# Patient Record
Sex: Male | Born: 1985
Health system: Southern US, Community
[De-identification: ages and names within clinical notes are randomized; demographics above are authoritative.]

## PROBLEM LIST (undated history)

## (undated) DIAGNOSIS — I10 Essential (primary) hypertension: Secondary | ICD-10-CM

## (undated) DIAGNOSIS — E669 Obesity, unspecified: Secondary | ICD-10-CM

## (undated) DIAGNOSIS — I428 Other cardiomyopathies: Secondary | ICD-10-CM

## (undated) DIAGNOSIS — G4733 Obstructive sleep apnea (adult) (pediatric): Secondary | ICD-10-CM

## (undated) DIAGNOSIS — Z720 Tobacco use: Secondary | ICD-10-CM

## (undated) DIAGNOSIS — F32A Depression, unspecified: Secondary | ICD-10-CM

## (undated) DIAGNOSIS — E785 Hyperlipidemia, unspecified: Secondary | ICD-10-CM

## (undated) HISTORY — DX: Obesity, unspecified: E66.9

## (undated) HISTORY — DX: Tobacco use: Z72.0

## (undated) HISTORY — DX: Other cardiomyopathies: I42.8

## (undated) HISTORY — DX: Hyperlipidemia, unspecified: E78.5

## (undated) HISTORY — PX: OTHER SURGICAL HISTORY: SHX169

## (undated) HISTORY — PX: NO PAST SURGERIES: SHX2092

---

## 2013-09-14 ENCOUNTER — Encounter (HOSPITAL_COMMUNITY): Payer: Self-pay | Admitting: Emergency Medicine

## 2013-09-14 ENCOUNTER — Emergency Department (HOSPITAL_COMMUNITY)
Admission: EM | Admit: 2013-09-14 | Discharge: 2013-09-14 | Disposition: A | Discharge status: Home / Self Care | Payer: Medicaid Other | Attending: Emergency Medicine | Admitting: Emergency Medicine

## 2013-09-14 DIAGNOSIS — Y9389 Activity, other specified: Secondary | ICD-10-CM | POA: Insufficient documentation

## 2013-09-14 DIAGNOSIS — Y92410 Unspecified street and highway as the place of occurrence of the external cause: Secondary | ICD-10-CM | POA: Insufficient documentation

## 2013-09-14 DIAGNOSIS — S335XXA Sprain of ligaments of lumbar spine, initial encounter: Secondary | ICD-10-CM | POA: Insufficient documentation

## 2013-09-14 DIAGNOSIS — F17200 Nicotine dependence, unspecified, uncomplicated: Secondary | ICD-10-CM | POA: Insufficient documentation

## 2013-09-14 DIAGNOSIS — S39012A Strain of muscle, fascia and tendon of lower back, initial encounter: Secondary | ICD-10-CM

## 2013-09-14 MED ORDER — IBUPROFEN 400 MG PO TABS
800.0000 mg | ORAL_TABLET | Freq: Once | ORAL | Status: AC
  Administered 2013-09-14: 800 mg via ORAL
  Filled 2013-09-14: qty 2
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
## 2013-09-14 NOTE — ED Provider Notes (Signed)
Medical screening examination/treatment/procedure(s) were performed by non-physician practitioner and as supervising physician I was immediately available for consultation/collaboration.   EKG Interpretation None       Juliet Rude. Rubin Payor, MD 09/14/13 2328
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
## 2013-11-21 ENCOUNTER — Emergency Department (HOSPITAL_COMMUNITY): Payer: Medicaid Other

## 2013-11-21 ENCOUNTER — Encounter (HOSPITAL_COMMUNITY): Payer: Self-pay | Admitting: Emergency Medicine

## 2013-11-21 ENCOUNTER — Emergency Department (HOSPITAL_COMMUNITY)
Admission: EM | Admit: 2013-11-21 | Discharge: 2013-11-21 | Disposition: A | Discharge status: Home / Self Care | Payer: Medicaid Other | Attending: Emergency Medicine | Admitting: Emergency Medicine

## 2013-11-21 DIAGNOSIS — W268XXA Contact with other sharp object(s), not elsewhere classified, initial encounter: Secondary | ICD-10-CM | POA: Diagnosis not present

## 2013-11-21 DIAGNOSIS — F17200 Nicotine dependence, unspecified, uncomplicated: Secondary | ICD-10-CM | POA: Diagnosis not present

## 2013-11-21 DIAGNOSIS — Y9289 Other specified places as the place of occurrence of the external cause: Secondary | ICD-10-CM | POA: Insufficient documentation

## 2013-11-21 DIAGNOSIS — S61209A Unspecified open wound of unspecified finger without damage to nail, initial encounter: Secondary | ICD-10-CM

## 2013-11-21 DIAGNOSIS — Y9339 Activity, other involving climbing, rappelling and jumping off: Secondary | ICD-10-CM | POA: Diagnosis not present

## 2013-11-21 DIAGNOSIS — Z23 Encounter for immunization: Secondary | ICD-10-CM | POA: Insufficient documentation

## 2013-11-21 MED ORDER — TETANUS-DIPHTH-ACELL PERTUSSIS 5-2.5-18.5 LF-MCG/0.5 IM SUSP
0.5000 mL | Freq: Once | INTRAMUSCULAR | Status: AC
  Administered 2013-11-21: 0.5 mL via INTRAMUSCULAR
  Filled 2013-11-21: qty 0.5

## 2013-11-21 MED ORDER — TRAMADOL HCL 50 MG PO TABS
50.0000 mg | ORAL_TABLET | Freq: Once | ORAL | Status: AC
  Administered 2013-11-21: 50 mg via ORAL
  Filled 2013-11-21: qty 1

## 2013-11-21 NOTE — Discharge Instructions (Signed)
You may take acetaminophen and ibuprofen as needed for pain. See below for further instructions.  °

## 2013-11-21 NOTE — ED Provider Notes (Signed)
CSN: [id number]     Arrival date & time 11/21/13  1410 History   First MD Initiated Contact with Patient 11/21/13 1510     Chief Complaint  Patient presents with  . Laceration     (Consider location/radiation/quality/duration/timing/severity/associated sxs/prior Treatment) HPI Pt is a 28yo male presenting to ED with complaints of pain and wound to distal left middle finger after trying to climb over a chain-linked fence around 1am this morning. Pain is sharp, constant, 10/10, worse with palpation. States bleeding was easily controlled. Wound was cleaned with peroxide which made pain worse.  Pt states his mother advised him to come to ED for tetanus shot.  Denies fever, n/v/d. Pt is unsure if any foreign bodies from fence are still in his finger.   History reviewed. No pertinent past medical history. History reviewed. No pertinent past surgical history. History reviewed. No pertinent family history. History  Substance Use Topics  . Smoking status: Current Every Day Smoker    Types: Cigarettes  . Smokeless tobacco: Not on file  . Alcohol Use: Yes     Comment: occ    Review of Systems  Constitutional: Negative for fever and chills.  Gastrointestinal: Negative for nausea and vomiting.  Skin: Positive for wound ( distal left middle finger). Negative for color change.  All other systems reviewed and are negative.     Allergies  Bee venom  Home Medications   Prior to Admission medications   Not on File   BP 156/108  Pulse 73  Temp(Src) 97.4 F (36.3 C) (Oral)  Resp 18  Ht 5\' 10"  (1.778 m)  Wt 283 lb (128.368 kg)  BMI 40.61 kg/m2  SpO2 99% Physical Exam  Nursing note and vitals reviewed. Constitutional: He is oriented to person, place, and time. He appears well-developed and well-nourished.  HENT:  Head: Normocephalic and atraumatic.  Eyes: EOM are normal.  Neck: Normal range of motion.  Cardiovascular: Normal rate.   Pulses:      Radial pulses are 2+ on the left  side.  Left hand: Cap refill <3 seconds  Pulmonary/Chest: Effort normal.  Musculoskeletal: Normal range of motion.  FROM left middle finger.  Neurological: He is alert and oriented to person, place, and time.  Skin: Skin is warm and dry. There is erythema.  Superficial skin avulsion to volar aspect distal part of left middle finger.  No active bleeding, mild edema with tenderness.   Psychiatric: He has a normal mood and affect. His behavior is normal.    ED Course  Procedures   The wound is cleansed, debrided of foreign material as much as possible, and dressed. The patient is alerted to watch for any signs of infection (redness, pus, pain, increased swelling or fever) and call if such occurs. Home wound care instructions are provided. Tetanus vaccination status reviewed: Tdap given in ED today  Labs Reviewc Labs Reviewed - No data to display  Imaging Review Dg Finger Middle Left  11/21/2013   CLINICAL DATA:  Finger laceration.  EXAM: LEFT MIDDLE FINGER 2+V  COMPARISON:  None.  FINDINGS: No acute osseous or joint abnormality.  No radiopaque foreign body.  IMPRESSION: No acute osseous or joint abnormality.  No radiopaque foreign body.   Electronically Signed   By: Leanna Battles M.D.   On: 11/21/2013 16:04     EKG Interpretation None      MDM   Final diagnoses:  Avulsion of skin of finger, initial encounter  Need for Tdap vaccination  Pt is a 28yo male presenting to ED for wound check and Tdap after cutting his left middle finger on chain fence around 1am this morning.  No active bleeding. FROM left middle finger. Mild edema with tenderness to area of superficial skin avulsion on volar aspect of finger. Will get plain films to ensure no foreign bodies.  Tdap given. Wound care provided. Pt's BP was elevated while in ED. Discussed with pt to f/u with PCP for recheck of BP next week. He may need to be started on BP medication. Return precautions provided. Pt verbalized  understanding and agreement with tx plan.     Junius Finnerrin O'Malley, PA-C 11/21/13 1657

## 2013-11-21 NOTE — ED Notes (Signed)
He was climbing a fence last night and cut the anterior tip of his L middle finger. He is worried he needs a tetanus shot

## 2013-11-23 NOTE — ED Provider Notes (Signed)
Medical screening examination/treatment/procedure(s) were performed by non-physician practitioner and as supervising physician I was immediately available for consultation/collaboration.   EKG Interpretation None        Audree Camel, MD 11/23/13 [phone]

## 2016-06-02 ENCOUNTER — Emergency Department (HOSPITAL_COMMUNITY)
Admission: EM | Admit: 2016-06-02 | Discharge: 2016-06-02 | Disposition: A | Discharge status: Home / Self Care | Payer: Medicaid Other | Attending: Emergency Medicine | Admitting: Emergency Medicine

## 2016-06-02 ENCOUNTER — Encounter (HOSPITAL_COMMUNITY): Payer: Self-pay | Admitting: Emergency Medicine

## 2016-06-02 DIAGNOSIS — F17210 Nicotine dependence, cigarettes, uncomplicated: Secondary | ICD-10-CM | POA: Insufficient documentation

## 2016-06-02 DIAGNOSIS — Z202 Contact with and (suspected) exposure to infections with a predominantly sexual mode of transmission: Secondary | ICD-10-CM | POA: Insufficient documentation

## 2016-06-02 LAB — RAPID HIV SCREEN (HIV 1/2 AB+AG)
HIV 1/2 Antibodies: NONREACTIVE
HIV-1 P24 Antigen - HIV24: NONREACTIVE

## 2016-06-02 NOTE — ED Notes (Signed)
Pt. Stated, I know I have high BP , pt. Does not take any medication for his BP

## 2016-06-02 NOTE — ED Provider Notes (Signed)
[facility]-EMERGENCY DEPT Provider Note   CSN: [id number] Arrival date & time: 06/02/16  1349     History   Chief Complaint Chief Complaint  Patient presents with  . Exposure to STD    HPI Dale Mcintosh is a 31 y.o. male. CC: STD exposure.  HPI:  Pts SO (male) was dx with genital herpes.  Sensation is a symptomatically. He has no discharge. No genital sores or blisters. No history of STDs. He requests testing for his herpes exposure.  History reviewed. No pertinent past medical history.  There are no active problems to display for this patient.   History reviewed. No pertinent surgical history.     Home Medications    Prior to Admission medications   Not on File    Family History No family history on file.  Social History Social History  Substance Use Topics  . Smoking status: Current Every Day Smoker    Types: Cigarettes  . Smokeless tobacco: Current User  . Alcohol use Yes     Comment: occ     Allergies   Bee venom   Review of Systems Review of Systems  Constitutional: Negative for appetite change, chills, diaphoresis, fatigue and fever.  HENT: Negative for mouth sores, sore throat and trouble swallowing.   Eyes: Negative for visual disturbance.  Respiratory: Negative for cough, chest tightness, shortness of breath and wheezing.   Cardiovascular: Negative for chest pain.  Gastrointestinal: Negative for abdominal distention, abdominal pain, diarrhea, nausea and vomiting.  Endocrine: Negative for polydipsia, polyphagia and polyuria.  Genitourinary: Negative for dysuria, frequency and hematuria.  Musculoskeletal: Negative for gait problem.  Skin: Negative for color change, pallor and rash.  Neurological: Negative for dizziness, syncope, light-headedness and headaches.  Hematological: Does not bruise/bleed easily.  Psychiatric/Behavioral: Negative for behavioral problems and confusion.     Physical Exam Updated Vital Signs BP (!) 155/107 (BP  Location: Right Arm)   Pulse 89   Temp 98.2 F (36.8 C) (Oral)   Resp 18   SpO2 97%   Physical Exam  Constitutional: He is oriented to person, place, and time. He appears well-developed and well-nourished. No distress.  HENT:  Head: Normocephalic.  Eyes: Conjunctivae are normal. Pupils are equal, round, and reactive to light. No scleral icterus.  Neck: Normal range of motion. Neck supple. No thyromegaly present.  Cardiovascular: Normal rate and regular rhythm.  Exam reveals no gallop and no friction rub.   No murmur heard. Pulmonary/Chest: Effort normal and breath sounds normal. No respiratory distress. He has no wheezes. He has no rales.  Abdominal: Soft. Bowel sounds are normal. He exhibits no distension. There is no tenderness. There is no rebound.  Musculoskeletal: Normal range of motion.  Neurological: He is alert and oriented to person, place, and time.  Skin: Skin is warm and dry. No rash noted.  Psychiatric: He has a normal mood and affect. His behavior is normal.     ED Treatments / Results  Labs (all labs ordered are listed, but only abnormal results are displayed) Labs Reviewed  HERPES SIMPLEX VIRUS(HSV) DNA BY PCR  HIV ANTIBODY (ROUTINE TESTING)  RAPID HIV SCREEN (HIV 1/2 AB+AG)  RPR  GC/CHLAMYDIA PROBE AMP () NOT AT [facility]    EKG  EKG Interpretation None       Radiology No results found.  Procedures Procedures (including critical care time)  Medications Ordered in ED Medications - No data to display   Initial Impression / Assessment and Plan / ED Course  I have reviewed the triage vital signs and the nursing notes.  Pertinent labs & imaging results that were available during my care of the patient were reviewed by me and considered in my medical decision making (see chart for details).     CG, Chlamydia, Herpes PCA, HIV, and RPR.  Follow-up my chart. He will be contact with any positive results.  Final Clinical Impressions(s) / ED  Diagnoses   Final diagnoses:  STD exposure    New Prescriptions New Prescriptions   No medications on file     Rolland Porter, MD 06/02/16 1658

## 2016-06-02 NOTE — ED Notes (Signed)
Called pt's name to obtain vital signs, no one answered. 

## 2016-06-02 NOTE — ED Notes (Signed)
PT unable to provide UA at this time... 

## 2016-06-02 NOTE — ED Triage Notes (Signed)
Pt. Stated, I don't have any symptoms , I just want to be tested for STD.  My girl tested positive for herpes.

## 2016-06-02 NOTE — Discharge Instructions (Signed)
You can look on "my chart" for your test results in 4-5 days. You will be contacted with any positive results needing attention or treatment. Always wear a condom to prevent spread/contraction of sexually transmitted infections.

## 2016-06-03 LAB — HERPES SIMPLEX VIRUS(HSV) DNA BY PCR
HSV 1 DNA: NEGATIVE
HSV 2 DNA: NEGATIVE

## 2016-06-03 LAB — HIV ANTIBODY (ROUTINE TESTING W REFLEX): HIV Screen 4th Generation wRfx: NONREACTIVE

## 2016-06-03 LAB — SYPHILIS: RPR W/REFLEX TO RPR TITER AND TREPONEMAL ANTIBODIES, TRADITIONAL SCREENING AND DIAGNOSIS ALGORITHM: RPR Ser Ql: NONREACTIVE

## 2016-06-12 ENCOUNTER — Emergency Department (HOSPITAL_COMMUNITY)
Admission: EM | Admit: 2016-06-12 | Discharge: 2016-06-12 | Disposition: A | Discharge status: Home / Self Care | Payer: Self-pay | Attending: Emergency Medicine | Admitting: Emergency Medicine

## 2016-06-12 ENCOUNTER — Encounter (HOSPITAL_COMMUNITY): Payer: Self-pay | Admitting: Emergency Medicine

## 2016-06-12 DIAGNOSIS — Y929 Unspecified place or not applicable: Secondary | ICD-10-CM | POA: Insufficient documentation

## 2016-06-12 DIAGNOSIS — S20229A Contusion of unspecified back wall of thorax, initial encounter: Secondary | ICD-10-CM

## 2016-06-12 DIAGNOSIS — Y999 Unspecified external cause status: Secondary | ICD-10-CM | POA: Insufficient documentation

## 2016-06-12 DIAGNOSIS — W01198A Fall on same level from slipping, tripping and stumbling with subsequent striking against other object, initial encounter: Secondary | ICD-10-CM | POA: Insufficient documentation

## 2016-06-12 DIAGNOSIS — Z79899 Other long term (current) drug therapy: Secondary | ICD-10-CM | POA: Insufficient documentation

## 2016-06-12 DIAGNOSIS — I10 Essential (primary) hypertension: Secondary | ICD-10-CM | POA: Insufficient documentation

## 2016-06-12 DIAGNOSIS — F17210 Nicotine dependence, cigarettes, uncomplicated: Secondary | ICD-10-CM | POA: Insufficient documentation

## 2016-06-12 DIAGNOSIS — W19XXXA Unspecified fall, initial encounter: Secondary | ICD-10-CM

## 2016-06-12 DIAGNOSIS — S300XXA Contusion of lower back and pelvis, initial encounter: Secondary | ICD-10-CM | POA: Insufficient documentation

## 2016-06-12 DIAGNOSIS — Y9301 Activity, walking, marching and hiking: Secondary | ICD-10-CM | POA: Insufficient documentation

## 2016-06-12 HISTORY — DX: Essential (primary) hypertension: I10

## 2016-06-12 MED ORDER — METHOCARBAMOL 500 MG PO TABS: 500.0000 mg | ORAL_TABLET | Freq: Two times a day (BID) | ORAL | 0 refills | Status: DC

## 2016-06-12 NOTE — ED Provider Notes (Signed)
[facility]-EMERGENCY DEPT Provider Note   CSN: [id number] Arrival date & time: 06/12/16  1215  By signing my name below, I, Majel Homer, attest that this documentation has been prepared under the direction and in the presence of Sharen Heck, PA-C . Electronically Signed: Majel Homer, Scribe. 06/12/2016. 1:26 PM.  History   Chief Complaint Chief Complaint  Patient presents with  . Back Pain   The history is provided by the patient. No language interpreter was used.   HPI Comments: Dale Mcintosh is a 31 y.o. male with PMHx of HTN, who presents to the Emergency Department complaining of acute onset lower back pain s/p a mechanical fall that occurred this morning. Pt reports he was walking down the steps of his porch this morning when he suddenly slipped and fell backwards, causing him to strike his lower back on the bottom step. He notes he was able to stand up on his own and ambulate after this incident. He denies hitting his head on anything, loss of consciousness, any numbness or weakness in his extremities, and bilateral hip pain. No previous back injuries or surgeries. No anticoagulants.   Past Medical History:  Diagnosis Date  . Hypertension    There are no active problems to display for this patient.  History reviewed. No pertinent surgical history.  Home Medications    Prior to Admission medications   Medication Sig Start Date End Date Taking? Authorizing Provider  methocarbamol (ROBAXIN) 500 MG tablet Take 1 tablet (500 mg total) by mouth 2 (two) times daily. 06/12/16   Liberty Handy, PA-C    Family History History reviewed. No pertinent family history.  Social History Social History  Substance Use Topics  . Smoking status: Current Every Day Smoker    Packs/day: 0.50    Types: Cigarettes  . Smokeless tobacco: Current User  . Alcohol use Yes     Comment: occ   Allergies   Bee venom  Review of Systems Review of Systems  Musculoskeletal: Positive for back pain.  Negative for arthralgias.  Neurological: Negative for weakness and numbness.   Physical Exam Updated Vital Signs BP 174/94 (BP Location: Left Arm)   Pulse 89   Temp 98 F (36.7 C) (Oral)   Resp 18   Ht 5' 7.5" (1.715 m)   Wt 298 lb (135.2 kg)   SpO2 98%   BMI 45.98 kg/m   Physical Exam  Constitutional: He is oriented to person, place, and time. He appears well-developed and well-nourished.  HENT:  Head: Normocephalic.  Eyes: EOM are normal.  Neck: Normal range of motion.  Pulmonary/Chest: Effort normal.  Abdominal: He exhibits no distension.  Musculoskeletal: Normal range of motion. He exhibits tenderness.  Gait normal.  Full active CTL spine ROM including flexion, extension, lateral bend and rotation. No CTL spine midline tenderness.  There is lumbar paraspinal tenderness. SI joints and sciatic notch non tender.  Full passive hip, knee and ankle ROM bilaterally.  Negative SLR. Negative Faber. Negative Stinchfield test.  Bilateral lower paraspinal muscle tenderness.   Neurological: He is alert and oriented to person, place, and time.  Psychiatric: He has a normal mood and affect.  Nursing note and vitals reviewed.  ED Treatments / Results  DIAGNOSTIC STUDIES:  Oxygen Saturation is 98% on RA, normal by my interpretation.    COORDINATION OF CARE:  1:25 PM Discussed treatment plan with pt at bedside and pt agreed to plan.  Labs (all labs ordered are listed, but only abnormal results are displayed)  Labs Reviewed - No data to display  EKG  EKG Interpretation None       Radiology No results found.  Procedures Procedures (including critical care time)  Medications Ordered in ED Medications - No data to display  Initial Impression / Assessment and Plan / ED Course  I have reviewed the triage vital signs and the nursing notes.  Pertinent labs & imaging results that were available during my care of the patient were reviewed by me and considered in my medical  decision making (see chart for details).     31 yo male with no pertinent pmh presents with acute onset lumbar back pain s/p low risk mechanical fall this morning.  Pt slipped off second step and hit his lower back on first step of his porch.  No LOC, no head trauma.  On exam there is paraspinal lumbar tenderness most consistent with contusion.  No CTL spine midline tenderness. Neurological exam of lower extremities normal. Pt ambulatory without antalgic gait in ED with normal ROM of lower extremities.  No imaging indicated at this time.  Will d/c with symptomatic tx.  ED return precautions given.   I personally performed the services described in this documentation, which was scribed in my presence. The recorded information has been reviewed and is accurate.   Final Clinical Impressions(s) / ED Diagnoses   Final diagnoses:  Fall, initial encounter  Contusion of back, unspecified laterality, initial encounter    New Prescriptions New Prescriptions   METHOCARBAMOL (ROBAXIN) 500 MG TABLET    Take 1 tablet (500 mg total) by mouth 2 (two) times daily.     Liberty Handy, PA-C 06/12/16 1350    Gerhard Munch, MD 06/13/16 1426

## 2016-06-12 NOTE — ED Triage Notes (Signed)
Patient reports he slipped and fell and injured his left lower back.  Denies head injury, LOC.

## 2016-06-12 NOTE — Discharge Instructions (Signed)
Your back pain is most likely due to a contusion from your recent fall.  Please read attached information on contusion and back pain.   You may take 650 mg tylenol + 600 mg of ibuprofen three times a day for pain and inflammation for the next 3-4 days.  Please ice your back and rest for the next 2 days, on day 3 you may start heating your back and doing mild back stretches and massage.  You have been prescribed robaxin (a muscle relaxer) which you can start using if you notice your back is tight or if you're having back spasms.   Your symptoms should resolve or at least improve in the next 5-7 days.  Return to the emergency department if you notice numbness, tingling or weakness in your lower extremities.

## 2016-09-19 ENCOUNTER — Encounter (HOSPITAL_COMMUNITY): Payer: Self-pay | Admitting: Emergency Medicine

## 2016-09-19 ENCOUNTER — Emergency Department (HOSPITAL_COMMUNITY)
Admission: EM | Admit: 2016-09-19 | Discharge: 2016-09-19 | Disposition: A | Discharge status: Home / Self Care | Payer: Worker's Compensation | Attending: Emergency Medicine | Admitting: Emergency Medicine

## 2016-09-19 DIAGNOSIS — Y990 Civilian activity done for income or pay: Secondary | ICD-10-CM | POA: Diagnosis not present

## 2016-09-19 DIAGNOSIS — Y929 Unspecified place or not applicable: Secondary | ICD-10-CM | POA: Diagnosis not present

## 2016-09-19 DIAGNOSIS — W208XXA Other cause of strike by thrown, projected or falling object, initial encounter: Secondary | ICD-10-CM | POA: Insufficient documentation

## 2016-09-19 DIAGNOSIS — I10 Essential (primary) hypertension: Secondary | ICD-10-CM

## 2016-09-19 DIAGNOSIS — Y939 Activity, unspecified: Secondary | ICD-10-CM | POA: Insufficient documentation

## 2016-09-19 DIAGNOSIS — S0990XA Unspecified injury of head, initial encounter: Secondary | ICD-10-CM | POA: Diagnosis not present

## 2016-09-19 DIAGNOSIS — F17210 Nicotine dependence, cigarettes, uncomplicated: Secondary | ICD-10-CM | POA: Diagnosis not present

## 2016-09-19 LAB — I-STAT CHEM 8, ED
BUN: 20 mg/dL (ref 6–20)
Calcium, Ion: 1.22 mmol/L (ref 1.15–1.40)
Chloride: 103 mmol/L (ref 101–111)
Creatinine, Ser: 1.1 mg/dL (ref 0.61–1.24)
Glucose, Bld: 99 mg/dL (ref 65–99)
HEMATOCRIT: 43 % (ref 39.0–52.0)
HEMOGLOBIN: 14.6 g/dL (ref 13.0–17.0)
Potassium: 3.7 mmol/L (ref 3.5–5.1)
SODIUM: 142 mmol/L (ref 135–145)
TCO2: 29 mmol/L (ref 0–100)

## 2016-09-19 MED ORDER — AMLODIPINE BESYLATE 5 MG PO TABS: 5.0000 mg | ORAL_TABLET | Freq: Every day | ORAL | 0 refills | Status: DC

## 2016-09-19 MED ORDER — CLONIDINE HCL 0.1 MG PO TABS
0.2000 mg | ORAL_TABLET | Freq: Once | ORAL | Status: AC
  Administered 2016-09-19: 0.2 mg via ORAL
  Filled 2016-09-19: qty 2

## 2016-09-19 NOTE — ED Provider Notes (Signed)
[facility] Provider Note   CSN: [id number] Arrival date & time: 09/19/16  1329     History   Chief Complaint Chief Complaint  Patient presents with  . Head Injury  . Nausea    HPI Antwone Cogbill is a 31 y.o. male.  31 year old male who is here after having boxes at work fall on top of his head. No loss of consciousness. He does not take any blood thinners. He denies any neck pain. Did have some nausea originally but has not had any vomiting. No ataxia. Denied any swelling to the top of his head with a boxes hit it. Was encouraged to come here by his recent employment.      Past Medical History:  Diagnosis Date  . Hypertension     There are no active problems to display for this patient.   History reviewed. No pertinent surgical history.     Home Medications    Prior to Admission medications   Medication Sig Start Date End Date Taking? Authorizing Provider  methocarbamol (ROBAXIN) 500 MG tablet Take 1 tablet (500 mg total) by mouth 2 (two) times daily. Patient not taking: Reported on 09/19/2016 06/12/16   Liberty Handy, PA-C    Family History No family history on file.  Social History Social History  Substance Use Topics  . Smoking status: Current Every Day Smoker    Packs/day: 0.50    Types: Cigarettes  . Smokeless tobacco: Current User  . Alcohol use Yes     Comment: occ     Allergies   Bee venom   Review of Systems Review of Systems  All other systems reviewed and are negative.    Physical Exam Updated Vital Signs BP (!) 203/148 (BP Location: Left Arm)   Pulse 69   Temp 98.3 F (36.8 C) (Oral)   Resp 18   Ht 5' 10.5" (1.791 m)   Wt 299 lb 3.2 oz (135.7 kg)   SpO2 98%   BMI 42.32 kg/m   Physical Exam  Constitutional: He is oriented to person, place, and time. He appears well-developed and well-nourished.  Non-toxic appearance. No distress.  HENT:  Head: Normocephalic and atraumatic.  Eyes: Conjunctivae, EOM and lids  are normal. Pupils are equal, round, and reactive to light.  Neck: Normal range of motion. Neck supple. No tracheal deviation present. No thyroid mass present.  Cardiovascular: Normal rate, regular rhythm and normal heart sounds.  Exam reveals no gallop.   No murmur heard. Pulmonary/Chest: Effort normal and breath sounds normal. No stridor. No respiratory distress. He has no decreased breath sounds. He has no wheezes. He has no rhonchi. He has no rales.  Abdominal: Soft. Normal appearance and bowel sounds are normal. He exhibits no distension. There is no tenderness. There is no rebound and no CVA tenderness.  Musculoskeletal: Normal range of motion. He exhibits no edema or tenderness.  Neurological: He is alert and oriented to person, place, and time. He has normal strength. No cranial nerve deficit or sensory deficit. Coordination and gait normal. GCS eye subscore is 4. GCS verbal subscore is 5. GCS motor subscore is 6.  Skin: Skin is warm and dry. No abrasion and no rash noted.  Psychiatric: He has a normal mood and affect. His speech is normal and behavior is normal.  Nursing note and vitals reviewed.    ED Treatments / Results  Labs (all labs ordered are listed, but only abnormal results are displayed) Labs Reviewed - No data to display  EKG  EKG Interpretation None       Radiology No results found.  Procedures Procedures (including critical care time)  Medications Ordered in ED Medications - No data to display   Initial Impression / Assessment and Plan / ED Course  I have reviewed the triage vital signs and the nursing notes.  Pertinent labs & imaging results that were available during my care of the patient were reviewed by me and considered in my medical decision making (see chart for details).    Patient has no signs of head injury at this time. No indication for intracranial imaging. Patient's blood pressure noted here and treated with clonidine. Get improvement  with it and will place patient on Norvasc and give referral for primary care  Final Clinical Impressions(s) / ED Diagnoses   Final diagnoses:  None    New Prescriptions New Prescriptions   No medications on file     Lorre Nick, MD 09/19/16 2103

## 2016-09-19 NOTE — ED Triage Notes (Signed)
Patient here with complaints of head injury at work. Reports that a box fell on his head, immediate nausea. No vomiting. No LOC.

## 2016-12-28 ENCOUNTER — Encounter (HOSPITAL_COMMUNITY): Payer: Self-pay | Admitting: Emergency Medicine

## 2016-12-28 ENCOUNTER — Emergency Department (HOSPITAL_COMMUNITY)
Admission: EM | Admit: 2016-12-28 | Discharge: 2016-12-28 | Disposition: A | Discharge status: Home / Self Care | Payer: Self-pay | Attending: Emergency Medicine | Admitting: Emergency Medicine

## 2016-12-28 ENCOUNTER — Emergency Department (HOSPITAL_COMMUNITY): Payer: Self-pay

## 2016-12-28 DIAGNOSIS — I10 Essential (primary) hypertension: Secondary | ICD-10-CM | POA: Insufficient documentation

## 2016-12-28 DIAGNOSIS — F17210 Nicotine dependence, cigarettes, uncomplicated: Secondary | ICD-10-CM | POA: Insufficient documentation

## 2016-12-28 DIAGNOSIS — R0789 Other chest pain: Secondary | ICD-10-CM

## 2016-12-28 DIAGNOSIS — I4581 Long QT syndrome: Secondary | ICD-10-CM | POA: Insufficient documentation

## 2016-12-28 LAB — CBC
HEMATOCRIT: 41.5 % (ref 39.0–52.0)
Hemoglobin: 13.5 g/dL (ref 13.0–17.0)
MCH: 27.8 pg (ref 26.0–34.0)
MCHC: 32.5 g/dL (ref 30.0–36.0)
MCV: 85.4 fL (ref 78.0–100.0)
PLATELETS: 198 10*3/uL (ref 150–400)
RBC: 4.86 MIL/uL (ref 4.22–5.81)
RDW: 14.9 % (ref 11.5–15.5)
WBC: 9 10*3/uL (ref 4.0–10.5)

## 2016-12-28 LAB — BASIC METABOLIC PANEL
Anion gap: 7 (ref 5–15)
BUN: 18 mg/dL (ref 6–20)
CO2: 27 mmol/L (ref 22–32)
CREATININE: 1.02 mg/dL (ref 0.61–1.24)
Calcium: 9 mg/dL (ref 8.9–10.3)
Chloride: 104 mmol/L (ref 101–111)
GFR calc Af Amer: >60 mL/min (ref >60)
GFR calc non Af Amer: >60 mL/min (ref >60)
Glucose, Bld: 95 mg/dL (ref 65–99)
Potassium: 3.7 mmol/L (ref 3.5–5.1)
SODIUM: 138 mmol/L (ref 135–145)

## 2016-12-28 LAB — POCT I-STAT TROPONIN I: Troponin i, poc: 0.01 ng/mL (ref 0.00–0.08)

## 2016-12-28 MED ORDER — AMLODIPINE BESYLATE 5 MG PO TABS: 5.0000 mg | ORAL_TABLET | Freq: Every day | ORAL | 0 refills | Status: DC
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
## 2016-12-28 NOTE — ED Notes (Signed)
Bed: WA20 Expected date:  Expected time:  Means of arrival:  Comments: 

## 2016-12-28 NOTE — ED Triage Notes (Signed)
Pt reports mid CP since this am. No SOB or dizziness. Pt reports last time this happened he had LOC. No LOC today.

## 2016-12-28 NOTE — ED Provider Notes (Signed)
[facility] Provider Note   CSN: [id number] Arrival date & time: 12/28/16  1025     History   Chief Complaint Chief Complaint  Patient presents with  . Chest Pain    HPI Dale Mcintosh is a 31 y.o. male with a history of hypertension (not taking medications) who presents for evaluation of chest pain.  He reports that his chest pain was present for "a few hours" and gradually got better.  His chest pain occurred while he was laying in bed.  He could not find anything to make it better or worse.  He had a similar episode about one month ago when he felt the same chest pain for "a few seconds" then had a syncopal event and woke up on the floor after about 30 seconds.  He reports that while waiting his chest pain gradually got better.  He reports that he has never had an EKG before and he did not seek treatment when he had his previous syncopal event.   HPI  Past Medical History:  Diagnosis Date  . Hypertension     There are no active problems to display for this patient.   History reviewed. No pertinent surgical history.     Home Medications    Prior to Admission medications   Medication Sig Start Date End Date Taking? Authorizing Provider  aspirin-acetaminophen-caffeine (EXCEDRIN MIGRAINE) [phone] MG tablet Take 2 tablets by mouth every 6 (six) hours as needed for headache.   Yes [provider]  amLODipine (NORVASC) 5 MG tablet Take 1 tablet (5 mg total) by mouth daily. 12/28/16   Cristina Gong, PA-C  methocarbamol (ROBAXIN) 500 MG tablet Take 1 tablet (500 mg total) by mouth 2 (two) times daily. Patient not taking: Reported on 09/19/2016 06/12/16   Liberty Handy, PA-C    Family History History reviewed. No pertinent family history.  Social History Social History  Substance Use Topics  . Smoking status: Current Every Day Smoker    Packs/day: 0.50    Types: Cigarettes  . Smokeless tobacco: Current User  . Alcohol use Yes     Comment: occ       Allergies   Bee venom   Review of Systems Review of Systems  Constitutional: Negative for chills and fever.  HENT: Negative for ear pain and sore throat.   Eyes: Negative for pain and visual disturbance.  Respiratory: Negative for cough, chest tightness and shortness of breath.   Cardiovascular: Positive for chest pain (Has resolved). Negative for palpitations.  Gastrointestinal: Negative for abdominal pain and vomiting.  Genitourinary: Negative for dysuria and hematuria.  Musculoskeletal: Negative for arthralgias and back pain.  Skin: Negative for color change and rash.  Neurological: Negative for seizures, syncope, light-headedness and headaches.  All other systems reviewed and are negative.    Physical Exam Updated Vital Signs BP (!) 157/107 (BP Location: Left Arm)   Pulse 73   Temp 98.1 F (36.7 C) (Oral)   Resp 14   SpO2 100%   Physical Exam  Constitutional: He appears well-developed and well-nourished. No distress.  HENT:  Head: Normocephalic and atraumatic.  Eyes: Conjunctivae are normal. No scleral icterus.  Neck: Normal range of motion. No JVD present. No tracheal deviation present.  Cardiovascular: Normal rate, regular rhythm, S1 normal, S2 normal, normal heart sounds, intact distal pulses and normal pulses.  Exam reveals no gallop and no friction rub.   No murmur heard. Pulmonary/Chest: Effort normal and breath sounds normal. No stridor. No respiratory  distress. He has no wheezes. He has no rales. He exhibits no tenderness.  Abdominal: Soft. Bowel sounds are normal. He exhibits no distension. There is no tenderness.  Musculoskeletal: He exhibits no edema or deformity.  Neurological: He is alert. He exhibits normal muscle tone.  Skin: Skin is warm and dry. He is not diaphoretic.  Psychiatric: He has a normal mood and affect. His behavior is normal.  Nursing note and vitals reviewed.    ED Treatments / Results  Labs (all labs ordered are listed, but  only abnormal results are displayed) Labs Reviewed  BASIC METABOLIC PANEL  CBC  I-STAT TROPONIN, ED  POCT I-STAT TROPONIN I    EKG  EKG Interpretation  Date/Time:  Saturday December 28 2016 10:50:19 EDT Ventricular Rate:  88 PR Interval:    QRS Duration: 88 QT Interval:  415 QTC Calculation: 503 R Axis:   48 Text Interpretation:  Sinus rhythm Probable left atrial enlargement Prolonged QT interval No old tracing to compare Confirmed by Doug Sou [phone]) on 12/28/2016 10:52:32 AM Also confirmed by Doug Sou [phone]), editor Misty Stanley [phone])  on 12/28/2016 11:30:14 AM       Radiology Dg Chest 2 View  Result Date: 12/28/2016 CLINICAL DATA:  Acute onset chest pain this morning.  Hypertension. EXAM: CHEST  2 VIEW COMPARISON:  None. FINDINGS: The heart size and mediastinal contours are within normal limits. Both lungs are clear. The visualized skeletal structures are unremarkable. IMPRESSION: Negative.  No active cardiopulmonary disease. Electronically Signed   By: Myles Rosenthal M.D.   On: 12/28/2016 11:57    Procedures Procedures (including critical care time)  Medications Ordered in ED Medications - No data to display   Initial Impression / Assessment and Plan / ED Course  I have reviewed the triage vital signs and the nursing notes.  Pertinent labs & imaging results that were available during my care of the patient were reviewed by me and considered in my medical decision making (see chart for details).    Patient is to be discharged with recommendation to follow up with Cardiology in regards to today's hospital visit. Chest pain is not likely of cardiac or pulmonary etiology d/t presentation, PERC negative, VSS, no tracheal deviation, no JVD or new murmur, RRR, breath sounds equal bilaterally, EKG without acute abnormalities, Long QT syndrome noted, negative troponin, and negative CXR. Pt has been advised to return to the ED if CP Recurs. Pt appears reliable  for follow up and is agreeable to discharge.  Patient was educated about long QT, medications to avoid, and what the diagnoses is.  He was also educated about HTN and the need to take antihypertensives as rx. Patient was given resource list and instructed to contact wellness center for follow-up appointment.  Case has been discussed with Dr. Rubin Payor who agrees with the above plan to discharge.     Final Clinical Impressions(s) / ED Diagnoses   Final diagnoses:  Essential hypertension  Long Q-T syndrome  Atypical chest pain    New Prescriptions Discharge Medication List as of 12/28/2016  5:15 PM       Cristina Gong, PA-C 12/28/16 2247    Benjiman Core, MD 01/04/17 0010

## 2016-12-28 NOTE — ED Notes (Signed)
EKG completed/documented in triage. [employer]

## 2017-01-30 ENCOUNTER — Encounter: Payer: Self-pay | Admitting: Internal Medicine

## 2017-01-30 ENCOUNTER — Ambulatory Visit (INDEPENDENT_AMBULATORY_CARE_PROVIDER_SITE_OTHER): Payer: Self-pay | Admitting: Internal Medicine

## 2017-01-30 VITALS — BP 178/122 | HR 75 | Ht 70.5 in | Wt 296.4 lb

## 2017-01-30 DIAGNOSIS — R51 Headache: Secondary | ICD-10-CM

## 2017-01-30 DIAGNOSIS — R0683 Snoring: Secondary | ICD-10-CM

## 2017-01-30 DIAGNOSIS — Z79899 Other long term (current) drug therapy: Secondary | ICD-10-CM

## 2017-01-30 DIAGNOSIS — G4719 Other hypersomnia: Secondary | ICD-10-CM

## 2017-01-30 DIAGNOSIS — R519 Headache, unspecified: Secondary | ICD-10-CM

## 2017-01-30 DIAGNOSIS — I10 Essential (primary) hypertension: Secondary | ICD-10-CM

## 2017-01-30 MED ORDER — IRBESARTAN-HYDROCHLOROTHIAZIDE 150-12.5 MG PO TABS: 1.0000 | ORAL_TABLET | Freq: Every day | ORAL | 5 refills | Status: DC

## 2017-01-30 MED ORDER — AMLODIPINE BESYLATE 10 MG PO TABS: 10.0000 mg | ORAL_TABLET | Freq: Every day | ORAL | 5 refills | Status: DC

## 2017-01-30 NOTE — Patient Instructions (Addendum)
Your physician has recommended you make the following change in your medication:  -- increase amlodipine to 10mg  daily -- start irbesartan-hctz 150/12.5mg  once daily  Your physician recommends that you return for lab work in: ONE WEEK (BMET)  Dr. Rennis Golden has requested that you schedule an appointment with one of our clinical pharmacists for a blood pressure check appointment within the next 2-3weeks.  -- if you monitor your blood pressure (BP) at home, please bring your BP cuff and your BP readings with you to this appointment -- please check your BP no more than twice daily, after you have been sitting/resting for 5-10 minutes, at least 1 hour after taking your BP medications  Your physician has recommended that you have a sleep study @ [facility]. This test records several body functions during sleep, including: brain activity, eye movement, oxygen and carbon dioxide blood levels, heart rate and rhythm, breathing rate and rhythm, the flow of air through your mouth and nose, snoring, body muscle movements, and chest and belly movement.  Your physician recommends that you schedule a follow-up appointment in 4-6 weeks with Dr. Rennis Golden.

## 2017-01-31 NOTE — Progress Notes (Signed)
OFFICE NOTE  Chief Complaint:  Chest pain  Primary Care Physician: Patient, No Pcp Per  HPI:  Dale Mcintosh is a 31 y.o. male with a past medial history significant for hypertension. He was seen in the emergency department the end of August for hypertension and chest pain. He was not on medications for his hypertension. He also said that he had a syncopal event, but this is questionable. He was noted to have a long QTC of over 500 ms in the emergency department however repeat EKG today shows a QTC of 422 ms. He denies any palpitations, tachycardia or symptoms of arrhythmia. Blood pressure today however is extremely elevated 178/122. He was a placed on amlodipine 5 mg daily by his primary care provider which she reports compliance with. He denies any further chest pain. He does report excessive daytime sleepiness, snoring and nonrestorative sleep.  PMHx:  Past Medical History:  Diagnosis Date  . Hypertension     History reviewed. No pertinent surgical history.  FAMHx:  Family History  Problem Relation Age of Onset  . Hypertension Father     SOCHx:   reports that he has been smoking Cigarettes.  He has been smoking about 0.50 packs per day. He uses smokeless tobacco. He reports that he drinks alcohol. He reports that he does not use drugs.  ALLERGIES:  Allergies  Allergen Reactions  . Bee Venom Anaphylaxis    ROS: Pertinent items noted in HPI and remainder of comprehensive ROS otherwise negative.  HOME MEDS: Current Outpatient Prescriptions on File Prior to Visit  Medication Sig Dispense Refill  . aspirin-acetaminophen-caffeine (EXCEDRIN MIGRAINE) 250-250-65 MG tablet Take 2 tablets by mouth every 6 (six) hours as needed for headache.     No current facility-administered medications on file prior to visit.     LABS/IMAGING: No results found for this or any previous visit (from the past 48 hour(s)). No results found.  LIPID PANEL: No results found for: CHOL, TRIG,  HDL, CHOLHDL, VLDL, LDLCALC, LDLDIRECT   WEIGHTS: Wt Readings from Last 3 Encounters:  01/30/17 296 lb 6.4 oz (134.4 kg)  09/19/16 299 lb 3.2 oz (135.7 kg)  06/12/16 298 lb (135.2 kg)    VITALS: BP (!) 178/122   Pulse 75   Ht 5' 10.5" (1.791 m)   Wt 296 lb 6.4 oz (134.4 kg)   BMI 41.93 kg/m   EXAM: General appearance: alert and no distress Neck: no carotid bruit, no JVD and thyroid not enlarged, symmetric, no tenderness/mass/nodules Lungs: clear to auscultation bilaterally Heart: regular rate and rhythm, S1, S2 normal, no murmur, click, rub or gallop Abdomen: soft, non-tender; bowel sounds normal; no masses,  no organomegaly Extremities: extremities normal, atraumatic, no cyanosis or edema Pulses: 2+ and symmetric Skin: Skin color, texture, turgor normal. No rashes or lesions Neurologic: Grossly normal Psych: Pleasant  EKG: Normal sinus rhythm at 75, voltage criteria for LVH, nonspecific T-wave changes - personally reviewed  ASSESSMENT: 1. Malignant hypertension 2. Syncope 3. Long QTc  PLAN: 1.   Mr. Iorio had a questionable syncopal event in the setting of malignant hypertension. Blood pressure remains very elevated. QTc was prolonged, however, it is normal today. I do not feel this represents a congenital long QTc syndrome, rather it was acquired. There is no evidence to suggest he had an arrhyhtmia. The main issue is ongoing significant hypertension. Will plan to increase amlodipine to 10 mg daily and irbesartan/hctz 150/12.5 mg daily. Check BMET in 1 week and will have a hypertension  follow-up visit in 2 weeks. I will see him back in 4-6 weeks.  Chrystie Nose, MD, [facility]  [employer] [employer]  [facility]  Attending Cardiologist  Direct Dial: [phone]  Fax: [phone]  Website:  [email or web address] Nicely Cala Kruckenberg 01/31/2017, 5:13 PM

## 2017-02-01 DIAGNOSIS — R0683 Snoring: Secondary | ICD-10-CM | POA: Insufficient documentation

## 2017-02-01 DIAGNOSIS — I161 Hypertensive emergency: Secondary | ICD-10-CM | POA: Insufficient documentation

## 2017-02-01 DIAGNOSIS — G4719 Other hypersomnia: Secondary | ICD-10-CM | POA: Insufficient documentation

## 2017-02-01 DIAGNOSIS — R519 Headache, unspecified: Secondary | ICD-10-CM | POA: Insufficient documentation

## 2017-02-01 DIAGNOSIS — R51 Headache: Secondary | ICD-10-CM

## 2017-02-07 ENCOUNTER — Telehealth: Payer: Self-pay | Admitting: Internal Medicine

## 2017-02-07 NOTE — Telephone Encounter (Signed)
Attempted to complete PA for irbesartan-hctz via [email or web address] and was unable to do so. Called # provided which was to patient's worker's comp insurance. She states pharmacy most likely ran the Rx to wrong insurance.   Called [facility] to notify them that patient's Rx will need to ran w/o insurance, as patient's only insurance listed in worker's comp. Pharmacy staff will contact patient to see if patient any coupons or another insurance

## 2017-02-19 NOTE — Progress Notes (Deleted)
Patient ID: Dale Mcintosh                 DOB: 06/12/1985                      MRN: [id number]     HPI: Dale Mcintosh is a 31 y.o. male referred by Dr. Rennis Golden to HTN clinic. PMH significant for hypertension. Tobacco abuse. Dr Rennis Golden increased amlodipine for 5mg  daily to 10mg  daily during last office visit on 01/30/2017 and added irbesartan/HCTZ 150/12.5mg  daily yo therapy.Patient was to repeat BMET after last office visit but no blood work completed yet.   Current HTN meds:  amlodipine 10mg  daily Irbesartan/HCTZ 150/12.5mg  daily  Previously tried:  Amlodipine 5mg   BP goal: <130/80  Family History: hypertension from father   Social History: smoking about 0.50 packs per day. He uses smokeless tobacco. He reports that he drinks alcohol. Denies use of illicit drug.  Diet:   Exercise:   Home BP readings:   Wt Readings from Last 3 Encounters:  01/30/17 296 lb 6.4 oz (134.4 kg)  09/19/16 299 lb 3.2 oz (135.7 kg)  06/12/16 298 lb (135.2 kg)   BP Readings from Last 3 Encounters:  01/30/17 (!) 178/122  12/28/16 (!) 157/107  09/19/16 (!) 158/109   Pulse Readings from Last 3 Encounters:  01/30/17 75  12/28/16 73  09/19/16 61    Renal function: CrCl cannot be calculated (Patient's most recent lab result is older than the maximum 21 days allowed.).  Past Medical History:  Diagnosis Date  . Hypertension     Current Outpatient Prescriptions on File Prior to Visit  Medication Sig Dispense Refill  . amLODipine (NORVASC) 10 MG tablet Take 1 tablet (10 mg total) by mouth daily. 30 tablet 5  . aspirin-acetaminophen-caffeine (EXCEDRIN MIGRAINE) 250-250-65 MG tablet Take 2 tablets by mouth every 6 (six) hours as needed for headache.    . irbesartan-hydrochlorothiazide (AVALIDE) 150-12.5 MG tablet Take 1 tablet by mouth daily. 30 tablet 5   No current facility-administered medications on file prior to visit.     Allergies  Allergen Reactions  . Bee Venom Anaphylaxis    There were  no vitals taken for this visit.  No problem-specific Assessment & Plan notes found for this encounter.   Kaylyn Garrow Rodriguez-Guzman PharmD, BCPS, CPP [facility] [address] [place] [address] 02/19/2017 8:59 PM

## 2017-02-20 ENCOUNTER — Ambulatory Visit: Payer: Self-pay

## 2017-03-07 ENCOUNTER — Ambulatory Visit: Payer: Self-pay | Admitting: Internal Medicine

## 2017-03-16 ENCOUNTER — Encounter (HOSPITAL_BASED_OUTPATIENT_CLINIC_OR_DEPARTMENT_OTHER): Payer: Self-pay

## 2017-08-25 ENCOUNTER — Emergency Department (HOSPITAL_COMMUNITY): Payer: Self-pay

## 2017-08-25 ENCOUNTER — Encounter (HOSPITAL_COMMUNITY): Payer: Self-pay | Admitting: Emergency Medicine

## 2017-08-25 ENCOUNTER — Emergency Department (HOSPITAL_COMMUNITY)
Admission: EM | Admit: 2017-08-25 | Discharge: 2017-08-25 | Disposition: A | Discharge status: Home / Self Care | Payer: Self-pay | Attending: Emergency Medicine | Admitting: Emergency Medicine

## 2017-08-25 DIAGNOSIS — I10 Essential (primary) hypertension: Secondary | ICD-10-CM | POA: Insufficient documentation

## 2017-08-25 DIAGNOSIS — Z7982 Long term (current) use of aspirin: Secondary | ICD-10-CM | POA: Insufficient documentation

## 2017-08-25 DIAGNOSIS — F17210 Nicotine dependence, cigarettes, uncomplicated: Secondary | ICD-10-CM | POA: Insufficient documentation

## 2017-08-25 DIAGNOSIS — M25511 Pain in right shoulder: Secondary | ICD-10-CM | POA: Insufficient documentation

## 2017-08-25 MED ORDER — METHOCARBAMOL 500 MG PO TABS: 500.0000 mg | ORAL_TABLET | Freq: Two times a day (BID) | ORAL | 0 refills | Status: DC

## 2017-08-25 MED ORDER — AMLODIPINE BESYLATE 10 MG PO TABS: 10.0000 mg | ORAL_TABLET | Freq: Every day | ORAL | 5 refills | Status: DC
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
## 2017-08-25 NOTE — Discharge Instructions (Addendum)
Please read attached information. If you experience any new or worsening signs or symptoms please return to the emergency room for evaluation. Please follow-up with your primary care provider or specialist as discussed. Please use medication prescribed only as directed and discontinue taking if you have any concerning signs or symptoms.   °
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
## 2017-12-02 ENCOUNTER — Encounter (HOSPITAL_COMMUNITY): Payer: Self-pay

## 2017-12-02 ENCOUNTER — Ambulatory Visit (HOSPITAL_COMMUNITY)
Admission: EM | Admit: 2017-12-02 | Discharge: 2017-12-02 | Disposition: A | Discharge status: Home / Self Care | Payer: Self-pay | Attending: Internal Medicine | Admitting: Internal Medicine

## 2017-12-02 DIAGNOSIS — N50811 Right testicular pain: Secondary | ICD-10-CM

## 2017-12-02 DIAGNOSIS — F17210 Nicotine dependence, cigarettes, uncomplicated: Secondary | ICD-10-CM | POA: Insufficient documentation

## 2017-12-02 DIAGNOSIS — I10 Essential (primary) hypertension: Secondary | ICD-10-CM | POA: Insufficient documentation

## 2017-12-02 DIAGNOSIS — Z8249 Family history of ischemic heart disease and other diseases of the circulatory system: Secondary | ICD-10-CM | POA: Insufficient documentation

## 2017-12-02 DIAGNOSIS — R030 Elevated blood-pressure reading, without diagnosis of hypertension: Secondary | ICD-10-CM

## 2017-12-02 DIAGNOSIS — R361 Hematospermia: Secondary | ICD-10-CM

## 2017-12-02 DIAGNOSIS — R319 Hematuria, unspecified: Secondary | ICD-10-CM

## 2017-12-02 DIAGNOSIS — Z79899 Other long term (current) drug therapy: Secondary | ICD-10-CM | POA: Insufficient documentation

## 2017-12-02 DIAGNOSIS — Z7982 Long term (current) use of aspirin: Secondary | ICD-10-CM | POA: Insufficient documentation

## 2017-12-02 LAB — POCT URINALYSIS DIP (DEVICE)
Bilirubin Urine: NEGATIVE
Glucose, UA: NEGATIVE mg/dL
KETONES UR: NEGATIVE mg/dL
Leukocytes, UA: NEGATIVE
Nitrite: NEGATIVE
PH: 5.5 (ref 5.0–8.0)
PROTEIN: 30 mg/dL — AB
Urobilinogen, UA: 0.2 mg/dL (ref 0.0–1.0)

## 2017-12-02 MED ORDER — AZITHROMYCIN 250 MG PO TABS
1000.0000 mg | ORAL_TABLET | Freq: Once | ORAL | Status: AC
  Administered 2017-12-02: 1000 mg via ORAL

## 2017-12-02 MED ORDER — AZITHROMYCIN 250 MG PO TABS
ORAL_TABLET | ORAL | Status: AC
  Filled 2017-12-02: qty 4

## 2017-12-02 MED ORDER — STERILE WATER FOR INJECTION IJ SOLN
INTRAMUSCULAR | Status: AC
  Filled 2017-12-02: qty 10

## 2017-12-02 MED ORDER — CEFTRIAXONE SODIUM 250 MG IJ SOLR
INTRAMUSCULAR | Status: AC
  Filled 2017-12-02: qty 250

## 2017-12-02 MED ORDER — CEFTRIAXONE SODIUM 250 MG IJ SOLR
250.0000 mg | Freq: Once | INTRAMUSCULAR | Status: AC
  Administered 2017-12-02: 250 mg via INTRAMUSCULAR

## 2017-12-02 NOTE — Discharge Instructions (Addendum)
Given rocephin 250mg  injection and azithromycin 1g in office Urine culture and cytology obtained We will follow up with you regarding the results of your tests If tests are positive, please abstain from sexual activity for at least 7 days and notify partners PCP assistance initiated Return, follow up with PCP, or with urology if symptoms persists Return here or go to ER if you have any new or worsening symptoms

## 2017-12-02 NOTE — ED Provider Notes (Addendum)
[facility]ARE [facility]ENTER   [id number] 12/02/17 Arrival Time: 1356   SUBJECTIVE:  Dale Mcintosh is a 32 y.o. male who presents with blood in semen that began this morning.  Reports having sexual intercourse and noticed bright red blood in the condom following ejaculation. Last unprotected sex 3 weeks ago.  Sexually active with 1 male partner.  Partner without symptoms.  Denies alleviating or aggravating factors.  Patient complains of associated right testicular pain.  Denies fever, chills, nausea, vomiting, abdominal pain, urinary symptoms, penile discharge, testicular swelling, penile rashes or lesions.   No LMP for male patient.  ROS: As per HPI.  Past Medical History:  Diagnosis Date  . Hypertension    History reviewed. No pertinent surgical history. Allergies  Allergen Reactions  . Bee Venom Anaphylaxis  . Shrimp [Shellfish Allergy] Itching   No current facility-administered medications on file prior to encounter.    Current Outpatient Medications on File Prior to Encounter  Medication Sig Dispense Refill  . amLODipine (NORVASC) 10 MG tablet Take 1 tablet (10 mg total) by mouth daily. 30 tablet 5  . aspirin-acetaminophen-caffeine (EXCEDRIN MIGRAINE) 250-250-65 MG tablet Take 2 tablets by mouth every 6 (six) hours as needed for headache.    . irbesartan-hydrochlorothiazide (AVALIDE) 150-12.5 MG tablet Take 1 tablet by mouth daily. (Patient not taking: Reported on 08/25/2017) 30 tablet 5  . methocarbamol (ROBAXIN) 500 MG tablet Take 1 tablet (500 mg total) by mouth 2 (two) times daily. 20 tablet 0   Social History   Socioeconomic History  . Marital status: Single    Spouse name: Not on file  . Number of children: Not on file  . Years of education: Not on file  . Highest education level: Not on file  Occupational History  . Not on file  Social Needs  . Financial resource strain: Not on file  . Food insecurity:    Worry: Not on file    Inability: Not on file  .  Transportation needs:    Medical: Not on file    Non-medical: Not on file  Tobacco Use  . Smoking status: Current Every Day Smoker    Packs/day: 0.50    Types: Cigarettes  . Smokeless tobacco: Current User  Substance and Sexual Activity  . Alcohol use: Yes    Comment: occ  . Drug use: No  . Sexual activity: Not on file  Lifestyle  . Physical activity:    Days per week: Not on file    Minutes per session: Not on file  . Stress: Not on file  Relationships  . Social connections:    Talks on phone: Not on file    Gets together: Not on file    Attends religious service: Not on file    Active member of club or organization: Not on file    Attends meetings of clubs or organizations: Not on file    Relationship status: Not on file  . Intimate partner violence:    Fear of current or ex partner: Not on file    Emotionally abused: Not on file    Physically abused: Not on file    Forced sexual activity: Not on file  Other Topics Concern  . Not on file  Social History Narrative  . Not on file   Family History  Problem Relation Age of Onset  . Hypertension Father     OBJECTIVE:  Vitals:   12/02/17 1436 12/02/17 1609  BP: (!) 168/111 (!) 190/125  Pulse: 96 87  Resp: 20 16  Temp: 98.6 F (37 C)   TempSrc: Temporal   SpO2: 99% 96%     General appearance: alert, cooperative, appears stated age and no distress Throat: lips, mucosa, and tongue normal; teeth and gums normal Lungs: CTA bilaterally without adventitious breath sounds Heart: regular rate and rhythm.  Radial pulses 2+ symmetrical bilaterally Back: no CVA tenderness GU: Declines chaperone.  Uncircumcised male; no inguinal LAD; no obvious rashes or lesions; no penile discharge; no testicular masses or nodules appreciated, testicles nontender to palpation, negative phren's sign; no obvious swelling; testicles appear symmetrical in size Rectal: Declines Skin: warm and dry Psychological:  Alert and cooperative. Normal  mood and affect.  Results for orders placed or performed during the hospital encounter of 12/02/17 (from the past 24 hour(s))  POCT urinalysis dip (device)     Status: Abnormal   Collection Time: 12/02/17  3:04 PM  Result Value Ref Range   Glucose, UA NEGATIVE NEGATIVE mg/dL   Bilirubin Urine NEGATIVE NEGATIVE   Ketones, ur NEGATIVE NEGATIVE mg/dL   Specific Gravity, Urine >=1.030 1.005 - 1.030   Hgb urine dipstick LARGE (A) NEGATIVE   pH 5.5 5.0 - 8.0   Protein, ur 30 (A) NEGATIVE mg/dL   Urobilinogen, UA 0.2 0.0 - 1.0 mg/dL   Nitrite NEGATIVE NEGATIVE   Leukocytes, UA NEGATIVE NEGATIVE    ASSESSMENT & PLAN:  1. Testicular pain, right   2. Hematospermia   3. Hematuria, unspecified type   4. Elevated blood pressure reading     Meds ordered this encounter  Medications  . azithromycin (ZITHROMAX) tablet 1,000 mg  . cefTRIAXone (ROCEPHIN) injection 250 mg    Pending: Labs Reviewed  POCT URINALYSIS DIP (DEVICE) - Abnormal; Notable for the following components:      Result Value   Hgb urine dipstick LARGE (*)    Protein, ur 30 (*)    All other components within normal limits  URINE CULTURE  URINE CYTOLOGY ANCILLARY ONLY    Given rocephin 250mg  injection and azithromycin 1g in office Urine culture and cytology obtained We will follow up with you regarding the results of your tests If tests are positive, please abstain from sexual activity for at least 7 days and notify partners PCP assistance initiated Return, follow up with PCP, or with urology if symptoms persists Return here or go to ER if you have any new or worsening symptoms    Patient forgot to take BP medication this morning.  Elevated blood pressure in office.  Will take medication tomorrow and recheck.  He will follow up with PCP if his blood pressure continues to be elevated.    Reviewed expectations re: course of current medical issues. Questions answered. Outlined signs and symptoms indicating need for  more acute intervention. Patient verbalized understanding. After Visit Summary given.     Rennis Harding, PA-C 12/02/17 [phone]

## 2017-12-02 NOTE — ED Triage Notes (Signed)
Pt presents with blood in semen

## 2017-12-03 ENCOUNTER — Emergency Department (HOSPITAL_COMMUNITY)
Admission: EM | Admit: 2017-12-03 | Discharge: 2017-12-03 | Disposition: A | Discharge status: Home / Self Care | Payer: Self-pay | Attending: Emergency Medicine | Admitting: Emergency Medicine

## 2017-12-03 ENCOUNTER — Encounter (HOSPITAL_COMMUNITY): Payer: Self-pay | Admitting: Emergency Medicine

## 2017-12-03 DIAGNOSIS — I10 Essential (primary) hypertension: Secondary | ICD-10-CM

## 2017-12-03 DIAGNOSIS — R361 Hematospermia: Secondary | ICD-10-CM

## 2017-12-03 DIAGNOSIS — F17210 Nicotine dependence, cigarettes, uncomplicated: Secondary | ICD-10-CM | POA: Insufficient documentation

## 2017-12-03 DIAGNOSIS — Z79899 Other long term (current) drug therapy: Secondary | ICD-10-CM | POA: Insufficient documentation

## 2017-12-03 LAB — CBC
HEMATOCRIT: 41.5 % (ref 39.0–52.0)
Hemoglobin: 12.7 g/dL — ABNORMAL LOW (ref 13.0–17.0)
MCH: 27.3 pg (ref 26.0–34.0)
MCHC: 30.6 g/dL (ref 30.0–36.0)
MCV: 89.2 fL (ref 78.0–100.0)
Platelets: 182 10*3/uL (ref 150–400)
RBC: 4.65 MIL/uL (ref 4.22–5.81)
RDW: 15.4 % (ref 11.5–15.5)
WBC: 10.7 10*3/uL — AB (ref 4.0–10.5)

## 2017-12-03 LAB — COMPREHENSIVE METABOLIC PANEL
ALT: 29 U/L (ref 0–44)
ANION GAP: 8 (ref 5–15)
AST: 28 U/L (ref 15–41)
Albumin: 4 g/dL (ref 3.5–5.0)
Alkaline Phosphatase: 56 U/L (ref 38–126)
BILIRUBIN TOTAL: 0.4 mg/dL (ref 0.3–1.2)
BUN: 18 mg/dL (ref 6–20)
CHLORIDE: 106 mmol/L (ref 98–111)
CO2: 28 mmol/L (ref 22–32)
Calcium: 9.2 mg/dL (ref 8.9–10.3)
Creatinine, Ser: 1.19 mg/dL (ref 0.61–1.24)
Glucose, Bld: 97 mg/dL (ref 70–99)
POTASSIUM: 3.9 mmol/L (ref 3.5–5.1)
Sodium: 142 mmol/L (ref 135–145)
TOTAL PROTEIN: 7.5 g/dL (ref 6.5–8.1)

## 2017-12-03 LAB — URINALYSIS, ROUTINE W REFLEX MICROSCOPIC
BILIRUBIN URINE: NEGATIVE
GLUCOSE, UA: NEGATIVE mg/dL
HGB URINE DIPSTICK: NEGATIVE
Ketones, ur: 5 mg/dL — AB
Leukocytes, UA: NEGATIVE
Nitrite: NEGATIVE
PH: 5 (ref 5.0–8.0)
PROTEIN: NEGATIVE mg/dL
Specific Gravity, Urine: 1.029 (ref 1.005–1.030)

## 2017-12-03 LAB — URINE CULTURE: CULTURE: NO GROWTH

## 2017-12-03 LAB — URINE CYTOLOGY ANCILLARY ONLY
CHLAMYDIA, DNA PROBE: NEGATIVE
Neisseria Gonorrhea: NEGATIVE
TRICH (WINDOWPATH): NEGATIVE

## 2017-12-03 MED ORDER — AMLODIPINE BESYLATE 5 MG PO TABS
10.0000 mg | ORAL_TABLET | Freq: Once | ORAL | Status: AC
  Administered 2017-12-03: 10 mg via ORAL
  Filled 2017-12-03: qty 2

## 2017-12-03 MED ORDER — HYDROCHLOROTHIAZIDE 25 MG PO TABS: 25.0000 mg | ORAL_TABLET | Freq: Every day | ORAL | 2 refills | Status: DC

## 2017-12-03 NOTE — Discharge Instructions (Addendum)
Thank you for allowing me to care for you today in the Emergency Department.   Your blood pressure was very high today.  It is very important that you get your blood pressure under control.  Diet and exercise can play an important part of healthy you maintain good control of your blood pressure.  I have attached some information on dietary changes that may help improve your blood pressure.  Take 1 tablet of HCTZ daily.  This is a blood pressure medication.  It should be on the $4 list at [facility].  You can download the app good Rx to determine what the cost would be to get the medication filled at the pharmacy.  Your test for gonorrhea and chlamydia are pending.  If positive, someone from the hospital will call you.    Your bleeding symptoms do not improve within the next week, please call [facility] and schedule follow-up appointment.  Return to the emergency department if you develop a headache, chest pain, shortness of breath, changes to your vision, significant redness or swelling to the penis or testicles, or other new concerning symptoms while your blood pressure is high.

## 2017-12-03 NOTE — ED Triage Notes (Signed)
Patient complains of hematuria yesterday, then last night during sexual intercourse states he ejaculated blood. Patient denies pain, denies swelling. Patient alert, oriented, and in no apparent distress at this time.

## 2017-12-03 NOTE — ED Provider Notes (Signed)
[facility] EMERGENCY DEPARTMENT Provider Note   CSN: [id number] Arrival date & time: 12/03/17  4098     History   Chief Complaint Chief Complaint  Patient presents with  . Hematuria    HPI Le Faulcon is a 32 y.o. male with a history of HTN who presents to the emergency department with a chief complaint of hematospermia, onset yesterday.  He reports bright red blood immediately following ejaculation while having intercourse yesterday.  He reports that he noticed some blood in his urine that seemed to improve each time he voided.  He reports that he was seen at urgent care where he was treated with antibiotics for concern with STD.  They took a urine sample and sent it for culture, but he was told that it would not be back for several days.  He reports that he ejaculated earlier today and had another episode of blood immediately following ejaculation with hematuria that continued to improve with each void.  He last voided in the ED with no gross blood in his urine.  He endorses mild right-sided testicular pain that was intermittent yesterday and worse with walking that resolved spontaneously.  He denies fever, chills, penile pain or discharge, rectal pain, melena, hematochezia, hematemesis, nausea, vomiting, or rash.  He reports that he was previously taking antihypertensives, but does not have a primary care doctor and has not been taking the medication for some time.  He denies chest pain, headache, visual change, difficulty peeing, or abdominal pain.  The history is provided by the patient. No language interpreter was used.    Past Medical History:  Diagnosis Date  . Hypertension     Patient Active Problem List   Diagnosis Date Noted  . Excessive daytime sleepiness 02/01/2017  . Morning headache 02/01/2017  . Morbid obesity (HCC) 02/01/2017  . Snoring 02/01/2017  . 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 No pertinent surgical  history.      Home Medications    Prior to Admission medications   Medication Sig Start Date End Date Taking? Authorizing Provider  amLODipine (NORVASC) 10 MG tablet Take 1 tablet (10 mg total) by mouth daily. 08/25/17   Hedges, Tinnie Gens, PA-C  aspirin-acetaminophen-caffeine (EXCEDRIN MIGRAINE) [phone] MG tablet Take 2 tablets by mouth every 6 (six) hours as needed for headache.    [provider]  hydrochlorothiazide (HYDRODIURIL) 25 MG tablet Take 1 tablet (25 mg total) by mouth daily. 12/03/17 03/03/18  Ladeidra Borys A, PA-C  irbesartan-hydrochlorothiazide (AVALIDE) 150-12.5 MG tablet Take 1 tablet by mouth daily. Patient not taking: Reported on 08/25/2017 01/30/17   Chrystie Nose, MD  methocarbamol (ROBAXIN) 500 MG tablet Take 1 tablet (500 mg total) by mouth 2 (two) times daily. 08/25/17   Eyvonne Mechanic, PA-C    Family History Family History  Problem Relation Age of Onset  . Hypertension Father     Social History Social History   Tobacco Use  . Smoking status: Current Every Day Smoker    Packs/day: 0.50    Types: Cigarettes  . Smokeless tobacco: Current User  Substance Use Topics  . Alcohol use: Yes    Comment: occ  . Drug use: No     Allergies   Bee venom and Shrimp [shellfish allergy]   Review of Systems Review of Systems  Constitutional: Negative for appetite change, chills and fever.  Respiratory: Negative for shortness of breath.   Cardiovascular: Negative for chest pain.  Gastrointestinal: Negative for abdominal pain and vomiting.  Genitourinary: Positive for hematuria and testicular pain. Negative for dysuria, flank pain, frequency, penile swelling, scrotal swelling and urgency.       Hematospermia  Musculoskeletal: Negative for back pain.  Skin: Negative for rash.  Allergic/Immunologic: Negative for immunocompromised state.  Neurological: Negative for weakness, numbness and headaches.  Psychiatric/Behavioral: Negative for confusion.      Physical Exam Updated Vital Signs BP (!) 183/122 (BP Location: Right Arm)   Pulse 89   Temp 98.2 F (36.8 C) (Oral)   Resp 18   SpO2 97%   Physical Exam  Constitutional: He appears well-developed.  Obese male.  No acute distress.  HENT:  Head: Normocephalic.  Eyes: Conjunctivae are normal.  Neck: Neck supple.  Cardiovascular: Normal rate, regular rhythm, normal heart sounds and intact distal pulses. Exam reveals no gallop and no friction rub.  No murmur heard. Pulmonary/Chest: Effort normal. No stridor. No respiratory distress. He has no wheezes. He has no rales. He exhibits no tenderness.  Abdominal: Soft. He exhibits no distension and no mass. There is no tenderness. There is no rebound and no guarding. No hernia.  Genitourinary:  Genitourinary Comments: Chaperoned exam.  No inguinal lymphadenopathy bilaterally.  No tenderness to the scrotal region bilaterally.  No edema to the penis or scrotum.  Patient declines rectal exam.  No perineal pain.  Neurological: He is alert.  Skin: Skin is warm and dry.  Psychiatric: His behavior is normal.  Nursing note and vitals reviewed.  ED Treatments / Results  Labs (all labs ordered are listed, but only abnormal results are displayed) Labs Reviewed  URINALYSIS, ROUTINE W REFLEX MICROSCOPIC - Abnormal; Notable for the following components:      Result Value   Ketones, ur 5 (*)    All other components within normal limits  CBC - Abnormal; Notable for the following components:   WBC 10.7 (*)    Hemoglobin 12.7 (*)    All other components within normal limits  URINE CULTURE  COMPREHENSIVE METABOLIC PANEL  GC/CHLAMYDIA PROBE AMP ([place]) NOT AT [facility]    EKG None  Radiology No results found.  Procedures Procedures (including critical care time)  Medications Ordered in ED Medications  amLODipine (NORVASC) tablet 10 mg (10 mg Oral Given 12/03/17 1238)     Initial Impression / Assessment and Plan / ED Course  I have  reviewed the triage vital signs and the nursing notes.  Pertinent labs & imaging results that were available during my care of the patient were reviewed by me and considered in my medical decision making (see chart for details).     32 year old male with a history of hypertension presenting with hematospermia and secondary hematuria, onset yesterday.  UA with culture performed yesterday at urgent care.  It does not appear that a GC chlamydia was ordered.  Repeat urine today with improved proteinuria and hemoglobinuria.  He has already been treated for gonorrhea and chlamydia.  Doubt prostatitis, epididymitis, or malignancy given the patient's age and risk factors.  Labs are otherwise reassuring.  No signs of endorgan disease given the patient's hypertension.   We had a lengthy discussion regarding the patient's long-standing history of hypertension.  He is requesting an antihypertensive at this time and I have given him the risk versus benefits of a temporary solution.  He states that he is committed to finding a primary care provider for follow-up.  We will put the patient on a low-dose of HCTZ.  He has also been given a urology follow-up  if hematospermia does not improve within the next week.  Strict return precautions given.  He is hemodynamically stable and in no acute distress.  He is safe for discharge home at this time with outpatient follow-up.  Final Clinical Impressions(s) / ED Diagnoses   Final diagnoses:  Hypertension, unspecified type  Hematospermia    ED Discharge Orders        Ordered    hydrochlorothiazide (HYDRODIURIL) 25 MG tablet  Daily     12/03/17 1445       Sharmila Wrobleski A, PA-C 12/03/17 1734    Loren Racer, MD 12/06/17 1550

## 2017-12-03 NOTE — ED Notes (Signed)
Removed IV, per Mia - PA.

## 2017-12-03 NOTE — ED Notes (Signed)
Patient verbalizes understanding of discharge instructions. Opportunity for questioning and answers were provided. Armband removed by staff, pt discharged from ED.  

## 2017-12-04 LAB — URINE CULTURE: CULTURE: NO GROWTH

## 2018-03-09 ENCOUNTER — Other Ambulatory Visit: Payer: Self-pay

## 2018-03-09 ENCOUNTER — Emergency Department (HOSPITAL_COMMUNITY)
Admission: EM | Admit: 2018-03-09 | Discharge: 2018-03-09 | Disposition: A | Discharge status: Home / Self Care | Payer: Self-pay | Attending: Emergency Medicine | Admitting: Emergency Medicine

## 2018-03-09 ENCOUNTER — Emergency Department (HOSPITAL_COMMUNITY): Payer: Self-pay

## 2018-03-09 DIAGNOSIS — F17210 Nicotine dependence, cigarettes, uncomplicated: Secondary | ICD-10-CM | POA: Insufficient documentation

## 2018-03-09 DIAGNOSIS — I10 Essential (primary) hypertension: Secondary | ICD-10-CM | POA: Insufficient documentation

## 2018-03-09 DIAGNOSIS — Z79899 Other long term (current) drug therapy: Secondary | ICD-10-CM | POA: Insufficient documentation

## 2018-03-09 DIAGNOSIS — R0789 Other chest pain: Secondary | ICD-10-CM | POA: Insufficient documentation

## 2018-03-09 DIAGNOSIS — Z7982 Long term (current) use of aspirin: Secondary | ICD-10-CM | POA: Insufficient documentation

## 2018-03-09 LAB — I-STAT TROPONIN, ED
Troponin i, poc: 0.05 ng/mL (ref 0.00–0.08)
Troponin i, poc: 0.06 ng/mL (ref 0.00–0.08)

## 2018-03-09 LAB — CBC
HCT: 46.3 % (ref 39.0–52.0)
HEMOGLOBIN: 14.2 g/dL (ref 13.0–17.0)
MCH: 26.7 pg (ref 26.0–34.0)
MCHC: 30.7 g/dL (ref 30.0–36.0)
MCV: 87 fL (ref 80.0–100.0)
Platelets: 221 10*3/uL (ref 150–400)
RBC: 5.32 MIL/uL (ref 4.22–5.81)
RDW: 14.9 % (ref 11.5–15.5)
WBC: 9.6 10*3/uL (ref 4.0–10.5)
nRBC: 0 % (ref 0.0–0.2)

## 2018-03-09 LAB — BASIC METABOLIC PANEL
ANION GAP: 3 — AB (ref 5–15)
BUN: 17 mg/dL (ref 6–20)
CALCIUM: 9.4 mg/dL (ref 8.9–10.3)
CO2: 31 mmol/L (ref 22–32)
Chloride: 107 mmol/L (ref 98–111)
Creatinine, Ser: 1.33 mg/dL — ABNORMAL HIGH (ref 0.61–1.24)
GFR calc Af Amer: >60 mL/min (ref >60)
GLUCOSE: 125 mg/dL — AB (ref 70–99)
POTASSIUM: 3.6 mmol/L (ref 3.5–5.1)
Sodium: 141 mmol/L (ref 135–145)

## 2018-03-09 MED ORDER — AMLODIPINE BESYLATE 5 MG PO TABS
10.0000 mg | ORAL_TABLET | Freq: Once | ORAL | Status: AC
  Administered 2018-03-09: 10 mg via ORAL
  Filled 2018-03-09: qty 2

## 2018-03-09 MED ORDER — HYDROCHLOROTHIAZIDE 25 MG PO TABS
25.0000 mg | ORAL_TABLET | Freq: Every day | ORAL | Status: DC
  Administered 2018-03-09: 25 mg via ORAL
  Filled 2018-03-09: qty 1

## 2018-03-09 MED ORDER — AMLODIPINE BESYLATE 10 MG PO TABS: 10.0000 mg | ORAL_TABLET | Freq: Every day | ORAL | 0 refills | Status: DC

## 2018-03-09 MED ORDER — HYDROCHLOROTHIAZIDE 25 MG PO TABS: 25.0000 mg | ORAL_TABLET | Freq: Every day | ORAL | 0 refills | Status: DC

## 2018-03-09 MED ORDER — CLONIDINE HCL 0.1 MG PO TABS
0.1000 mg | ORAL_TABLET | Freq: Once | ORAL | Status: AC
  Administered 2018-03-09: 0.1 mg via ORAL
  Filled 2018-03-09: qty 1

## 2018-03-09 NOTE — ED Triage Notes (Signed)
Pt in c/o chest pain that started last night, pain is intermittent, denies nausea, reports cough for the last two weeks, no distress noted

## 2018-03-09 NOTE — ED Provider Notes (Signed)
[facility] EMERGENCY DEPARTMENT Provider Note   CSN: [id number] Arrival date & time: 03/09/18  1040     History   Chief Complaint Chief Complaint  Patient presents with  . Chest Pain  . Cough    HPI Dale Mcintosh is a 32 y.o. male.  Dale Mcintosh is a 32 y.o. Male with a history of hypertension and morbid obesity, who presents to the emergency department for evaluation of chest pain that started last night.  He reports chest pains have been intermittent since last night.  Pain is not worse with exertion, does not radiate.  He denies associated diaphoresis, nausea, vomiting or abdominal pain.  Does report intermittent cough over the last 2 weeks, that is dry and nonproductive.  He denies any associated shortness of breath currently, but does report over the last few months he has noted some exertional dyspnea.  He is currently chest pain-free.  No lightheadedness or syncope.  Reports history of intermittent headaches, no worse than usual recently.  No acute vision changes, facial asymmetry, difficulty with speech, numbness weakness or tingling in any of his extremities.  Patient noted to be hypertensive here in the emergency department and reports known history of hypertension but he has not followed up outpatient and has been without his blood pressure medications for at least 1 month.  Reports he thinks he was on HCTZ previously but there may have been a another agent as well.     Past Medical History:  Diagnosis Date  . Hypertension     Patient Active Problem List   Diagnosis Date Noted  . Excessive daytime sleepiness 02/01/2017  . Morning headache 02/01/2017  . Morbid obesity (HCC) 02/01/2017  . Snoring 02/01/2017  . 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      Home Medications    Prior to Admission medications   Medication Sig Start Date End Date Taking? Authorizing Provider  amLODipine (NORVASC) 10 MG tablet Take 1  tablet (10 mg total) by mouth daily. 03/09/18   Dartha Lodge, PA-C  aspirin-acetaminophen-caffeine (EXCEDRIN MIGRAINE) [phone] MG tablet Take 2 tablets by mouth every 6 (six) hours as needed for headache.    [provider]  hydrochlorothiazide (HYDRODIURIL) 25 MG tablet Take 1 tablet (25 mg total) by mouth daily. 03/09/18   Dartha Lodge, PA-C  irbesartan-hydrochlorothiazide (AVALIDE) 150-12.5 MG tablet Take 1 tablet by mouth daily. Patient not taking: Reported on 08/25/2017 01/30/17   Chrystie Nose, MD  methocarbamol (ROBAXIN) 500 MG tablet Take 1 tablet (500 mg total) by mouth 2 (two) times daily. 08/25/17   Eyvonne Mechanic, PA-C    Family History Family History  Problem Relation Age of Onset  . Hypertension Father     Social History Social History   Tobacco Use  . Smoking status: Current Every Day Smoker    Packs/day: 0.50    Types: Cigarettes  . Smokeless tobacco: Current User  Substance Use Topics  . Alcohol use: Yes    Comment: occ  . Drug use: No     Allergies   Bee venom and Shrimp [shellfish allergy]   Review of Systems Review of Systems  Constitutional: Negative for chills and fever.  HENT: Negative.   Eyes: Negative for visual disturbance.  Respiratory: Negative for cough and shortness of breath.   Cardiovascular: Positive for chest pain. Negative for palpitations and leg swelling.  Gastrointestinal: Negative for abdominal pain, nausea and vomiting.  Musculoskeletal: Negative for  back pain.  Skin: Negative for color change and rash.  Neurological: Negative for dizziness, facial asymmetry, speech difficulty, weakness, light-headedness, numbness and headaches.  All other systems reviewed and are negative.    Physical Exam Updated Vital Signs BP (!) 193/135 (BP Location: Right Arm)   Pulse 94   Temp 98.5 F (36.9 C) (Oral)   Resp 18   SpO2 100%   Physical Exam  Constitutional: He is oriented to person, place, and time. He appears  well-developed and well-nourished. He does not appear ill. No distress.  HENT:  Head: Normocephalic and atraumatic.  Mouth/Throat: Oropharynx is clear and moist.  Eyes: Pupils are equal, round, and reactive to light. EOM are normal. Right eye exhibits no discharge. Left eye exhibits no discharge.  Neck: Neck supple.  Cardiovascular: Normal rate, regular rhythm, normal heart sounds and intact distal pulses.  Pulses:      Radial pulses are 2+ on the right side, and 2+ on the left side.       Dorsalis pedis pulses are 2+ on the right side, and 2+ on the left side.       Posterior tibial pulses are 2+ on the right side, and 2+ on the left side.  Pulmonary/Chest: Effort normal and breath sounds normal. No respiratory distress. He has no wheezes. He has no rales.  Respirations equal and unlabored, patient able to speak in full sentences, lungs clear to auscultation bilaterally  Abdominal: Soft. Bowel sounds are normal. He exhibits no distension and no mass. There is no tenderness. There is no guarding.  Abdomen soft, nondistended, nontender to palpation in all quadrants without guarding or peritoneal signs  Musculoskeletal: He exhibits no edema or deformity.       Right lower leg: Normal. He exhibits no tenderness and no edema.       Left lower leg: Normal. He exhibits no tenderness and no edema.  Neurological: He is alert and oriented to person, place, and time. Coordination normal.  Speech is clear, able to follow commands CN III-XII intact Normal strength in upper and lower extremities bilaterally including dorsiflexion and plantar flexion, strong and equal grip strength Sensation normal to light and sharp touch Moves extremities without ataxia, coordination intact  Skin: Skin is warm and dry. Capillary refill takes less than 2 seconds. He is not diaphoretic.  Nursing note and vitals reviewed.    ED Treatments / Results  Labs (all labs ordered are listed, but only abnormal results are  displayed) Labs Reviewed  BASIC METABOLIC PANEL - Abnormal; Notable for the following components:      Result Value   Glucose, Bld 125 (*)    Creatinine, Ser 1.33 (*)    Anion gap 3 (*)    All other components within normal limits  CBC  I-STAT TROPONIN, ED  I-STAT TROPONIN, ED    EKG EKG Interpretation  Date/Time:  Monday March 09 2018 11:01:45 EST Ventricular Rate:  102 PR Interval:  152 QRS Duration: 90 QT Interval:  358 QTC Calculation: 466 R Axis:   45 Text Interpretation:  Sinus tachycardia Biatrial enlargement Left ventricular hypertrophy T wave abnormality, consider lateral ischemia Abnormal ECG Confirmed by Kristine Royal [phone]) on 03/09/2018 4:32:52 PM   Radiology Dg Chest 2 View  Result Date: 03/09/2018 CLINICAL DATA:  Chest pain EXAM: CHEST - 2 VIEW COMPARISON:  12/28/2016 FINDINGS: Cardiac enlargement without heart failure. Lungs are clear without infiltrate or effusion. IMPRESSION: No active cardiopulmonary disease. Electronically Signed   By: Leonette Most  Chestine Spore M.D.   On: 03/09/2018 11:38    Procedures Procedures (including critical care time)  Medications Ordered in ED Medications  hydrochlorothiazide (HYDRODIURIL) tablet 25 mg (25 mg Oral Given 03/09/18 1755)  amLODipine (NORVASC) tablet 10 mg (10 mg Oral Given 03/09/18 1755)  cloNIDine (CATAPRES) tablet 0.1 mg (0.1 mg Oral Given 03/09/18 1915)     Initial Impression / Assessment and Plan / ED Course  I have reviewed the triage vital signs and the nursing notes.  Pertinent labs & imaging results that were available during my care of the patient were reviewed by me and considered in my medical decision making (see chart for details).  Patient presents to the emergency department for evaluation of intermittent chest pains that started yesterday.  Patient describes pain as a sharp pain that comes and goes over the left side of the chest.  He is currently chest pain-free.  PERC negative.  Story not consistent  with dissection.  Pain does not radiate, is not worse with exertion or pleuritic in nature.  No associated shortness of breath or lightheadedness.  On exam patient is noted to be hypertensive, has known history of hypertension but has not has his medications in over a month, and does not have some when he regularly follows up with for blood pressure management, he denies any associated lower extremity swelling, no headaches, vision changes, numbness or weakness.  Chest pain work-up initiated in triage.  EKG without concerning ischemic changes and initial troponin negative.  No acute electrolyte derangements, creatinine is very slightly increased at 1.33, but no signs of significant endorgan damage, chest x-ray is clear.  Normal neurologic exam.  I have low suspicion for hypertensive urgency or emergency, will provide dose of amlodipine and HCTZ which patient has been on previously and will also give 0.1 mg of clonidine to help acutely lower blood pressure as it is significantly elevated here in the emergency department.  After medications blood pressure has improved and the patient remains asymptomatic. Will have pt follow up with their PCP in 1 week for blood pressure check. Discussed long term consequences of untreated hypertension with the patient.  Been provided prescriptions for his blood pressure medications as well as good Rx coupons, encouraged to follow-up with community health and wellness.   Vitals:   03/09/18 1840 03/09/18 2003  BP: (!) 227/168 (!) 185/136  Pulse: 89 95  Resp: 18 16  Temp:    SpO2: 100% 100%     Final Clinical Impressions(s) / ED Diagnoses   Final diagnoses:  Atypical chest pain  Hypertension, unspecified type    ED Discharge Orders         Ordered    amLODipine (NORVASC) 10 MG tablet  Daily     03/09/18 2009    hydrochlorothiazide (HYDRODIURIL) 25 MG tablet  Daily     03/09/18 2009           Dartha Lodge, Cordelia Poche 03/09/18 2026    Wynetta Fines,  MD 03/13/18 1506

## 2018-03-09 NOTE — Discharge Instructions (Signed)
Your chest pain work-up is overall been reassuring, your blood pressure is elevated today and it is very important that you take your blood pressure medication regularly, please get these prescriptions filled and begin taking them once daily tomorrow.  You will need to follow-up with the [facility] wellness clinic to help make sure you continue to have prescriptions for your blood pressure medications and that this continues to be managed appropriately, untreated hypertension can lead to heart disease, stroke, kidney damage and other long-term effects.  Return to the emergency department if you have persistent or worsening chest pain, shortness of breath, swelling in your lower legs, if you feel lightheaded or dizzy like you might pass out, you have a severe headache with vision changes numbness or weakness or any other new or concerning symptoms.

## 2018-03-09 NOTE — ED Notes (Signed)
Patient verbalizes understanding of medications and discharge instructions. No further questions at this time. VSS and patient ambulatory at discharge.   

## 2018-03-09 NOTE — ED Notes (Signed)
ED Provider at bedside. 

## 2018-03-09 NOTE — ED Notes (Signed)
Patient states "I'm ready to go home. I've got to catch the bus." Patient informed we would like his BP to decrease before he leaves. Patient agrees with plan at this time. Will continue to monitor.

## 2018-07-31 ENCOUNTER — Other Ambulatory Visit: Payer: Self-pay

## 2018-07-31 ENCOUNTER — Emergency Department (HOSPITAL_COMMUNITY)
Admission: EM | Admit: 2018-07-31 | Discharge: 2018-07-31 | Disposition: A | Discharge status: Home / Self Care | Payer: Self-pay | Attending: Emergency Medicine | Admitting: Emergency Medicine

## 2018-07-31 ENCOUNTER — Encounter (HOSPITAL_COMMUNITY): Payer: Self-pay | Admitting: Emergency Medicine

## 2018-07-31 DIAGNOSIS — Z209 Contact with and (suspected) exposure to unspecified communicable disease: Secondary | ICD-10-CM

## 2018-07-31 DIAGNOSIS — F17210 Nicotine dependence, cigarettes, uncomplicated: Secondary | ICD-10-CM | POA: Insufficient documentation

## 2018-07-31 DIAGNOSIS — Z20828 Contact with and (suspected) exposure to other viral communicable diseases: Secondary | ICD-10-CM | POA: Diagnosis not present

## 2018-07-31 DIAGNOSIS — Z79899 Other long term (current) drug therapy: Secondary | ICD-10-CM | POA: Insufficient documentation

## 2018-07-31 DIAGNOSIS — I10 Essential (primary) hypertension: Secondary | ICD-10-CM | POA: Diagnosis not present

## 2018-07-31 LAB — BASIC METABOLIC PANEL
Anion gap: 10 (ref 5–15)
BUN: 16 mg/dL (ref 6–20)
CHLORIDE: 105 mmol/L (ref 98–111)
CO2: 27 mmol/L (ref 22–32)
Calcium: 9.1 mg/dL (ref 8.9–10.3)
Creatinine, Ser: 1.19 mg/dL (ref 0.61–1.24)
GFR calc Af Amer: >60 mL/min (ref >60)
GLUCOSE: 97 mg/dL (ref 70–99)
POTASSIUM: 3.7 mmol/L (ref 3.5–5.1)
Sodium: 142 mmol/L (ref 135–145)

## 2018-07-31 MED ORDER — AMLODIPINE BESYLATE 10 MG PO TABS: 10.0000 mg | ORAL_TABLET | Freq: Every evening | ORAL | 0 refills | Status: DC

## 2018-07-31 MED ORDER — HYDROCHLOROTHIAZIDE 25 MG PO TABS
25.0000 mg | ORAL_TABLET | Freq: Once | ORAL | Status: AC
  Administered 2018-07-31: 25 mg via ORAL
  Filled 2018-07-31: qty 1

## 2018-07-31 MED ORDER — ACETAMINOPHEN 500 MG PO TABS
1000.0000 mg | ORAL_TABLET | Freq: Once | ORAL | Status: AC
  Administered 2018-07-31: 1000 mg via ORAL
  Filled 2018-07-31: qty 2

## 2018-07-31 MED ORDER — AMLODIPINE BESYLATE 5 MG PO TABS
10.0000 mg | ORAL_TABLET | Freq: Once | ORAL | Status: AC
  Administered 2018-07-31: 10 mg via ORAL
  Filled 2018-07-31: qty 2

## 2018-07-31 MED ORDER — HYDROCHLOROTHIAZIDE 25 MG PO TABS: 25.0000 mg | ORAL_TABLET | Freq: Every day | ORAL | 0 refills | Status: DC

## 2018-07-31 NOTE — Discharge Instructions (Addendum)
Please see the information and instructions below regarding your visit.  Your diagnoses today include:  1. Hypertension, unspecified type   2. Infectious disease exposure     You are diagnosed today with a viral respiratory illness. One of the major respiratory illnesses of concern circulating in our community is COVID-19. The Novel Coronavirus 2019, was first reported on in [place], [place] in late December 2019.  The outbreak was declared a public health emergency of international concern in January 2020 and on March 11th, 2020, the outbreak was declared a global pandemic.  The spread of this virus is now global with lots of media attention.  The virus has been named SARS-CoV-2 and the disease it causes has become known as coronavirus disease 2019 (COVID-19). Although symptoms can be variable, the predominant symptoms are fever, cough, and shortness of breath.   The incubation period (period during which the patient has the disease and can spread it to others but does not have symptoms of disease) is from 5-14 days.   [place] is considered to have "community spread" of COVID-19. This means that we are seeing cases of COVID-19 that we cannot connect to other known positive cases. Due to lack of testing supplies, we do not have the ability to test all patients, so we implement stringent home quarantine protocols to reduce the spread of disease. Please refer to the information below on Home Care and from the [place] Department of [employer] and [employer] Services to guide you on keeping your family safe and returning to normal activity.  Tests performed today include: See side panel of your discharge paperwork for testing performed today. Vital signs are listed at the bottom of these instructions.   Your electrolytes are normal today.  Medications prescribed:    Take any prescribed medications only as prescribed, and any over the counter medications only as directed on the packaging.  There is ongoing  research on the best medication to take for fever with COVID-19. Some preliminary information suggested a hypothetical adverse response to non-steroidal anti-inflammatory medications (ibuprofen/Motrin/Advil, or naproxen/Aleve). This is not proven in literature just yet. It is reasonable to initially try to control fever with Acetaminophen/Tylenol, 650 mg every 4 hours or 1000 mg every 6 hours. DO NOT EXCEED 4000 MG OF ACETAMINOPHEN/TYLENOL IN ONE DAY AS IT CAN HARM THE LIVER.  Please resume taking your home medications.  I recommend hydrochlorothiazide in the morning and amlodipine at night.   Home care instructions:  Please follow any educational materials contained in this packet.   Since we do not have the ability to widely test patients for COVID-19, the Centers for Disease Control and Prevention (CDC) recommend the following guidelines currently for self-quarantine during a respiratory illness:   Since you have had a known positive exposure to COVID-19, please remain in quarantine for 14 days.  Please refer to the attached information on how to remain in quarantine.  This means not going to work, supermarkets, or any other public setting.  You should have a section of your house that this does need to just for you.  I recommend not having her children visit for this 14-day quarantine.  Follow-up instructions:   Return instructions:  Please return to the Emergency Department if you experience worsening symptoms.  RETURN IMMEDIATELY IF you develop shortness of breath, chest pain, confusion or altered mental status, a new rash, become dizzy, faint, or poorly responsive, or are unable to be cared for at home. Some patients develop bacterial pneumonia as  a result of viral respiratory infections. Signs and symptoms include return of fever, increased production of green or yellow phlegm, chest pain, shortness of breath. You need to be reevaluated if you experience these symptoms. Please return if  you have persistent vomiting and cannot keep down fluids or develop a fever that is not controlled by Acetaminophen (Tylenol) or Ibuprofen (Motrin). Please return if you have any other emergent concerns.   Additional Information:   Your vital signs today were: BP (!) 192/150 (BP Location: Right Arm)    Pulse (!) 105    Temp 98 F (36.7 C) (Oral)    Resp 20    Ht 5\' 6"  (1.676 m)    Wt 136.1 kg    SpO2 100%    BMI 48.42 kg/m  If your blood pressure (BP) was elevated on multiple readings during this visit above 130 for the top number or above 80 for the bottom number, please have this repeated by your primary care provider within one month. --------------  Thank you for allowing [place] to participate in your care today.        Individuals who are confirmed to have, or are being evaluated for, COVID-19 should follow the prevention steps below until a healthcare provider or local or state health department says they can return to normal activities.  Stay home except to get medical care You should restrict activities outside your home, except for getting medical care. Do not go to work, school, or public areas, and do not use public transportation or taxis.  Call ahead before visiting your doctor Before your medical appointment, call the healthcare provider and tell them that you have, or are being evaluated for, COVID-19 infection. This will help the healthcare providers office take steps to keep other people from getting infected. Ask your healthcare provider to call the local or state health department.  Monitor your symptoms Seek prompt medical attention if your illness is worsening (e.g., difficulty breathing). Before going to your medical appointment, call the healthcare provider and tell them that you have, or are being evaluated for, COVID-19 infection. Ask your healthcare provider to call the local or state health department.  Wear a facemask You should wear a facemask that  covers your nose and mouth when you are in the same room with other people and when you visit a healthcare provider. People who live with or visit you should also wear a facemask while they are in the same room with you.  Separate yourself from other people in your home As much as possible, you should stay in a different room from other people in your home. Also, you should use a separate bathroom, if available.  Avoid sharing household items You should not share dishes, drinking glasses, cups, eating utensils, towels, bedding, or other items with other people in your home. After using these items, you should wash them thoroughly with soap and water.  Cover your coughs and sneezes Cover your mouth and nose with a tissue when you cough or sneeze, or you can cough or sneeze into your sleeve. Throw used tissues in a lined trash can, and immediately wash your hands with soap and water for at least 20 seconds or use an alcohol-based hand rub.  Wash your [employer] your hands often and thoroughly with soap and water for at least 20 seconds. You can use an alcohol-based hand sanitizer if soap and water are not available and if your hands are not visibly dirty. Avoid touching your  eyes, nose, and mouth with unwashed hands.   Prevention Steps for Caregivers and Household Members of Individuals Confirmed to have, or Being Evaluated for, COVID-19 Infection Being Cared for in the Home  If you live with, or provide care at home for, a person confirmed to have, or being evaluated for, COVID-19 infection please follow these guidelines to prevent infection:  Follow healthcare providers instructions Make sure that you understand and can help the patient follow any healthcare provider instructions for all care.  Provide for the patients basic needs You should help the patient with basic needs in the home and provide support for getting groceries, prescriptions, and other personal needs.  Monitor  the patients symptoms If they are getting sicker, call his or her medical provider and tell them that the patient has, or is being evaluated for, COVID-19 infection. This will help the healthcare providers office take steps to keep other people from getting infected. Ask the healthcare provider to call the local or state health department.  Limit the number of people who have contact with the patient If possible, have only one caregiver for the patient. Other household members should stay in another home or place of residence. If this is not possible, they should stay in another room, or be separated from the patient as much as possible. Use a separate bathroom, if available. Restrict visitors who do not have an essential need to be in the home.  Keep older adults, very young children, and other sick people away from the patient Keep older adults, very young children, and those who have compromised immune systems or chronic health conditions away from the patient. This includes people with chronic heart, lung, or kidney conditions, diabetes, and cancer.  Ensure good ventilation Make sure that shared spaces in the home have good air flow, such as from an air conditioner or an opened window, weather permitting.  Wash your hands often Wash your hands often and thoroughly with soap and water for at least 20 seconds. You can use an alcohol based hand sanitizer if soap and water are not available and if your hands are not visibly dirty. Avoid touching your eyes, nose, and mouth with unwashed hands. Use disposable paper towels to dry your hands. If not available, use dedicated cloth towels and replace them when they become wet.  Wear a facemask and gloves Wear a disposable facemask at all times in the room and gloves when you touch or have contact with the patients blood, body fluids, and/or secretions or excretions, such as sweat, saliva, sputum, nasal mucus, vomit, urine, or feces.  Ensure the  mask fits over your nose and mouth tightly, and do not touch it during use. Throw out disposable facemasks and gloves after using them. Do not reuse. Wash your hands immediately after removing your facemask and gloves. If your personal clothing becomes contaminated, carefully remove clothing and launder. Wash your hands after handling contaminated clothing. Place all used disposable facemasks, gloves, and other waste in a lined container before disposing them with other household waste. Remove gloves and wash your hands immediately after handling these items.  Do not share dishes, glasses, or other household items with the patient Avoid sharing household items. You should not share dishes, drinking glasses, cups, eating utensils, towels, bedding, or other items with a patient who is confirmed to have, or being evaluated for, COVID-19 infection. After the person uses these items, you should wash them thoroughly with soap and water.  Wash laundry thoroughly Immediately  remove and wash clothes or bedding that have blood, body fluids, and/or secretions or excretions, such as sweat, saliva, sputum, nasal mucus, vomit, urine, or feces, on them. Wear gloves when handling laundry from the patient. Read and follow directions on labels of laundry or clothing items and detergent. In general, wash and dry with the warmest temperatures recommended on the label.  Clean all areas the individual has used often Clean all touchable surfaces, such as counters, tabletops, doorknobs, bathroom fixtures, toilets, phones, keyboards, tablets, and bedside tables, every day. Also, clean any surfaces that may have blood, body fluids, and/or secretions or excretions on them. Wear gloves when cleaning surfaces the patient has come in contact with. Use a diluted bleach solution (e.g., dilute bleach with 1 part bleach and 10 parts water) or a household disinfectant with a label that says EPA-registered for coronaviruses. To make  a bleach solution at home, add 1 tablespoon of bleach to 1 quart (4 cups) of water. For a larger supply, add  cup of bleach to 1 gallon (16 cups) of water. Read labels of cleaning products and follow recommendations provided on product labels. Labels contain instructions for safe and effective use of the cleaning product including precautions you should take when applying the product, such as wearing gloves or eye protection and making sure you have good ventilation during use of the product. Remove gloves and wash hands immediately after cleaning.  Monitor yourself for signs and symptoms of illness Caregivers and household members are considered close contacts, should monitor their health, and will be asked to limit movement outside of the home to the extent possible. Follow the monitoring steps for close contacts listed on the symptom monitoring form.   ? If you have additional questions, contact your local health department or call the epidemiologist on call at [phone] (available 24/7). ? This guidance is subject to change. For the most up-to-date guidance from [facility], please refer to their website: [email or web address]

## 2018-07-31 NOTE — ED Provider Notes (Signed)
[facility] EMERGENCY DEPARTMENT Provider Note   CSN: [id number] Arrival date & time: 07/31/18  3762    History   Chief Complaint Chief Complaint  Patient presents with  . Hypertension    HPI Dale Mcintosh is a 33 y.o. male.     HPI  Patient is a 33 year old male with history of hypertension presenting for need for refill of blood pressure medications, as well as stating that his work told employees a need to be tested for COVID-19.  Patient reports that he was prescribed a 30-day supply of blood pressure medications in November 2019.  He was previously evaluated by cardiology in 2018, but lost his insurance and was unable to continue to follow-up for blood pressure management.  Patient reports that he lost the piece of paper stating that he should follow-up with.  He presents today because he has been taking his blood pressure and has noticed it is been significantly elevated.  Patient reports a mild frontal headache that he gets frequently, but denies visual disturbance, changes in speech, weakness or numbness in extremities.  He denies any chest pain or shortness of breath.  Patient denies any fevers or chills.  He does report that he coughs almost every day and attributes this to being a smoker.  This is been a problem for several months.  As a secondary issue, patient reports that he works at [employer] and multiple coworkers have tested positive for COVID-19.  His work is requesting that all employees be tested.  He was not given any further information on how to do this.  He does not know any other exposures and has not traveled.   Past Medical History:  Diagnosis Date  . Hypertension     Patient Active Problem List   Diagnosis Date Noted  . Excessive daytime sleepiness 02/01/2017  . Morning headache 02/01/2017  . Morbid obesity (HCC) 02/01/2017  . Snoring 02/01/2017  . 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 No pertinent surgical  history.      Home Medications    Prior to Admission medications   Medication Sig Start Date End Date Taking? Authorizing Provider  amLODipine (NORVASC) 10 MG tablet Take 1 tablet (10 mg total) by mouth daily. 03/09/18   Dartha Lodge, PA-C  aspirin-acetaminophen-caffeine (EXCEDRIN MIGRAINE) [phone] MG tablet Take 2 tablets by mouth every 6 (six) hours as needed for headache.    [provider]  hydrochlorothiazide (HYDRODIURIL) 25 MG tablet Take 1 tablet (25 mg total) by mouth daily. 03/09/18   Dartha Lodge, PA-C  irbesartan-hydrochlorothiazide (AVALIDE) 150-12.5 MG tablet Take 1 tablet by mouth daily. Patient not taking: Reported on 08/25/2017 01/30/17   Chrystie Nose, MD  methocarbamol (ROBAXIN) 500 MG tablet Take 1 tablet (500 mg total) by mouth 2 (two) times daily. 08/25/17   Eyvonne Mechanic, PA-C    Family History Family History  Problem Relation Age of Onset  . Hypertension Father     Social History Social History   Tobacco Use  . Smoking status: Current Every Day Smoker    Packs/day: 0.50    Types: Cigarettes  . Smokeless tobacco: Current User  Substance Use Topics  . Alcohol use: Yes    Comment: occ  . Drug use: No     Allergies   Bee venom and Shrimp [shellfish allergy]   Review of Systems Review of Systems  Constitutional: Negative for chills, fatigue and fever.  HENT: Negative for congestion, rhinorrhea, sinus pain  and sore throat.   Eyes: Negative for visual disturbance.  Respiratory: Negative for cough, chest tightness and shortness of breath.   Cardiovascular: Negative for chest pain and palpitations.  Gastrointestinal: Negative for abdominal pain, nausea and vomiting.  Genitourinary: Negative for dysuria and flank pain.  Musculoskeletal: Negative for back pain and myalgias.  Skin: Negative for rash.  Neurological: Positive for headaches. Negative for dizziness, syncope and light-headedness.     Physical Exam Updated Vital Signs  BP (!) 192/150 (BP Location: Right Arm)   Pulse (!) 105   Temp 98 F (36.7 C) (Oral)   Resp 20   Ht 5\' 6"  (1.676 m)   Wt 136.1 kg   SpO2 100%   BMI 48.42 kg/m   Physical Exam Vitals signs and nursing note reviewed.  Constitutional:      General: He is not in acute distress.    Appearance: He is well-developed. He is obese.  HENT:     Head: Normocephalic and atraumatic.     Mouth/Throat:     Mouth: Mucous membranes are moist.  Eyes:     Conjunctiva/sclera: Conjunctivae normal.     Pupils: Pupils are equal, round, and reactive to light.  Neck:     Musculoskeletal: Normal range of motion and neck supple.  Cardiovascular:     Rate and Rhythm: Normal rate and regular rhythm.     Heart sounds: S1 normal and S2 normal. No murmur.  Pulmonary:     Effort: Pulmonary effort is normal.     Breath sounds: Normal breath sounds. No wheezing or rales.  Abdominal:     General: There is no distension.     Palpations: Abdomen is soft.     Tenderness: There is no abdominal tenderness. There is no guarding.  Musculoskeletal: Normal range of motion.        General: No deformity.  Lymphadenopathy:     Cervical: No cervical adenopathy.  Skin:    General: Skin is warm and dry.     Findings: No erythema or rash.  Neurological:     Mental Status: He is alert.     Comments: Cranial nerves grossly intact. Normal speech.  Patient moves extremities symmetrically and with good coordination. Normal and symmetric gait.   Psychiatric:        Behavior: Behavior normal.        Thought Content: Thought content normal.        Judgment: Judgment normal.      ED Treatments / Results  Labs (all labs ordered are listed, but only abnormal results are displayed) Labs Reviewed  BASIC METABOLIC PANEL    EKG None  Radiology No results found.  Procedures Procedures (including critical care time)  Medications Ordered in ED Medications - No data to display   Initial Impression / Assessment  and Plan / ED Course  I have reviewed the triage vital signs and the nursing notes.  Pertinent labs & imaging results that were available during my care of the patient were reviewed by me and considered in my medical decision making (see chart for details).  Clinical Course as of Jul 31 954  Fri Jul 31, 2018  0954 Pulse 84 on my examination.  Pulse Rate(!): 105 [AM]    Clinical Course User Index [AM] Elisha Ponder, PA-C       Patient is nontoxic-appearing and in no acute distress.  Blood pressure is significantly elevated to 196/140 here.  He has a mild frontal headache, consistent with his  prior headaches.  Do not feel that it is indicative of hypertensive urgency or emergency.  He is otherwise asymptomatic.  Will restart patient's home blood pressure medications after confirmation that he does not have any electrolyte abnormalities.  Will refer patient to [facility] wellness for financial counseling.  Patient is asymptomatic of SARS-CoV-2.  He has had known positive exposures.  I recommended that the patient monitor his symptoms and self quarantine.  Informed the patient that this is not a primary testing site and we do not have capabilities to test.  Dale Mcintosh was evaluated in Emergency Department on 07/31/2018 for the symptoms described in the history of present illness. He was evaluated in the context of the global COVID-19 pandemic, which necessitated consideration that the patient might be at risk for infection with the SARS-CoV-2 virus that causes COVID-19. Institutional protocols and algorithms that pertain to the evaluation of patients at risk for COVID-19 are in a state of rapid change based on information released by regulatory bodies including the CDC and federal and state organizations. These policies and algorithms were followed during the patient's care in the ED.  Patient was given return precautions for any chest pain, shortness of breath, or difficulty  breathing, or worsening headaches.  Patient is in understanding and agrees with the plan of care.  Final Clinical Impressions(s) / ED Diagnoses   Final diagnoses:  Hypertension, unspecified type  Infectious disease exposure    ED Discharge Orders         Ordered    hydrochlorothiazide (HYDRODIURIL) 25 MG tablet  Daily with breakfast     07/31/18 1042    amLODipine (NORVASC) 10 MG tablet  Every evening     07/31/18 1042           Aviva Kluver B, PA-C 07/31/18 1507    Virgina Norfolk, DO 07/31/18 1727

## 2018-07-31 NOTE — ED Notes (Signed)
Provider spoke with patient regarding plan of care and discharge in length. Verbalized understanding of discharge instructions.

## 2018-07-31 NOTE — ED Triage Notes (Signed)
Seen 2 months ago for high blood pressure states given 30 day supply of medication however was suppose to follow up however lost this information to follow up. Patient added his job told him to come to the ED because where he works there are people that tested positive for covid19. Patient denies symptoms only here because job to him to come and to check his high blood pressure.

## 2018-08-28 ENCOUNTER — Observation Stay (HOSPITAL_COMMUNITY): Payer: Self-pay

## 2018-08-28 ENCOUNTER — Observation Stay (HOSPITAL_BASED_OUTPATIENT_CLINIC_OR_DEPARTMENT_OTHER): Payer: Self-pay

## 2018-08-28 ENCOUNTER — Observation Stay (HOSPITAL_COMMUNITY)
Admission: EM | Admit: 2018-08-28 | Discharge: 2018-08-29 | Disposition: A | Discharge status: Home / Self Care | Payer: Self-pay | Attending: Internal Medicine | Admitting: Internal Medicine

## 2018-08-28 ENCOUNTER — Other Ambulatory Visit: Payer: Self-pay

## 2018-08-28 ENCOUNTER — Encounter (HOSPITAL_COMMUNITY): Payer: Self-pay | Admitting: Emergency Medicine

## 2018-08-28 ENCOUNTER — Emergency Department (HOSPITAL_COMMUNITY): Payer: Self-pay

## 2018-08-28 DIAGNOSIS — F17210 Nicotine dependence, cigarettes, uncomplicated: Secondary | ICD-10-CM | POA: Insufficient documentation

## 2018-08-28 DIAGNOSIS — E669 Obesity, unspecified: Secondary | ICD-10-CM | POA: Insufficient documentation

## 2018-08-28 DIAGNOSIS — I5020 Unspecified systolic (congestive) heart failure: Secondary | ICD-10-CM | POA: Insufficient documentation

## 2018-08-28 DIAGNOSIS — I161 Hypertensive emergency: Secondary | ICD-10-CM

## 2018-08-28 DIAGNOSIS — I110 Hypertensive heart disease with heart failure: Secondary | ICD-10-CM | POA: Insufficient documentation

## 2018-08-28 DIAGNOSIS — R778 Other specified abnormalities of plasma proteins: Secondary | ICD-10-CM

## 2018-08-28 DIAGNOSIS — Z79899 Other long term (current) drug therapy: Secondary | ICD-10-CM | POA: Insufficient documentation

## 2018-08-28 DIAGNOSIS — R079 Chest pain, unspecified: Secondary | ICD-10-CM

## 2018-08-28 DIAGNOSIS — Z7982 Long term (current) use of aspirin: Secondary | ICD-10-CM | POA: Insufficient documentation

## 2018-08-28 DIAGNOSIS — R7989 Other specified abnormal findings of blood chemistry: Secondary | ICD-10-CM

## 2018-08-28 DIAGNOSIS — R0789 Other chest pain: Principal | ICD-10-CM | POA: Insufficient documentation

## 2018-08-28 HISTORY — DX: Obstructive sleep apnea (adult) (pediatric): G47.33

## 2018-08-28 LAB — BRAIN NATRIURETIC PEPTIDE: B Natriuretic Peptide: 136.4 pg/mL — ABNORMAL HIGH (ref 0.0–100.0)

## 2018-08-28 LAB — CBC
HCT: 44.1 % (ref 39.0–52.0)
Hemoglobin: 13.7 g/dL (ref 13.0–17.0)
MCH: 27.2 pg (ref 26.0–34.0)
MCHC: 31.1 g/dL (ref 30.0–36.0)
MCV: 87.5 fL (ref 80.0–100.0)
Platelets: 180 10*3/uL (ref 150–400)
RBC: 5.04 MIL/uL (ref 4.22–5.81)
RDW: 15.4 % (ref 11.5–15.5)
WBC: 10.3 10*3/uL (ref 4.0–10.5)
nRBC: 0 % (ref 0.0–0.2)

## 2018-08-28 LAB — RAPID URINE DRUG SCREEN, HOSP PERFORMED
Amphetamines: NOT DETECTED
Barbiturates: NOT DETECTED
Benzodiazepines: NOT DETECTED
Cocaine: NOT DETECTED
Opiates: NOT DETECTED
Tetrahydrocannabinol: POSITIVE — AB

## 2018-08-28 LAB — CK TOTAL AND CKMB (NOT AT ARMC)
CK, MB: 2.9 ng/mL (ref 0.5–5.0)
Relative Index: 1.1 (ref 0.0–2.5)
Total CK: 276 U/L (ref 49–397)

## 2018-08-28 LAB — ECHOCARDIOGRAM COMPLETE
Height: 70 in
Weight: 4850.12 oz

## 2018-08-28 LAB — BASIC METABOLIC PANEL
Anion gap: 13 (ref 5–15)
BUN: 18 mg/dL (ref 6–20)
CO2: 23 mmol/L (ref 22–32)
Calcium: 9.5 mg/dL (ref 8.9–10.3)
Chloride: 104 mmol/L (ref 98–111)
Creatinine, Ser: 1.27 mg/dL — ABNORMAL HIGH (ref 0.61–1.24)
GFR calc Af Amer: >60 mL/min (ref >60)
GFR calc non Af Amer: >60 mL/min (ref >60)
Glucose, Bld: 98 mg/dL (ref 70–99)
Potassium: 3.5 mmol/L (ref 3.5–5.1)
Sodium: 140 mmol/L (ref 135–145)

## 2018-08-28 LAB — LIPID PANEL
Cholesterol: 169 mg/dL (ref 0–200)
HDL: 43 mg/dL (ref >40)
LDL Cholesterol: 109 mg/dL — ABNORMAL HIGH (ref 0–99)
Total CHOL/HDL Ratio: 3.9 RATIO
Triglycerides: 85 mg/dL (ref <150)
VLDL: 17 mg/dL (ref 0–40)

## 2018-08-28 LAB — TROPONIN I
Troponin I: 0.05 ng/mL (ref <0.03)
Troponin I: 0.05 ng/mL (ref <0.03)
Troponin I: 0.04 ng/mL (ref <0.03)
Troponin I: 0.07 ng/mL (ref <0.03)

## 2018-08-28 LAB — D-DIMER, QUANTITATIVE (NOT AT ARMC): D-Dimer, Quant: 0.29 ug/mL-FEU (ref 0.00–0.50)

## 2018-08-28 LAB — HIV ANTIBODY (ROUTINE TESTING W REFLEX): HIV Screen 4th Generation wRfx: NONREACTIVE

## 2018-08-28 MED ORDER — SODIUM CHLORIDE 0.9% FLUSH
3.0000 mL | Freq: Once | INTRAVENOUS | Status: AC
  Administered 2018-08-28: 3 mL via INTRAVENOUS

## 2018-08-28 MED ORDER — ENOXAPARIN SODIUM 40 MG/0.4ML ~~LOC~~ SOLN
40.0000 mg | SUBCUTANEOUS | Status: DC
  Administered 2018-08-28: 40 mg via SUBCUTANEOUS
  Filled 2018-08-28 (×2): qty 0.4

## 2018-08-28 MED ORDER — LABETALOL HCL 5 MG/ML IV SOLN
20.0000 mg | Freq: Once | INTRAVENOUS | Status: AC
  Administered 2018-08-28: 20 mg via INTRAVENOUS
  Filled 2018-08-28: qty 4

## 2018-08-28 MED ORDER — AMLODIPINE BESYLATE 10 MG PO TABS: 10.0000 mg | ORAL_TABLET | Freq: Every evening | ORAL | Status: DC

## 2018-08-28 MED ORDER — NITROGLYCERIN 0.4 MG SL SUBL
SUBLINGUAL_TABLET | SUBLINGUAL | Status: AC
  Filled 2018-08-28: qty 1

## 2018-08-28 MED ORDER — ONDANSETRON HCL 4 MG/2ML IJ SOLN
4.0000 mg | Freq: Four times a day (QID) | INTRAMUSCULAR | Status: DC | PRN
  Filled 2018-08-28: qty 2

## 2018-08-28 MED ORDER — AMLODIPINE BESYLATE 10 MG PO TABS
10.0000 mg | ORAL_TABLET | Freq: Every day | ORAL | Status: DC
  Administered 2018-08-28 – 2018-08-29 (×2): 10 mg via ORAL
  Filled 2018-08-28 (×2): qty 1

## 2018-08-28 MED ORDER — CARVEDILOL 12.5 MG PO TABS
12.5000 mg | ORAL_TABLET | Freq: Two times a day (BID) | ORAL | Status: DC
  Administered 2018-08-28: 12.5 mg via ORAL
  Filled 2018-08-28: qty 1

## 2018-08-28 MED ORDER — HYDRALAZINE HCL 20 MG/ML IJ SOLN: 10.0000 mg | Freq: Four times a day (QID) | INTRAMUSCULAR | Status: DC | PRN

## 2018-08-28 MED ORDER — HYDROCHLOROTHIAZIDE 25 MG PO TABS: 25.0000 mg | ORAL_TABLET | Freq: Every day | ORAL | Status: DC

## 2018-08-28 MED ORDER — SODIUM CHLORIDE 0.9% FLUSH
3.0000 mL | Freq: Two times a day (BID) | INTRAVENOUS | Status: DC
  Administered 2018-08-28 – 2018-08-29 (×3): 3 mL via INTRAVENOUS

## 2018-08-28 MED ORDER — HYDRALAZINE HCL 20 MG/ML IJ SOLN
20.0000 mg | Freq: Four times a day (QID) | INTRAMUSCULAR | Status: DC | PRN
  Administered 2018-08-28: 20 mg via INTRAVENOUS
  Filled 2018-08-28: qty 1

## 2018-08-28 MED ORDER — CARVEDILOL 25 MG PO TABS
25.0000 mg | ORAL_TABLET | Freq: Two times a day (BID) | ORAL | Status: DC
  Administered 2018-08-28 – 2018-08-29 (×2): 25 mg via ORAL
  Filled 2018-08-28 (×2): qty 1

## 2018-08-28 MED ORDER — CARVEDILOL 12.5 MG PO TABS
12.5000 mg | ORAL_TABLET | Freq: Once | ORAL | Status: AC
  Administered 2018-08-28: 12.5 mg via ORAL
  Filled 2018-08-28: qty 1

## 2018-08-28 MED ORDER — NITROGLYCERIN 0.4 MG SL SUBL
0.4000 mg | SUBLINGUAL_TABLET | SUBLINGUAL | Status: DC | PRN
  Administered 2018-08-28: 0.4 mg via SUBLINGUAL

## 2018-08-28 MED ORDER — METOPROLOL TARTRATE 5 MG/5ML IV SOLN
5.0000 mg | INTRAVENOUS | Status: DC | PRN
  Administered 2018-08-28 (×2): 5 mg via INTRAVENOUS
  Filled 2018-08-28: qty 5

## 2018-08-28 MED ORDER — ACETAMINOPHEN 325 MG PO TABS
650.0000 mg | ORAL_TABLET | Freq: Four times a day (QID) | ORAL | Status: DC | PRN
  Administered 2018-08-28 (×2): 650 mg via ORAL
  Filled 2018-08-28: qty 2

## 2018-08-28 MED ORDER — SODIUM CHLORIDE 0.9% FLUSH: 3.0000 mL | INTRAVENOUS | Status: DC | PRN

## 2018-08-28 MED ORDER — IVABRADINE HCL 5 MG PO TABS
2.5000 mg | ORAL_TABLET | Freq: Once | ORAL | Status: AC
  Administered 2018-08-28: 2.5 mg via ORAL
  Filled 2018-08-28: qty 1

## 2018-08-28 MED ORDER — ASPIRIN 81 MG PO CHEW
324.0000 mg | CHEWABLE_TABLET | Freq: Once | ORAL | Status: AC
  Administered 2018-08-28: 324 mg via ORAL
  Filled 2018-08-28: qty 4

## 2018-08-28 MED ORDER — SODIUM CHLORIDE 0.9 % IV SOLN: 250.0000 mL | INTRAVENOUS | Status: DC | PRN

## 2018-08-28 MED ORDER — IOHEXOL 350 MG/ML SOLN
100.0000 mL | Freq: Once | INTRAVENOUS | Status: AC | PRN
  Administered 2018-08-28: 100 mL via INTRAVENOUS

## 2018-08-28 MED ORDER — SODIUM CHLORIDE 0.9 % IV BOLUS
250.0000 mL | Freq: Once | INTRAVENOUS | Status: AC
  Administered 2018-08-28: 250 mL via INTRAVENOUS

## 2018-08-28 MED ORDER — NICOTINE 21 MG/24HR TD PT24: 21.0000 mg | MEDICATED_PATCH | Freq: Every day | TRANSDERMAL | Status: DC | PRN

## 2018-08-28 MED ORDER — HYDROCHLOROTHIAZIDE 25 MG PO TABS
25.0000 mg | ORAL_TABLET | Freq: Every day | ORAL | Status: DC
  Administered 2018-08-28 – 2018-08-29 (×2): 25 mg via ORAL
  Filled 2018-08-28 (×2): qty 1

## 2018-08-28 MED ORDER — ATORVASTATIN CALCIUM 80 MG PO TABS
80.0000 mg | ORAL_TABLET | Freq: Every day | ORAL | Status: AC
  Administered 2018-08-28: 80 mg via ORAL
  Filled 2018-08-28: qty 1

## 2018-08-28 MED ORDER — IVABRADINE HCL 5 MG PO TABS
5.0000 mg | ORAL_TABLET | Freq: Two times a day (BID) | ORAL | Status: DC
  Administered 2018-08-28 – 2018-08-29 (×2): 5 mg via ORAL
  Filled 2018-08-28 (×3): qty 1

## 2018-08-28 MED ORDER — ACETAMINOPHEN 650 MG RE SUPP: 650.0000 mg | Freq: Four times a day (QID) | RECTAL | Status: DC | PRN

## 2018-08-28 MED ORDER — ASPIRIN EC 325 MG PO TBEC
325.0000 mg | DELAYED_RELEASE_TABLET | Freq: Every day | ORAL | Status: DC
  Administered 2018-08-29: 325 mg via ORAL
  Filled 2018-08-28: qty 1

## 2018-08-28 MED ORDER — DILTIAZEM HCL 25 MG/5ML IV SOLN
10.0000 mg | Freq: Once | INTRAVENOUS | Status: DC
  Filled 2018-08-28: qty 5

## 2018-08-28 MED ORDER — HYDRALAZINE HCL 20 MG/ML IJ SOLN: 20.0000 mg | Freq: Four times a day (QID) | INTRAMUSCULAR | Status: DC | PRN

## 2018-08-28 MED ORDER — CARVEDILOL 12.5 MG PO TABS: 12.5000 mg | ORAL_TABLET | Freq: Two times a day (BID) | ORAL | Status: DC

## 2018-08-28 NOTE — H&P (Addendum)
TRH H&P    Patient Demographics:    Dale Mcintosh, is a 33 y.o. male  MRN: [id number]  DOB - 05-Nov-1985  Admit Date - 08/28/2018  Referring MD/NP/PA: Manson Passey PA  Outpatient Primary MD for the patient is Patient, No Pcp Per  Patient coming from: home  Chief complaint-  Chest pain   HPI:    Dale Mcintosh  is a 33 y.o. male,w  Hypertension, apparently c/o chest pain  " sharp" without radiation for the past 1 week.  Pt denies fever, chills, palp, sob, n/v, diarrhea, brbpr, lower ext edema.  Pt states that nothing appeared to make the chest pain better or worse.  Pt states sharp, chest pain , substernal awoke him from sleep at about 3 am and lasted for 3 minutes.  Pt states that he has been compliant with his bp medication, but admits to eating salty food.    In ED T 98.5  P 107, currently 89  R 13-29, currently 13, Bp 198/138  CXR IMPRESSION: Cardiomegaly, vascular congestion.  Na 140, K 3.5   Bun 18, Creatinine 1.27 Wbc 10.3, Hgb 13.7  Plt 180 Trop 0.05 BNP 136.4 D dimer 0.29  EKG nsr at 98, nl axis, nl int, + LVH, t inversion in 3, avf, and v5,6 as well as slight st depression in v5,6, (t inversion is old), also hint of st elevation in v2 (old)  Pt will be admitted for uncontrolled hypertension as well as chest pain.     Review of systems:    In addition to the HPI above,  No Fever-chills, No Headache, No changes with Vision or hearing, No problems swallowing food or Liquids, No Cough or Shortness of Breath, No Abdominal pain, No Nausea or Vomiting, bowel movements are regular, No Blood in stool or Urine, No dysuria, No new skin rashes or bruises, No new joints pains-aches,  No new weakness, tingling, numbness in any extremity, No recent weight gain or loss, No polyuria, polydypsia or polyphagia, No significant Mental Stressors.  All other systems reviewed and are negative.    Past  History of the following :    Past Medical History:  Diagnosis Date  . Hypertension   . OSA (obstructive sleep apnea)       History reviewed. No pertinent surgical history.    Social History:      Social History   Tobacco Use  . Smoking status: Current Every Day Smoker    Packs/day: 0.50    Types: Cigarettes  . Smokeless tobacco: Current User  Substance Use Topics  . Alcohol use: Yes    Comment: occ       Family History :     Family History  Problem Relation Age of Onset  . Hypertension Father        Home Medications:   Prior to Admission medications   Medication Sig Start Date End Date Taking? Authorizing Provider  amLODipine (NORVASC) 10 MG tablet Take 1 tablet (10 mg total) by mouth every evening. 07/31/18   Elisha Ponder, PA-C  aspirin-acetaminophen-caffeine (EXCEDRIN MIGRAINE) 250-250-65 MG tablet Take 2 tablets by mouth every 6 (six) hours as needed for headache.    [provider]  hydrochlorothiazide (HYDRODIURIL) 25 MG tablet Take 1 tablet (25 mg total) by mouth daily with breakfast. 07/31/18   Aviva KluverMurray, Alyssa B, PA-C  methocarbamol (ROBAXIN) 500 MG tablet Take 1 tablet (500 mg total) by mouth 2 (two) times daily. 08/25/17   Eyvonne MechanicHedges, Jeffrey, PA-C     Allergies:     Allergies  Allergen Reactions  . Bee Venom Anaphylaxis  . Shrimp [Shellfish Allergy] Itching     Physical Exam:   Vitals  Blood pressure (!) 198/138, pulse 89, temperature 98.5 F (36.9 C), temperature source Oral, resp. rate (!) 24, height 5\' 10"  (1.778 m), weight 135.2 kg, SpO2 96 %.  1.  General: axox3  2. Psychiatric: euthymic  3. Neurologic: cn2-12 intact, reflexes 2+ symmetric, diffuse, motor 5/5 in all 4 ext  4. HEENMT:  Anicteric, pupils 1.565mm symmetric, direct, consensual, near intact  5. Respiratory : CTAB  6. Cardiovascular : rrr s1, s2, no m/g/r  7. Gastrointestinal:  Abd: soft, obsese, nt, +bs  8. Skin:  Ext: no c/c/e, no rash   9.Musculoskeletal:  Good ROM  No adenopathy    Data Review:    CBC Recent Labs  Lab 08/28/18 0314  WBC 10.3  HGB 13.7  HCT 44.1  PLT 180  MCV 87.5  MCH 27.2  MCHC 31.1  RDW 15.4   ------------------------------------------------------------------------------------------------------------------  Results for orders placed or performed during the hospital encounter of 08/28/18 (from the past 48 hour(s))  Basic metabolic panel     Status: Abnormal   Collection Time: 08/28/18  3:14 AM  Result Value Ref Range   Sodium 140 135 - 145 mmol/L   Potassium 3.5 3.5 - 5.1 mmol/L   Chloride 104 98 - 111 mmol/L   CO2 23 22 - 32 mmol/L   Glucose, Bld 98 70 - 99 mg/dL   BUN 18 6 - 20 mg/dL   Creatinine, Ser 4.091.27 (H) 0.61 - 1.24 mg/dL   Calcium 9.5 8.9 - 81.110.3 mg/dL   GFR calc non Af Amer >60 >60 mL/min   GFR calc Af Amer >60 >60 mL/min   Anion gap 13 5 - 15    Comment: Performed at [facility], [address] [place], [place] [address]  CBC     Status: None   Collection Time: 08/28/18  3:14 AM  Result Value Ref Range   WBC 10.3 4.0 - 10.5 K/uL   RBC 5.04 4.22 - 5.81 MIL/uL   Hemoglobin 13.7 13.0 - 17.0 g/dL   HCT 29.544.1 62.139.0 - 30.852.0 %   MCV 87.5 80.0 - 100.0 fL   MCH 27.2 26.0 - 34.0 pg   MCHC 31.1 30.0 - 36.0 g/dL   RDW 65.715.4 84.611.5 - 96.215.5 %   Platelets 180 150 - 400 K/uL   nRBC 0.0 0.0 - 0.2 %    Comment: Performed at [facility], [address] [place], [place] [address]  Troponin I - ONCE - STAT     Status: Abnormal   Collection Time: 08/28/18  3:14 AM  Result Value Ref Range   Troponin I 0.05 (HH) <0.03 ng/mL    Comment: CRITICAL RESULT CALLED TO, READ BACK BY AND VERIFIED WITH: TOBIAS M,RN 08/28/18 0507 WAYK Performed at [facility], [address] [place], [place] [address]   D-dimer, quantitative (not at [facility])  Status: None   Collection Time: 08/28/18  3:14 AM  Result Value Ref Range   D-Dimer, Quant 0.29 0.00 - 0.50 ug/mL-FEU    Comment:  (NOTE) At the manufacturer cut-off of 0.50 ug/mL FEU, this assay has been documented to exclude PE with a sensitivity and negative predictive value of 97 to 99%.  At this time, this assay has not been approved by the FDA to exclude DVT/VTE. Results should be correlated with clinical presentation. Performed at [facility], [address], [place], [place] [address]   Brain natriuretic peptide     Status: Abnormal   Collection Time: 08/28/18  3:14 AM  Result Value Ref Range   B Natriuretic Peptide 136.4 (H) 0.0 - 100.0 pg/mL    Comment: Performed at [facility], [address] [place], [place] [address]    Chemistries  Recent Labs  Lab 08/28/18 0314  NA 140  K 3.5  CL 104  CO2 23  GLUCOSE 98  BUN 18  CREATININE 1.27*  CALCIUM 9.5   ------------------------------------------------------------------------------------------------------------------  ------------------------------------------------------------------------------------------------------------------ GFR: Estimated Creatinine Clearance: 115.6 mL/min (A) (by C-G formula based on SCr of 1.27 mg/dL (H)). Liver Function Tests: No results for input(s): AST, ALT, ALKPHOS, BILITOT, PROT, ALBUMIN in the last 168 hours. No results for input(s): LIPASE, AMYLASE in the last 168 hours. No results for input(s): AMMONIA in the last 168 hours. Coagulation Profile: No results for input(s): INR, PROTIME in the last 168 hours. Cardiac Enzymes: Recent Labs  Lab 08/28/18 0314  TROPONINI 0.05*   BNP (last 3 results) No results for input(s): PROBNP in the last 8760 hours. HbA1C: No results for input(s): HGBA1C in the last 72 hours. CBG: No results for input(s): GLUCAP in the last 168 hours. Lipid Profile: No results for input(s): CHOL, HDL, LDLCALC, TRIG, CHOLHDL, LDLDIRECT in the last 72 hours. Thyroid Function Tests: No results for input(s): TSH, T4TOTAL, FREET4, T3FREE, THYROIDAB in the last 72 hours. Anemia  Panel: No results for input(s): VITAMINB12, FOLATE, FERRITIN, TIBC, IRON, RETICCTPCT in the last 72 hours.  --------------------------------------------------------------------------------------------------------------- Urine analysis:    Component Value Date/Time   COLORURINE YELLOW 12/03/2017 1205   APPEARANCEUR CLEAR 12/03/2017 1205   LABSPEC 1.029 12/03/2017 1205   PHURINE 5.0 12/03/2017 1205   GLUCOSEU NEGATIVE 12/03/2017 1205   HGBUR NEGATIVE 12/03/2017 1205   BILIRUBINUR NEGATIVE 12/03/2017 1205   KETONESUR 5 (A) 12/03/2017 1205   PROTEINUR NEGATIVE 12/03/2017 1205   UROBILINOGEN 0.2 12/02/2017 1504   NITRITE NEGATIVE 12/03/2017 1205   LEUKOCYTESUR NEGATIVE 12/03/2017 1205      Imaging Results:    Dg Chest 2 View  Result Date: 08/28/2018 CLINICAL DATA:  Chest pain EXAM: CHEST - 2 VIEW COMPARISON:  03/09/2018 FINDINGS: Cardiomegaly with vascular congestion. No confluent opacities, effusions or edema. No acute bony abnormality. IMPRESSION: Cardiomegaly, vascular congestion. Electronically Signed   By: Charlett Nose M.D.   On: 08/28/2018 03:43      Assessment & Plan:    Principal Problem:   Chest pain Active Problems:   Hypertensive emergency  Chest pain, Cardiomegaly Tele Trop I q6h x3 Check cardiac echo Check lipid Start aspirin  po qday Start Lipitor  po qday x 1 dose Start Carvedilol as below  Troponin elevation Cycle cardiac markers Check cpk, mb Consider cardiology consult this AM,  ED spoke with Dr. Shirlee Latch and per ED probably related to hypertensive emergency.  Hypertensive emergency Start carvediloll 12.5mg  po bid Cont amlodipine  po qday Cont hydrochlorothiazide   po qday Hydralazine 10mg  iv q6h prn sbp >160 Check cbc, cmp in am  Tobacco use counselled on smoking cessation x 3 minutes Nicotine patch prn    DVT Prophylaxis-   Lovenox - SCDs  AM Labs Ordered, also please review Full Orders  Family Communication: Admission,  patients condition and plan of care including tests being ordered have been discussed with the patient  who indicate understanding and agree with the plan and Code Status.  Code Status:  FULL CODE  Admission status: Observation: Based on patients clinical presentation and evaluation of above clinical data, I have made determination that patient meets observation criteria at this time.  Time spent in minutes : 70   Pearson Grippe M.D on 08/28/2018 at 5:46 AM

## 2018-08-28 NOTE — Progress Notes (Signed)
  Echocardiogram 2D Echocardiogram has been performed.  Dale Mcintosh 08/28/2018, 12:29 PM

## 2018-08-28 NOTE — Progress Notes (Signed)
Giving bolus of NS to bring BP back up via MD Mclean. In a few hours we will readdress BP and see if BB can be given for CT of heart. I will continue to monitor the patient closely.   Sheppard Evens RN

## 2018-08-28 NOTE — ED Provider Notes (Signed)
[facility] EMERGENCY DEPARTMENT Provider Note   CSN: [id number] Arrival date & time: 08/28/18  0259    History   Chief Complaint Chief Complaint  Patient presents with  . Chest Pain    HPI Dale Mcintosh is a 33 y.o. male.     Patient presents to the emergency department with a chief complaint of chest pain.  He states the pain awoke him from sleep.  Describes it as a crushing sensation with associated shortness of breath.  He has not taken anything for the symptoms.  He states that he has had similar symptoms in the past.  He has very poorly controlled hypertension.  He smokes daily.  Denies any known close family members with early heart disease.  The history is provided by the patient. No language interpreter was used.    Past Medical History:  Diagnosis Date  . Hypertension   . OSA (obstructive sleep apnea)     Patient Active Problem List   Diagnosis Date Noted  . Excessive daytime sleepiness 02/01/2017  . Morning headache 02/01/2017  . Morbid obesity (HCC) 02/01/2017  . Snoring 02/01/2017  . 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 No pertinent surgical history.      Home Medications    Prior to Admission medications   Medication Sig Start Date End Date Taking? Authorizing Provider  amLODipine (NORVASC) 10 MG tablet Take 1 tablet (10 mg total) by mouth every evening. 07/31/18   Aviva Kluver B, PA-C  aspirin-acetaminophen-caffeine (EXCEDRIN MIGRAINE) [phone] MG tablet Take 2 tablets by mouth every 6 (six) hours as needed for headache.    [provider]  hydrochlorothiazide (HYDRODIURIL) 25 MG tablet Take 1 tablet (25 mg total) by mouth daily with breakfast. 07/31/18   Aviva Kluver B, PA-C  methocarbamol (ROBAXIN) 500 MG tablet Take 1 tablet (500 mg total) by mouth 2 (two) times daily. 08/25/17   Eyvonne Mechanic, PA-C    Family History Family History  Problem Relation Age of Onset  . Hypertension Father      Social History Social History   Tobacco Use  . Smoking status: Current Every Day Smoker    Packs/day: 0.50    Types: Cigarettes  . Smokeless tobacco: Current User  Substance Use Topics  . Alcohol use: Yes    Comment: occ  . Drug use: No     Allergies   Bee venom and Shrimp [shellfish allergy]   Review of Systems Review of Systems  All other systems reviewed and are negative.    Physical Exam Updated Vital Signs BP (!) 166/119   Pulse 88   Temp 98.5 F (36.9 C) (Oral)   Resp (!) 24   Ht 5\' 10"  (1.778 m)   Wt 135.2 kg   SpO2 97%   BMI 42.76 kg/m   Physical Exam Vitals signs and nursing note reviewed.  Constitutional:      Appearance: He is well-developed.  HENT:     Head: Normocephalic and atraumatic.  Eyes:     Conjunctiva/sclera: Conjunctivae normal.  Neck:     Musculoskeletal: Neck supple.  Cardiovascular:     Rate and Rhythm: Normal rate and regular rhythm.     Heart sounds: No murmur.  Pulmonary:     Effort: Pulmonary effort is normal. No respiratory distress.     Breath sounds: Normal breath sounds.  Abdominal:     Palpations: Abdomen is soft.     Tenderness: There is no abdominal tenderness.  Skin:  General: Skin is warm and dry.  Neurological:     Mental Status: He is alert and oriented to person, place, and time.  Psychiatric:        Mood and Affect: Mood normal.        Behavior: Behavior normal.      ED Treatments / Results  Labs (all labs ordered are listed, but only abnormal results are displayed) Labs Reviewed  BASIC METABOLIC PANEL - Abnormal; Notable for the following components:      Result Value   Creatinine, Ser 1.27 (*)    All other components within normal limits  TROPONIN I - Abnormal; Notable for the following components:   Troponin I 0.05 (*)    All other components within normal limits  BRAIN NATRIURETIC PEPTIDE - Abnormal; Notable for the following components:   B Natriuretic Peptide 136.4 (*)    All other  components within normal limits  CBC  D-DIMER, QUANTITATIVE (NOT AT [facility])    EKG EKG Interpretation  Date/Time:  Friday August 28 2018 03:13:17 EDT Ventricular Rate:  98 PR Interval:    QRS Duration: 91 QT Interval:  357 QTC Calculation: 456 R Axis:   80 Text Interpretation:  Sinus rhythm Consider right atrial enlargement LVH with secondary repolarization abnormality No significant change since last tracing Confirmed by Gilda Crease [phone]) on 08/28/2018 3:15:32 AM   Radiology Dg Chest 2 View  Result Date: 08/28/2018 CLINICAL DATA:  Chest pain EXAM: CHEST - 2 VIEW COMPARISON:  03/09/2018 FINDINGS: Cardiomegaly with vascular congestion. No confluent opacities, effusions or edema. No acute bony abnormality. IMPRESSION: Cardiomegaly, vascular congestion. Electronically Signed   By: Charlett Nose M.D.   On: 08/28/2018 03:43    Procedures Procedures (including critical care time) CRITICAL CARE Performed by: Roxy Horseman Elevated troponin- 0.05.  Hypertensive emergency.  Total critical care time: 37 minutes  Critical care time was exclusive of separately billable procedures and treating other patients.  Critical care was necessary to treat or prevent imminent or life-threatening deterioration.  Critical care was time spent personally by me on the following activities: development of treatment plan with patient and/or surrogate as well as nursing, discussions with consultants, evaluation of patient's response to treatment, examination of patient, obtaining history from patient or surrogate, ordering and performing treatments and interventions, ordering and review of laboratory studies, ordering and review of radiographic studies, pulse oximetry and re-evaluation of patient's condition.  Medications Ordered in ED Medications  aspirin chewable tablet 324 mg (has no administration in time range)  labetalol (NORMODYNE) injection 20 mg (has no administration in time range)   sodium chloride flush (NS) 0.9 % injection 3 mL (3 mLs Intravenous Given 08/28/18 0315)     Initial Impression / Assessment and Plan / ED Course  I have reviewed the triage vital signs and the nursing notes.  Pertinent labs & imaging results that were available during my care of the patient were reviewed by me and considered in my medical decision making (see chart for details).        Patient with central crushing chest pain that awakened him from sleep.  Patient has malignant hypertension, noted to be 200/136 in triage.  Prior records show multiple visits for hypertension.  Patient has had some associated shortness of breath with this chest pain.  His EKG shows no acute ischemic changes, but his troponin is 0.05.  I discussed this with Dr. Shirlee Latch from cardiology, who suspects that this elevation is due to the patient's hypertension.  Suggest that patient be admitted for hypertensive emergency.  Case discussed with Dr. Blinda Leatherwood, who agrees with the plan.  Current blood pressure is 166/119, will continue to bring in patient's map down to approximately 115.  Patient given a dose of aspirin.  D-dimer is negative, doubt PE.  Patient does have some vascular congestion seen on his chest x-ray, and BNP is above the normal range, but without any priors to compare to.   Will consult hospitalist for admission.  Appreciate Dr. Selena Batten for admitting.  Final Clinical Impressions(s) / ED Diagnoses   Final diagnoses:  Hypertensive emergency  Elevated troponin    ED Discharge Orders    None       Roxy Horseman, PA-C 08/28/18 0543    Gilda Crease, MD 08/28/18 [phone]

## 2018-08-28 NOTE — ED Notes (Signed)
Patient transported to X-ray 

## 2018-08-28 NOTE — Progress Notes (Signed)
Patient ID: Dale Mcintosh, male   DOB: 03-19-1986, 33 y.o.   MRN: [id number] Patient was admitted early this morning for chest pain and extremely elevated blood pressure.  Patient seen and examined at bedside.  She is sleepy, wakes up on calling his name but denies current chest pain.  I have reviewed his medical records including this morning's H&P myself.  I reviewed his current vitals, labs.  I have consulted cardiology.  Follow labs including troponins.  Follow 2D echo.

## 2018-08-28 NOTE — Care Management (Signed)
1531 08-28-18 Patient is without PCP at this time. CM did call the [facility] and hospital follow up appointment scheduled. Information placed on AVS- Patient will be able to utilize the [facility] once established- mail order as of now since COVID-19. CM will continue to monitor for additional transition of care needs.

## 2018-08-28 NOTE — ED Triage Notes (Signed)
Pt reports waking up feeling like "someone was kicking my chest." States the last time he felt this way he was told that he had "a heart attack", though states he was never admitted.

## 2018-08-28 NOTE — Progress Notes (Signed)
   08/28/18 1243  Vitals  BP 122/76  MAP (mmHg) 89  ECG Heart Rate 80  MEWS Score  MEWS RR 0  MEWS Pulse 0  MEWS Systolic 0  MEWS LOC 0  MEWS Temp 0  MEWS Score 0  MEWS Score Color Green   Notified MD Alekh about BP drop from ~170/130 patient not feeling well, having hot and cold flashes and  nausea. Zofran helped with nausea. MD to making changes to PRN BP medications that were given.I will continue to monitor the patient closely.   Sheppard Evens RN

## 2018-08-28 NOTE — ED Notes (Signed)
Admitting Provider at bedside. 

## 2018-08-28 NOTE — Consult Note (Signed)
Cardiology Consultation:   Dale Mcintosh ID: Dale Mcintosh; [id number]; [birth year]   Admit date: 08/28/2018 Date of Consult: 08/28/2018  Primary Care Provider: Patient, No Pcp Per Primary Cardiologist: Dr. Rennis Golden, MD  Dale Mcintosh Profile:   Dale Mcintosh is a 33 y.o. male with a hx of hypertension who is being seen today for the evaluation of chest pain at the request of Dr. Selena Batten.  History of Present Illness:   Dale Mcintosh is a 33 year old male with a history as stated above who presented to [facility] on 08/28/2018 with complaints of midsternal, nonradiating chest pain that began approximately 3 AM this morning.  Dale Mcintosh reports this pain woke him from his sleep and lasted several minutes and then began to ease on its own.  Dale Mcintosh is experienced this in the past and was last seen in the emergency department 07/31/2018 for similar symptoms.  Dale Mcintosh was prescribed amlodipine 10 mg as well as hydrochlorothiazide 25 mg and was discharged.  His symptoms were thought to be related to elevated BP with a presenting pressure of 196/140 with associated frontal headache.  Dale Mcintosh reports after ED arrival, his pain has not recurred.  Dale Mcintosh denies associated shortness of breath, palpitations, LE swelling, orthopnea symptoms, PND or syncope.  Dale Mcintosh works in [employer] and walks frequently with his job with no reports of exertional chest discomfort.   Dale Mcintosh was last seen by our service during an office visit 01/30/2017 by Dr. Rennis Golden for follow-up of HTN. Per chart review, Dale Mcintosh was seen in the emergency department 12/05/2016 with complaints of chest pain.  At that time, Dale Mcintosh was not on any medications for his hypertension.  Dale Mcintosh had reported a syncopal episode however this was not confirmed.  His QTC on EKG was noted to be greater than with repeat EKG on office visit with QTC of . His BP was noted to be 178/122 and Dale Mcintosh was placed on amlodipine 5 mg daily by his primary care provider and reported compliance.  At that time, Dale Mcintosh denied further chest pain however  reported excessive daytime sleepiness.  His amlodipine was increased to 10 mg daily and was started on irbesartan/HCTZ 150/12.5 mg daily. Plans were to return for lab follow-up 1 week after start of ARB and follow-up hypertension 2 weeks after that.  Does not appear that Dale Mcintosh returned for any follow-up.   Dale Mcintosh reports that Dale Mcintosh is not have a regular PCP and states that Dale Mcintosh comes to the emergency department for his medication refills.  Prior to his last ED visit, Dale Mcintosh had not been taking his antihypertensives for approximately 1 year.  Dale Mcintosh has no prior family or personal history of CAD.   In the ED, presenting BP was 198/138.  HR 107.  CXR with cardiomegaly and vascular congestion.  EKG with NSR and evidence of LVH and nonspecific T wave abnormalities, similar to prior tracing from 03/2018 and no acute ischemic changes.  Troponin found to be mildly elevated but flat at 0.05, 0.05 with no recurrent chest discomfort.  Cardiology has been asked to evaluate given his elevated troponin and hypertensive urgency.  Past Medical History:  Diagnosis Date   Hypertension    OSA (obstructive sleep apnea)     Past Surgical History:  Procedure Laterality Date   NO PAST SURGERIES       Prior to Admission medications   Medication Sig Start Date End Date Taking? Authorizing Provider  amLODipine (NORVASC) 10 MG tablet Take 1 tablet (10 mg total) by mouth every evening. 07/31/18  Yes Aviva Kluver B, PA-C  hydrochlorothiazide (HYDRODIURIL) 25 MG tablet Take 1 tablet (25 mg total) by mouth daily with breakfast. 07/31/18  Yes Aviva Kluver B, PA-C  aspirin-acetaminophen-caffeine (EXCEDRIN MIGRAINE) [phone] MG tablet Take 2 tablets by mouth every 6 (six) hours as needed for headache.    [provider]  methocarbamol (ROBAXIN) 500 MG tablet Take 1 tablet (500 mg total) by mouth 2 (two) times daily. 08/25/17   Eyvonne Mechanic, PA-C   Inpatient Medications: Scheduled Meds:  amLODipine  10 mg Oral Daily    [START ON 08/29/2018] aspirin EC  325 mg Oral Daily   atorvastatin  80 mg Oral q1800   carvedilol  12.5 mg Oral BID WC   enoxaparin (LOVENOX) injection  40 mg Subcutaneous Q24H   hydrochlorothiazide  25 mg Oral Q breakfast   sodium chloride flush  3 mL Intravenous Q12H   Continuous Infusions:  sodium chloride     PRN Meds: sodium chloride, acetaminophen **OR** acetaminophen, hydrALAZINE, nicotine, sodium chloride flush  Allergies:    Allergies  Allergen Reactions   Bee Venom Anaphylaxis   Shrimp [Shellfish Allergy] Itching    Social History:   Social History   Socioeconomic History   Marital status: Single    Spouse name: Not on file   Number of children: Not on file   Years of education: Not on file   Highest education level: Not on file  Occupational History   Not on file  Social Needs   Financial resource strain: Not on file   Food insecurity:    Worry: Not on file    Inability: Not on file   Transportation needs:    Medical: Not on file    Non-medical: Not on file  Tobacco Use   Smoking status: Current Every Day Smoker    Packs/day: 0.50    Types: Cigarettes   Smokeless tobacco: Current User  Substance and Sexual Activity   Alcohol use: Yes    Comment: occ   Drug use: No   Sexual activity: Not on file  Lifestyle   Physical activity:    Days per week: Not on file    Minutes per session: Not on file   Stress: Not on file  Relationships   Social connections:    Talks on phone: Not on file    Gets together: Not on file    Attends religious service: Not on file    Active member of club or organization: Not on file    Attends meetings of clubs or organizations: Not on file    Relationship status: Not on file   Intimate partner violence:    Fear of current or ex partner: Not on file    Emotionally abused: Not on file    Physically abused: Not on file    Forced sexual activity: Not on file  Other Topics Concern   Not on file    Social History Narrative   Not on file    Family History:   Family History  Problem Relation Age of Onset   Hypertension Father    Family Status:  Family Status  Relation Name Status   Mother  Alive   Father  Alive   MGM  Deceased   MGF  Deceased   PGM  Deceased   PGF  Deceased   ROS:  Please see the history of present illness.  All other ROS reviewed and negative.     Physical Exam/Data:   Vitals:   08/28/18  1610 08/28/18 0615 08/28/18 0653 08/28/18 0657  BP:    (!) 180/125  Pulse: 85 87  78  Resp: 15 18    Temp:    99.2 F (37.3 C)  TempSrc:    Oral  SpO2: 98% 96%  99%  Weight:   (!) 137.5 kg   Height:    (1.778 m)     Intake/Output Summary (Last 24 hours) at 08/28/2018 0930 Last data filed at 08/28/2018 0315 Gross per 24 hour  Intake 3 ml  Output --  Net 3 ml   Filed Weights   08/28/18 0309 08/28/18 0653  Weight: 135.2 kg (!) 137.5 kg   Body mass index is 43.49 kg/m.    General:  Well nourished, well developed, in no acute distress HEENT: normal Lymph: no adenopathy Neck: no JVD Endocrine:  No thryomegaly Vascular: No carotid bruits; 4/4 extremity pulses 2+, without bruits  Cardiac:  normal S1, S2; RRR; no murmur. +S4.  Lungs:  clear to auscultation bilaterally, no wheezing, rhonchi or rales  Abd: soft, nontender, no hepatomegaly  Ext: no edema Musculoskeletal:  No deformities, BUE and BLE strength normal and equal Skin: warm and dry  Neuro:  CNs 2-12 intact, no focal abnormalities noted Psych:  Normal affect   EKG:  The EKG was personally reviewed and demonstrates: NSR with nonspecific T wave abnormalities and inferior lateral leads, similar to prior tracings from 03/2018  Telemetry:  Telemetry was personally reviewed and demonstrates:  NSR, 80s  Relevant CV Studies:  ECHO: Pending  CATH: None  Laboratory Data:  Chemistry Recent Labs  Lab 08/28/18 0314  NA 140  K 3.5  CL 104  CO2 23  GLUCOSE 98  BUN 18   CREATININE 1.27*  CALCIUM 9.5  GFRNONAA >60  GFRAA >60  ANIONGAP 13    Total Protein  Date Value Ref Range Status  12/03/2017 7.5 6.5 - 8.1 g/dL Final   Albumin  Date Value Ref Range Status  12/03/2017 4.0 3.5 - 5.0 g/dL Final   AST  Date Value Ref Range Status  12/03/2017 28 15 - 41 U/L Final   ALT  Date Value Ref Range Status  12/03/2017 29 0 - 44 U/L Final   Alkaline Phosphatase  Date Value Ref Range Status  12/03/2017 56 38 - 126 U/L Final   Total Bilirubin  Date Value Ref Range Status  12/03/2017 0.4 0.3 - 1.2 mg/dL Final   Hematology Recent Labs  Lab 08/28/18 0314  WBC 10.3  RBC 5.04  HGB 13.7  HCT 44.1  MCV 87.5  MCH 27.2  MCHC 31.1  RDW 15.4  PLT 180   Cardiac Enzymes Recent Labs  Lab 08/28/18 0314  TROPONINI 0.05*   No results for input(s): TROPIPOC in the last 168 hours.  BNP Recent Labs  Lab 08/28/18 0314  BNP 136.4*    DDimer  Recent Labs  Lab 08/28/18 0314  DDIMER 0.29   TSH: No results found for: TSH Lipids:No results found for: CHOL, HDL, LDLCALC, LDLDIRECT, TRIG, CHOLHDL HgbA1c:No results found for: HGBA1C  Radiology/Studies:  Dg Chest 2 View  Result Date: 08/28/2018 CLINICAL DATA:  Chest pain EXAM: CHEST - 2 VIEW COMPARISON:  03/09/2018 FINDINGS: Cardiomegaly with vascular congestion. No confluent opacities, effusions or edema. No acute bony abnormality. IMPRESSION: Cardiomegaly, vascular congestion. Electronically Signed   By: Charlett Nose M.D.   On: 08/28/2018 03:43   Assessment and Plan:   1.  Hypertensive emergency: -Dale Mcintosh presented to [facility] with midsternal,  nonradiating chest pain that began approximately 3 AM this morning. Dale Mcintosh reports this pain woke him from his sleep and lasted several minutes and then began to ease on its own. Dale Mcintosh has experienced this in the past and was last seen in the emergency department 07/31/2018 for similar symptoms and treated for hypertensive urgency -Presenting BP was 198/133 -EKG with NSR  and evidence of LVH and nonspecific T wave abnormalities in inferioteral leads, similar to prior tracing from 03/2018 and no acute ischemic changes -Troponin mildly elevated at 0.05, 0.05>> flat and likely in the setting of hypertensive emergency -Echocardiogram, pending -Low suspicion for ACS at this point given significant hypertensive emergency on presentation -Was last seen by her service in 2018 during an office follow-up for hypertension.  Dale Mcintosh was placed on amlodipine 10 mg and ARB with plans for close follow-up however the Dale Mcintosh never returned and has been lost to follow-up since this time -Reports frequent ED evaluations for HTN and prior to 07/2018 Dale Mcintosh has not been taking any antihypertensives given that Dale Mcintosh has no access to PCP for medication refills -Continue amlodipine 10 mg, hydrochlorothiazide 25 mg, carvedilol 12.5 mg, high intensity atorvastatin -Reduce ASA to 81 mg daily -Will increase carvedilol to 25 mg twice daily -Consider increasing hydrochlorothiazide to 50 mg -Will likely need ARB/ACE I once creatinine stabilizes -BP continues to be elevated at 175/125>180/125  2.  Elevated troponin with c/o chest pain: -Troponin on presentation 0.05, 0.05 likely in the setting of markedly elevated blood pressure 198/133 -No recurrent chest discomfort since hospital admission -EKG with no acute ischemic changes however evidence of LVH and nonspecific T wave abnormalities -Could consider further work-up with stress test once BP stabilized if recurrent chest pain, ischemic EKG changes or elevated troponin  3.  AKI: -Creatinine, 1.27 today with a baseline in the 1.1-1.3 range -Continue to monitor renal function closely  -Avoid nephrotoxic medications at this time    For questions or updates, please contact [facility] Please consult [email or web address] for contact info under Cardiology/STEMI.   SignedGeorgie Chard NP-C [facility] Pager: [phone] 08/28/2018 9:30 AM  Dale Mcintosh  seen with NP, agree with the above note.   Dale Mcintosh has had HTN for a number of years, inadequately treated due to poor followup and lack of insurance.  Recently, Dale Mcintosh had been taking amlodipine 10 mg daily and HCTZ 25 mg daily.    Dale Mcintosh has been having chest pain on and off for 3 wks, no particular trigger.  Last night, Dale Mcintosh developed 10/10 central substernal CP at rest.  The pain persisted so Dale Mcintosh came to the ER => BP 198/138.  In the ER, the pain gradually resolved with no particular treatment.  TnI was 0.05 and repeat 0.05.  CXR with vascular congestion.  ECG with NSR, LVH/repolarization abnormality.   Currently, Dale Mcintosh is CP-free.  No dyspnea.  Feels fine. BP still 170s/120s.   No JVD, no edema, +S4.  Clear lungs.   1. Hypertensive emergency: Hypertensive emergency with chest pain and mild elevation in troponin.  Dale Mcintosh has had long-standing, inadequately treated HTN. Currently CP-free but BP remains high.  - Continue amlodipine 10 mg daily.  - Continue HCTZ 25 mg daily.  - Have started Coreg and titrated to 25 mg bid for next dose (as Dale Mcintosh tolerated initiation at 12.5 mg).  - Will likely need addition of ACEI/ARB next.  - Will get echo.  - Dale Mcintosh has been diagnosed with OSA but cannot afford CPAP and has no insurance.  Ideally would  find a way to treat his sleep apnea as this worsens HTN.  2. Renal: Suspect creatinine is at his baseline (1.27 today).  At risk over time for worsening renal function with uncontrolled hypertension.  3. Chest pain/elevated troponin: Slight troponin elevation with no trend.  ECG unchanged, LVH with repolarization abnormality.  Suspect demand ischemia in setting of marked afterload elevation.  Now CP-free.  - Echo as above. - Creatinine is likely at his baseline, will get coronary CTA to assess for evidence for obstructive CAD.  Will beta block him to get HR close to 60.  - Continue ASA 81, statin for now.   Marca Ancona 08/28/2018 11:21 AM

## 2018-08-28 NOTE — ED Notes (Signed)
ED TO INPATIENT HANDOFF REPORT  ED Nurse Name and Phone #:   Marquita PalmsMario  [phone]  S Name/Age/Gender Dale Mcintosh 33 y.o. male Room/Bed: 034C/034C  Code Status   Code Status: Not on file  Home/SNF/Other Home Patient oriented to: self, place, time and situation Is this baseline? Yes   Triage Complete: Triage complete  Chief Complaint chest pain  Triage Note Pt reports waking up feeling like "someone was kicking my chest." States the last time he felt this way he was told that he had "a heart attack", though states he was never admitted.    Allergies Allergies  Allergen Reactions  . Bee Venom Anaphylaxis  . Shrimp [Shellfish Allergy] Itching    Level of Care/Admitting Diagnosis ED Disposition    ED Disposition Condition Comment   Admit  [facility] [[id number]]  Level of Care: Telemetry Cardiac [103]  I expect the patient will be discharged within 24 hours: No (not a candidate for 5C-Observation unit)  Covid Evaluation: N/A  Diagnosis: Chest pain [295621][[id number]]  Admitting Physician: Pearson GrippeKIM, JAMES [[id number]]  Attending Physician: Pearson GrippeKIM, JAMES [[id number]]  PT Class (Do Not Modify): Observation [104]  PT Acc Code (Do Not Modify): Observation [10022]       B Medical/Surgery History Past Medical History:  Diagnosis Date  . Hypertension   . OSA (obstructive sleep apnea)    History reviewed. No pertinent surgical history.   A IV Location/Drains/Wounds Patient Lines/Drains/Airways Status   Active Line/Drains/Airways    Name:   Placement date:   Placement time:   Site:   Days:   Peripheral IV 08/28/18 Left Antecubital   08/28/18    0313    Antecubital   less than 1          Intake/Output Last 24 hours  Intake/Output Summary (Last 24 hours) at 08/28/2018 0615 Last data filed at 08/28/2018 0315 Gross per 24 hour  Intake 3 ml  Output -  Net 3 ml    Labs/Imaging Results for orders placed or performed during the hospital encounter of 08/28/18 (from  the past 48 hour(s))  Basic metabolic panel     Status: Abnormal   Collection Time: 08/28/18  3:14 AM  Result Value Ref Range   Sodium 140 135 - 145 mmol/L   Potassium 3.5 3.5 - 5.1 mmol/L   Chloride 104 98 - 111 mmol/L   CO2 23 22 - 32 mmol/L   Glucose, Bld 98 70 - 99 mg/dL   BUN 18 6 - 20 mg/dL   Creatinine, Ser 3.081.27 (H) 0.61 - 1.24 mg/dL   Calcium 9.5 8.9 - 65.710.3 mg/dL   GFR calc non Af Amer >60 >60 mL/min   GFR calc Af Amer >60 >60 mL/min   Anion gap 13 5 - 15    Comment: Performed at [facility], [address] [place], [place] [address]  CBC     Status: None   Collection Time: 08/28/18  3:14 AM  Result Value Ref Range   WBC 10.3 4.0 - 10.5 K/uL   RBC 5.04 4.22 - 5.81 MIL/uL   Hemoglobin 13.7 13.0 - 17.0 g/dL   HCT 29.544.1 28.439.0 - 13.252.0 %   MCV 87.5 80.0 - 100.0 fL   MCH 27.2 26.0 - 34.0 pg   MCHC 31.1 30.0 - 36.0 g/dL   RDW 44.015.4 10.211.5 - 72.515.5 %   Platelets 180 150 - 400 K/uL   nRBC 0.0 0.0 - 0.2 %    Comment:  Performed at [facility], [address] [place], [place] [address]  Troponin I - ONCE - STAT     Status: Abnormal   Collection Time: 08/28/18  3:14 AM  Result Value Ref Range   Troponin I 0.05 (HH) <0.03 ng/mL    Comment: CRITICAL RESULT CALLED TO, READ BACK BY AND VERIFIED WITH: Wiley Magan M,RN 08/28/18 0507 WAYK Performed at [facility], [address], [place], [place] [address]   D-dimer, quantitative (not at [facility])     Status: None   Collection Time: 08/28/18  3:14 AM  Result Value Ref Range   D-Dimer, Quant 0.29 0.00 - 0.50 ug/mL-FEU    Comment: (NOTE) At the manufacturer cut-off of 0.50 ug/mL FEU, this assay has been documented to exclude PE with a sensitivity and negative predictive value of 97 to 99%.  At this time, this assay has not been approved by the FDA to exclude DVT/VTE. Results should be correlated with clinical presentation. Performed at [facility], [address] [place], [place] [address]   Brain natriuretic  peptide     Status: Abnormal   Collection Time: 08/28/18  3:14 AM  Result Value Ref Range   B Natriuretic Peptide 136.4 (H) 0.0 - 100.0 pg/mL    Comment: Performed at [facility], [address], [place], [place] [address]   Dg Chest 2 View  Result Date: 08/28/2018 CLINICAL DATA:  Chest pain EXAM: CHEST - 2 VIEW COMPARISON:  03/09/2018 FINDINGS: Cardiomegaly with vascular congestion. No confluent opacities, effusions or edema. No acute bony abnormality. IMPRESSION: Cardiomegaly, vascular congestion. Electronically Signed   By: Charlett Nose M.D.   On: 08/28/2018 03:43    Pending Labs [employer] (From admission, onward)    Start     Ordered   Signed and Held  HIV antibody (Routine Testing)  Once,   R     Signed and Held   Signed and Held  Creatinine, serum  (enoxaparin (LOVENOX)    CrCl >/= 30 ml/min)  Weekly,   R    Comments:  while on enoxaparin therapy    Signed and Held   Signed and Held  Comprehensive metabolic panel  Tomorrow morning,   R     Signed and Held   Signed and Held  CBC  Tomorrow morning,   R     Signed and Held   Signed and Held  Troponin I - Now Then Q6H  Now then every 6 hours,   R     Signed and Held   Signed and Held  Lipid panel  Add-on,   R     Signed and Held   Signed and Held  CK total and CKMB (cardiac)not at [facility]  Once,   R     Signed and Held   Signed and Held  Lipid panel  Add-on,   R     Signed and Held          Vitals/Pain Today's Vitals   08/28/18 0515 08/28/18 0530 08/28/18 0537 08/28/18 0545  BP:   (!) 198/138   Pulse: 89 97 89 85  Resp: (!) 28 13 (!) 24 15  Temp:      TempSrc:      SpO2: 97% 100% 96% 98%  Weight:      Height:      PainSc:        Isolation Precautions No active isolations  Medications Medications  carvedilol (COREG) tablet 12.5  mg (has no administration in time range)  hydrALAZINE (APRESOLINE) injection 10 mg (has no administration in time range)  nicotine (NICODERM CQ - dosed in mg/24 hours) patch  21 mg (has no administration in time range)  sodium chloride flush (NS) 0.9 % injection 3 mL (3 mLs Intravenous Given 08/28/18 0315)  aspirin chewable tablet 324 mg (324 mg Oral Given 08/28/18 0538)  labetalol (NORMODYNE) injection 20 mg (20 mg Intravenous Given 08/28/18 0540)    Mobility walks Low fall risk   Focused Assessments Cardiac Assessment Handoff:  Cardiac Rhythm: Normal sinus rhythm Lab Results  Component Value Date   TROPONINI 0.05 (HH) 08/28/2018   Lab Results  Component Value Date   DDIMER 0.29 08/28/2018   Does the Patient currently have chest pain? No     R Recommendations: See Admitting Provider Note  Report given to:   Additional Notes:   Pt not happy about being admitted. Poor compliance with BP meds and f/u. Denies continued CP, only mild soreness with palpation.

## 2018-08-29 ENCOUNTER — Telehealth: Payer: Self-pay | Admitting: Cardiology

## 2018-08-29 DIAGNOSIS — I5020 Unspecified systolic (congestive) heart failure: Secondary | ICD-10-CM

## 2018-08-29 LAB — COMPREHENSIVE METABOLIC PANEL
ALT: 23 U/L (ref 0–44)
AST: 19 U/L (ref 15–41)
Albumin: 3.7 g/dL (ref 3.5–5.0)
Alkaline Phosphatase: 53 U/L (ref 38–126)
Anion gap: 11 (ref 5–15)
BUN: 18 mg/dL (ref 6–20)
CO2: 26 mmol/L (ref 22–32)
Calcium: 9.2 mg/dL (ref 8.9–10.3)
Chloride: 102 mmol/L (ref 98–111)
Creatinine, Ser: 1.35 mg/dL — ABNORMAL HIGH (ref 0.61–1.24)
GFR calc Af Amer: >60 mL/min (ref >60)
GFR calc non Af Amer: >60 mL/min (ref >60)
Glucose, Bld: 97 mg/dL (ref 70–99)
Potassium: 3.7 mmol/L (ref 3.5–5.1)
Sodium: 139 mmol/L (ref 135–145)
Total Bilirubin: 0.5 mg/dL (ref 0.3–1.2)
Total Protein: 7 g/dL (ref 6.5–8.1)

## 2018-08-29 LAB — CBC
HCT: 42.9 % (ref 39.0–52.0)
Hemoglobin: 13.4 g/dL (ref 13.0–17.0)
MCH: 27.2 pg (ref 26.0–34.0)
MCHC: 31.2 g/dL (ref 30.0–36.0)
MCV: 87 fL (ref 80.0–100.0)
Platelets: 172 10*3/uL (ref 150–400)
RBC: 4.93 MIL/uL (ref 4.22–5.81)
RDW: 15.6 % — ABNORMAL HIGH (ref 11.5–15.5)
WBC: 9.3 10*3/uL (ref 4.0–10.5)
nRBC: 0 % (ref 0.0–0.2)

## 2018-08-29 MED ORDER — CARVEDILOL 25 MG PO TABS: 25.0000 mg | ORAL_TABLET | Freq: Two times a day (BID) | ORAL | 0 refills | Status: DC

## 2018-08-29 MED ORDER — NITROGLYCERIN 0.4 MG SL SUBL: 0.4000 mg | SUBLINGUAL_TABLET | SUBLINGUAL | 0 refills | Status: DC | PRN

## 2018-08-29 MED ORDER — LOSARTAN POTASSIUM 50 MG PO TABS: 50.0000 mg | ORAL_TABLET | Freq: Every day | ORAL | 0 refills | Status: DC

## 2018-08-29 NOTE — Progress Notes (Signed)
Progress Note  Patient Name: Dale Mcintosh Date of Encounter: 08/29/2018  Primary Cardiologist: Hilty  Subjective   Blood pressures have been better controlled since admission. Currently he feels well without chest pain or shortness of breath.  Echocardiogram performed yesterday shows a decrease in his systolic function.  Inpatient Medications    Scheduled Meds:  amLODipine  10 mg Oral Daily   aspirin EC  325 mg Oral Daily   carvedilol  25 mg Oral BID WC   diltiazem  10 mg Intravenous Once   enoxaparin (LOVENOX) injection  40 mg Subcutaneous Q24H   hydrochlorothiazide  25 mg Oral Q breakfast   ivabradine  5 mg Oral BID WC   nitroGLYCERIN       sodium chloride flush  3 mL Intravenous Q12H   Continuous Infusions:  sodium chloride     PRN Meds: sodium chloride, acetaminophen **OR** acetaminophen, hydrALAZINE, metoprolol tartrate, nicotine, nitroGLYCERIN, ondansetron (ZOFRAN) IV, sodium chloride flush   Vital Signs    Vitals:   08/28/18 2034 08/28/18 2102 08/28/18 2154 08/29/18 0516  BP: 112/61 134/84 116/69 (!) 146/100  Pulse:   65 67  Resp:      Temp:   98 F (36.7 C) 98 F (36.7 C)  TempSrc:   Oral Oral  SpO2:   100% 96%  Weight:    (!) 136.4 kg  Height:        Intake/Output Summary (Last 24 hours) at 08/29/2018 0819 Last data filed at 08/28/2018 2155 Gross per 24 hour  Intake 731.73 ml  Output 100 ml  Net 631.73 ml   Last 3 Weights 08/29/2018 08/28/2018 08/28/2018  Weight (lbs) 300 lb 9.6 oz 303 lb 2.1 oz 298 lb  Weight (kg) 136.351 kg 137.5 kg 135.172 kg      Telemetry    Sinus rhythm with sinus tachycardia- Personally Reviewed  ECG    Sinus rhythm, rate 98- Personally Reviewed  Physical Exam   GEN: No acute distress.   Neck: No JVD Cardiac: RRR, no murmurs, rubs, or gallops.  Respiratory: Clear to auscultation bilaterally. GI: Soft, nontender, non-distended  MS: No edema; No deformity. Neuro:  Nonfocal  Psych: Normal affect   Labs     Chemistry Recent Labs  Lab 08/28/18 0314 08/29/18 0351  NA 140 139  K 3.5 3.7  CL 104 102  CO2 23 26  GLUCOSE 98 97  BUN 18 18  CREATININE 1.27* 1.35*  CALCIUM 9.5 9.2  PROT  --  7.0  ALBUMIN  --  3.7  AST  --  19  ALT  --  23  ALKPHOS  --  53  BILITOT  --  0.5  GFRNONAA >60 >60  GFRAA >60 >60  ANIONGAP 13 11     Hematology Recent Labs  Lab 08/28/18 0314 08/29/18 0351  WBC 10.3 9.3  RBC 5.04 4.93  HGB 13.7 13.4  HCT 44.1 42.9  MCV 87.5 87.0  MCH 27.2 27.2  MCHC 31.1 31.2  RDW 15.4 15.6*  PLT 180 172    Cardiac Enzymes Recent Labs  Lab 08/28/18 0314 08/28/18 0809 08/28/18 1418 08/28/18 2030  TROPONINI 0.05* 0.05* 0.04* 0.07*   No results for input(s): TROPIPOC in the last 168 hours.   BNP Recent Labs  Lab 08/28/18 0314  BNP 136.4*     DDimer  Recent Labs  Lab 08/28/18 0314  DDIMER 0.29     Radiology    Dg Chest 2 View  Result Date: 08/28/2018 CLINICAL DATA:  Chest pain EXAM: CHEST - 2 VIEW COMPARISON:  03/09/2018 FINDINGS: Cardiomegaly with vascular congestion. No confluent opacities, effusions or edema. No acute bony abnormality. IMPRESSION: Cardiomegaly, vascular congestion. Electronically Signed   By: Charlett Nose M.D.   On: 08/28/2018 03:43   Ct Coronary Morph W/cta Cor W/score W/ca W/cm &/or Wo/cm  Addendum Date: 08/29/2018   ADDENDUM REPORT: 08/29/2018 07:21 CLINICAL DATA:  Chest pain EXAM: Cardiac CTA MEDICATIONS: Sub lingual nitro. 4mg  x 2 and ivabradine 7.5 mg po TECHNIQUE: The patient was scanned on a Siemens 192 slice scanner. Gantry rotation speed was 250 msecs. Collimation was 0.6 mm. A 100 kV prospective scan was triggered in the ascending thoracic aorta at 35-75% of the R-R interval. Average HR during the scan was 60 bpm. The 3D data set was interpreted on a dedicated work station using MPR, MIP and VRT modes. A total of 80cc of contrast was used. FINDINGS: Non-cardiac: See separate report from [facility]. Pulmonary  veins drain normally to the left atrium. Calcium Score: No plaque or stenosis. Coronary Arteries: Right dominant with no anomalies LM: No plaque or stenosis. LAD system:  No plaque or stenosis. Circumflex system: No plaque or stenosis. RCA system: No plaque or stenosis. IMPRESSION: 1. Coronary artery calcium score 0 Agatston units, suggesting low risk for future cardiac events. 2.  No significant coronary artery disease noted. Dalton Mclean Electronically Signed   By: Marca Ancona M.D.   On: 08/29/2018 07:21   Result Date: 08/29/2018 EXAM: OVER-READ INTERPRETATION  CT CHEST The following report is an over-read performed by radiologist Dr. Charlett Nose of [facility] on 08/28/2018. This over-read does not include interpretation of cardiac or coronary anatomy or pathology. The coronary CTA interpretation by the cardiologist is attached. COMPARISON:  None. FINDINGS: Vascular: Cardiomegaly.  Visualized aorta is normal caliber. Mediastinum/Nodes: No adenopathy in the lower mediastinum or hila. Lungs/Pleura: No confluent opacities or effusions. Upper Abdomen: Imaging into the upper abdomen shows no acute findings. Musculoskeletal: Mild bilateral gynecomastia. No acute bony abnormality. IMPRESSION: No acute or significant extracardiac abnormality. Electronically Signed: By: Charlett Nose M.D. On: 08/28/2018 21:50    Cardiac Studies   TTE 08/28/18  1. The left ventricle has mild-moderately reduced systolic function, with an ejection fraction of 40-45%. The cavity size was normal. There is moderate concentric left ventricular hypertrophy. Left ventricular diastolic Doppler parameters are consistent  with pseudonormalization. Left ventrical global hypokinesis without regional wall motion abnormalities.  2. The right ventricle has normal systolic function. The cavity was normal. There is no increase in right ventricular wall thickness.  3. Left atrial size was mildly dilated.  4. Right atrial size was  mildly dilated.  5. The aortic valve is tricuspid.  Patient Profile     33 y.o. male with a history of hypertension presented to the hospital with hypertensive emergency  Assessment & Plan    1.  Hypertensive emergency: Blood pressure significantly improved today.  He was put on carvedilol 25 mg, HCTZ 25 mg and amlodipine 10 mg yesterday.  Unfortunately his most recent echo shows a decrease in his systolic function to 40 to 45%.  Would thus recommend stopping his amlodipine and starting him on 50 of losartan.  We Halo Laski arrange him follow-up in cardiology clinic.  2.  Systolic heart failure, unknown chronicity: Likely due to hypertension.  Blood pressure better controlled today on current medications.  [facility] Amardeep Beckers sign off.   Medication Recommendations: Carvedilol 25 mg twice daily, HCTZ 25 mg,  losartan 50 mg Other recommendations (labs, testing, etc): None Follow up as an outpatient: To be arranged in cardiology clinic  For questions or updates, please contact [facility] Please consult [email or web address] for contact info under        Signed, Wladyslawa Disbro Jorja Loa, MD  08/29/2018, 8:19 AM

## 2018-08-29 NOTE — Discharge Summary (Signed)
Physician Discharge Summary  Dale Mcintosh WJX:[id number] DOB: [birth year] DOA: 08/28/2018  PCP: Patient, No Pcp Per  Admit date: 08/28/2018 Discharge date: 08/29/2018  Admitted From: Home Disposition: Home  Recommendations for Outpatient Follow-up:  1. Follow up with PCP in 1 week 2. Follow-up with cardiology as an outpatient 3. Follow up in ED if symptoms worsen or new appear   Home Health: No Equipment/Devices: None  Discharge Condition: Stable CODE STATUS: Full Diet recommendation: Heart healthy  Brief/Interim Summary: 33 year old male with history of hypertension presented with chest pain.  He had extremely elevated blood pressure on presentation.  Troponin was only mildly positive and did not trend upwards.  Cardiology evaluated the patient.  Echo showed EF of 40 to 45%.  CTA coronary was normal.  Cardiology has cleared the patient for discharge.  Discharge Diagnoses:  Principal Problem:   Chest pain Active Problems:   Hypertensive emergency  Chest pain -Most likely from hypertensive emergency -Resolved -Cardiology has cleared the patient for discharge  Troponin elevation -Troponins did not trend up.  CTA coronary was normal.  Systolic heart failure, unknown chronicity -Echo showed EF of 40 to 45%.  Currently on Coreg.  Cardiology recommends losartan 50 mg daily.  Cardiology has cleared the patient for discharge with outpatient follow-up with cardiology.  Hypertensive emergency -Blood pressure was extremely elevated on presentation.  Blood pressure is still elevated but improving.  Discharge home on Coreg 25 mg twice a day, hydrochlorothiazide 25 mg daily and losartan 50 mg daily.  Discontinue amlodipine  Morbid obesity -Outpatient follow-up.  Tobacco use  -Patient was counseled regarding smoking cessation during the hospitalization.  Outpatient follow-up  Discharge Instructions  Discharge Instructions    Ambulatory referral to Cardiology   Complete by:  As  directed    Diet - low sodium heart healthy   Complete by:  As directed    Increase activity slowly   Complete by:  As directed      Allergies as of 08/29/2018      Reactions   Bee Venom Anaphylaxis   Shrimp [shellfish Allergy] Itching      Medication List    STOP taking these medications   amLODipine 10 MG tablet Commonly known as:  NORVASC   methocarbamol 500 MG tablet Commonly known as:  ROBAXIN     TAKE these medications   aspirin-acetaminophen-caffeine 250-250-65 MG tablet Commonly known as:  EXCEDRIN MIGRAINE Take 2 tablets by mouth every 6 (six) hours as needed for headache.   carvedilol 25 MG tablet Commonly known as:  COREG Take 1 tablet (25 mg total) by mouth 2 (two) times daily with a meal.   hydrochlorothiazide 25 MG tablet Commonly known as:  HYDRODIURIL Take 1 tablet (25 mg total) by mouth daily with breakfast.   losartan 50 MG tablet Commonly known as:  Cozaar Take 1 tablet (50 mg total) by mouth daily for 30 days.   nitroGLYCERIN 0.4 MG SL tablet Commonly known as:  NITROSTAT Place 1 tablet (0.4 mg total) under the tongue every 5 (five) minutes as needed for chest pain (per CT heart protocol).      Follow-up Information    [facility] Follow up on 09/09/2018.   Why:   @ 2:30 pm with MD Newlin for hospital follow up appointment. If you can not make this scheduled appointment please call to rescheulde as soon as possible. Pt can utilize pharmacy onsite medications range from $4.00-$10.00 mail order only Contact information: [address]  [place] [address] [phone]       Chrystie Nose, MD Follow up.   Specialty:  Cardiology Why:  Office will call you with a follow up tele-health appt within the next 2 days.  Contact information: [address] [place] [address] [phone]          Allergies  Allergen Reactions  . Bee Venom Anaphylaxis  . Shrimp [Shellfish  Allergy] Itching    Consultations:  Cardiology   Procedures/Studies: Dg Chest 2 View  Result Date: 08/28/2018 CLINICAL DATA:  Chest pain EXAM: CHEST - 2 VIEW COMPARISON:  03/09/2018 FINDINGS: Cardiomegaly with vascular congestion. No confluent opacities, effusions or edema. No acute bony abnormality. IMPRESSION: Cardiomegaly, vascular congestion. Electronically Signed   By: Charlett Nose M.D.   On: 08/28/2018 03:43   Ct Coronary Morph W/cta Cor W/score W/ca W/cm &/or Wo/cm  Addendum Date: 08/29/2018   ADDENDUM REPORT: 08/29/2018 07:21 CLINICAL DATA:  Chest pain EXAM: Cardiac CTA MEDICATIONS: Sub lingual nitro. 4mg  x 2 and ivabradine 7.5 mg po TECHNIQUE: The patient was scanned on a Siemens 192 slice scanner. Gantry rotation speed was 250 msecs. Collimation was 0.6 mm. A 100 kV prospective scan was triggered in the ascending thoracic aorta at 35-75% of the R-R interval. Average HR during the scan was 60 bpm. The 3D data set was interpreted on a dedicated work station using MPR, MIP and VRT modes. A total of 80cc of contrast was used. FINDINGS: Non-cardiac: See separate report from [facility]. Pulmonary veins drain normally to the left atrium. Calcium Score: No plaque or stenosis. Coronary Arteries: Right dominant with no anomalies LM: No plaque or stenosis. LAD system:  No plaque or stenosis. Circumflex system: No plaque or stenosis. RCA system: No plaque or stenosis. IMPRESSION: 1. Coronary artery calcium score 0 Agatston units, suggesting low risk for future cardiac events. 2.  No significant coronary artery disease noted. Dalton Mclean Electronically Signed   By: Marca Ancona M.D.   On: 08/29/2018 07:21   Result Date: 08/29/2018 EXAM: OVER-READ INTERPRETATION  CT CHEST The following report is an over-read performed by radiologist Dr. Charlett Nose of [facility] on 08/28/2018. This over-read does not include interpretation of cardiac or coronary anatomy or pathology. The  coronary CTA interpretation by the cardiologist is attached. COMPARISON:  None. FINDINGS: Vascular: Cardiomegaly.  Visualized aorta is normal caliber. Mediastinum/Nodes: No adenopathy in the lower mediastinum or hila. Lungs/Pleura: No confluent opacities or effusions. Upper Abdomen: Imaging into the upper abdomen shows no acute findings. Musculoskeletal: Mild bilateral gynecomastia. No acute bony abnormality. IMPRESSION: No acute or significant extracardiac abnormality. Electronically Signed: By: Charlett Nose M.D. On: 08/28/2018 21:50    Echo on 08/28/2018  1. The left ventricle has mild-moderately reduced systolic function, with an ejection fraction of 40-45%. The cavity size was normal. There is moderate concentric left ventricular hypertrophy. Left ventricular diastolic Doppler parameters are consistent  with pseudonormalization. Left ventrical global hypokinesis without regional wall motion abnormalities.  2. The right ventricle has normal systolic function. The cavity was normal. There is no increase in right ventricular wall thickness.  3. Left atrial size was mildly dilated.  4. Right atrial size was mildly dilated.  5. The aortic valve is tricuspid.   Subjective: Patient seen and examined at bedside.  He feels better.  No chest pain.  No worsening shortness of breath or fever.  Discharge Exam: Vitals:   08/29/18 0516 08/29/18 0830  BP: (!) 146/100 Marland Kitchen)  153/102  Pulse: 67   Resp:    Temp: 98 F (36.7 C)   SpO2: 96%     General: Pt is alert, awake, not in acute distress Cardiovascular: rate controlled, S1/S2 + Respiratory: bilateral decreased breath sounds at bases with some scattered basilar crackles Abdominal: Soft, morbidly obese, NT, ND, bowel sounds + Extremities: Trace edema, no cyanosis    The results of significant diagnostics from this hospitalization (including imaging, microbiology, ancillary and laboratory) are listed below for reference.     Microbiology: No  results found for this or any previous visit (from the past 240 hour(s)).   Labs: BNP (last 3 results) Recent Labs    08/28/18 0314  BNP 136.4*   Basic Metabolic Panel: Recent Labs  Lab 08/28/18 0314 08/29/18 0351  NA 140 139  K 3.5 3.7  CL 104 102  CO2 23 26  GLUCOSE 98 97  BUN 18 18  CREATININE 1.27* 1.35*  CALCIUM 9.5 9.2   Liver Function Tests: Recent Labs  Lab 08/29/18 0351  AST 19  ALT 23  ALKPHOS 53  BILITOT 0.5  PROT 7.0  ALBUMIN 3.7   No results for input(s): LIPASE, AMYLASE in the last 168 hours. No results for input(s): AMMONIA in the last 168 hours. CBC: Recent Labs  Lab 08/28/18 0314 08/29/18 0351  WBC 10.3 9.3  HGB 13.7 13.4  HCT 44.1 42.9  MCV 87.5 87.0  PLT 180 172   Cardiac Enzymes: Recent Labs  Lab 08/28/18 0314 08/28/18 0809 08/28/18 1418 08/28/18 2030  CKTOTAL  --  276  --   --   CKMB  --  2.9  --   --   TROPONINI 0.05* 0.05* 0.04* 0.07*   BNP: Invalid input(s): POCBNP CBG: No results for input(s): GLUCAP in the last 168 hours. D-Dimer Recent Labs    08/28/18 0314  DDIMER 0.29   Hgb A1c No results for input(s): HGBA1C in the last 72 hours. Lipid Profile Recent Labs    08/28/18 0809  CHOL 169  HDL 43  LDLCALC 109*  TRIG 85  CHOLHDL 3.9   Thyroid function studies No results for input(s): TSH, T4TOTAL, T3FREE, THYROIDAB in the last 72 hours.  Invalid input(s): FREET3 Anemia work up No results for input(s): VITAMINB12, FOLATE, FERRITIN, TIBC, IRON, RETICCTPCT in the last 72 hours. Urinalysis    Component Value Date/Time   COLORURINE YELLOW 12/03/2017 1205   APPEARANCEUR CLEAR 12/03/2017 1205   LABSPEC 1.029 12/03/2017 1205   PHURINE 5.0 12/03/2017 1205   GLUCOSEU NEGATIVE 12/03/2017 1205   HGBUR NEGATIVE 12/03/2017 1205   BILIRUBINUR NEGATIVE 12/03/2017 1205   KETONESUR 5 (A) 12/03/2017 1205   PROTEINUR NEGATIVE 12/03/2017 1205   UROBILINOGEN 0.2 12/02/2017 1504   NITRITE NEGATIVE 12/03/2017 1205    LEUKOCYTESUR NEGATIVE 12/03/2017 1205   Sepsis Labs Invalid input(s): PROCALCITONIN,  WBC,  LACTICIDVEN Microbiology No results found for this or any previous visit (from the past 240 hour(s)).   Time coordinating discharge: 35 minutes  SIGNED:   Glade Lloyd, MD  [facility] 08/29/2018, 9:09 AM

## 2018-08-29 NOTE — Telephone Encounter (Signed)
   TELEPHONE CALL NOTE  This patient has been deemed a candidate for follow-up tele-health visit to limit community exposure during the Covid-19 pandemic. I spoke with the patient via phone to discuss instructions. This has been outlined on the patient's AVS (dotphrase: hcevisitinfo). The patient was advised to review the section on consent for treatment as well. The patient will receive a phone call 2-3 days prior to their E-Visit at which time consent will be verbally confirmed. A Virtual Office Visit appointment type has been scheduled for TO BE DETERMINED as patient was discharged over the weekend (message sent to scheduling pool), with "VIDEO" or "TELEPHONE" in the appointment notes - patient prefers VIDEO type. Has an iphone.   I have either confirmed the patient is active in MyChart or offered to send sign-up link to phone/email via Mychart icon beside patient's photo.  Laverda Page, NP 08/29/2018 8:43 AM

## 2018-09-09 ENCOUNTER — Inpatient Hospital Stay: Payer: Self-pay | Admitting: Family Medicine

## 2018-09-11 ENCOUNTER — Telehealth: Payer: Self-pay | Admitting: Internal Medicine

## 2018-09-15 ENCOUNTER — Telehealth: Payer: Self-pay | Admitting: Internal Medicine

## 2018-09-17 ENCOUNTER — Telehealth: Payer: Self-pay | Admitting: Cardiology

## 2018-09-17 NOTE — Telephone Encounter (Signed)
Called patient and LVM to call back and schedule hospital followup.

## 2018-09-21 NOTE — Progress Notes (Signed)
Virtual Visit via Video Note   This visit type was conducted due to national recommendations for restrictions regarding the COVID-19 Pandemic (e.g. social distancing) in an effort to limit this patient's exposure and mitigate transmission in our community.  Due to his co-morbid illnesses, this patient is at least at moderate risk for complications without adequate follow up.  This format is felt to be most appropriate for this patient at this time.  All issues noted in this document were discussed and addressed.  A limited physical exam was performed with this format.  Please refer to the patient's chart for his consent to telehealth for [facility].   Date:  09/22/2018   ID:  Dale Mcintosh, DOB 1985/08/09, MRN [id number]  Patient Location: Home Provider Location: Home  PCP:  Patient, No Pcp Per  Cardiologist:  No primary care provider on file.  Electrophysiologist:  None   Evaluation Performed:  Follow-Up Visit  Chief Complaint:  Chest pain   History of Present Illness:    Dale Mcintosh is a 33 y.o. male for follow up of a recent hospitalization for HF and hypertensive urgency.  He was admitted with chest pain and was very hypertensive on admission.  He did have elevated troponin which was felt to be secondary to HTN.  Coronaries were normal on CTA.  His EF was reduced at 40 - 45%.   Since discharge he has felt okay.  However, is about to run out of his medications.  He does not yet have a blood pressure cuff so he is going to the store right now to get 1.  He denies any chest pressure, neck or arm discomfort.  He has had no palpitations, presyncope or syncope.  He has no weight gain or edema.  He had a history of syncope with a question of a prolonged QT.  He was ultimately not thought to have long QT syndrome.  He has had difficult to control HTN.  He was last seen in 2018.      The patient does not have symptoms concerning for COVID-19 infection (fever, chills, cough, or new  shortness of breath).    Past Medical History:  Diagnosis Date  . Hypertension   . OSA (obstructive sleep apnea)    Past Surgical History:  Procedure Laterality Date  . NO PAST SURGERIES       Prior to Admission medications   Medication Sig Start Date End Date Taking? Authorizing Provider  aspirin-acetaminophen-caffeine (EXCEDRIN MIGRAINE) [phone] MG tablet Take 2 tablets by mouth every 6 (six) hours as needed for headache.   Yes [provider]  carvedilol (COREG) 25 MG tablet Take 1 tablet (25 mg total) by mouth 2 (two) times daily with a meal. 08/29/18  Yes Glade Lloyd, MD  hydrochlorothiazide (HYDRODIURIL) 25 MG tablet Take 1 tablet (25 mg total) by mouth daily with breakfast. 07/31/18  Yes Dayton Scrape, Alyssa B, PA-C  losartan (COZAAR) 50 MG tablet Take 1 tablet (50 mg total) by mouth daily for 30 days. 08/29/18 09/28/18 Yes Glade Lloyd, MD  nitroGLYCERIN (NITROSTAT) 0.4 MG SL tablet Place 1 tablet (0.4 mg total) under the tongue every 5 (five) minutes as needed for chest pain (per CT heart protocol). 08/29/18  Yes Glade Lloyd, MD     Allergies:   Bee venom and Shrimp [shellfish allergy]   Social History   Tobacco Use  . Smoking status: Current Every Day Smoker    Packs/day: 0.50    Types: Cigarettes  . Smokeless  tobacco: Current User  Substance Use Topics  . Alcohol use: Yes    Comment: occ  . Drug use: No     Family Hx: The patient's family history includes Hypertension in his father.  ROS:   Please see the history of present illness.    As stated in the HPI and negative for all other systems.    Prior CV studies:   The following studies were reviewed today:  Hospital records  Labs/Other Tests and Data Reviewed:    EKG:  NSR, rate 98, LVH with repolarization changes. 08/28/18  Recent Labs: 08/28/2018: B Natriuretic Peptide 136.4 08/29/2018: ALT 23; BUN 18; Creatinine, Ser 1.35; Hemoglobin 13.4; Platelets 172; Potassium 3.7; Sodium 139    Recent Lipid Panel Lab Results  Component Value Date/Time   CHOL 169 08/28/2018 08:09 AM   TRIG 85 08/28/2018 08:09 AM   HDL 43 08/28/2018 08:09 AM   CHOLHDL 3.9 08/28/2018 08:09 AM   LDLCALC 109 (H) 08/28/2018 08:09 AM    Wt Readings from Last 3 Encounters:  09/22/18 298 lb (135.2 kg)  08/29/18 (!) 300 lb 9.6 oz (136.4 kg)  07/31/18 300 lb (136.1 kg)     Objective:    Vital Signs:  Ht 5' 10.5" (1.791 m)   Wt 298 lb (135.2 kg)   BMI 42.15 kg/m    VITAL SIGNS:  reviewed GEN:  no acute distress EYES:  sclerae anicteric, EOMI - Extraocular Movements Intact RESPIRATORY:  normal respiratory effort, symmetric expansion NEURO:  alert and oriented x 3, no obvious focal deficit PSYCH:  normal affect  ASSESSMENT & PLAN:    Malignant HTN:    He is going to get a blood pressure cuff.  He understands the importance of managing his blood pressure.  I am not going to make a change to his meds until I get some further readings but I am definitely going to want to uptitrate.  We will renew his medications.  Obesity:  We talked about diet and exercise.    Sleep apnea:    He cannot afford to have this treated.  He needs to look into assistance    Cardiomyopathy:  His EF is reduced and we will manage this in the context of treating his HF.      COVID-19 Education: The signs and symptoms of COVID-19 were discussed with the patient and how to seek care for testing (follow up with PCP or arrange E-visit).  The importance of social distancing was discussed today.  Time:   Today, I have spent 30 minutes with the patient with telehealth technology discussing the above problems.     Medication Adjustments/Labs and Tests Ordered: Current medicines are reviewed at length with the patient today.  Concerns regarding medicines are outlined above.   Tests Ordered: No orders of the defined types were placed in this encounter.   Medication Changes: No orders of the defined types were placed in  this encounter.   Disposition:  Follow up two weeks virtual visit.   Signed, Rollene Rotunda, MD  09/22/2018 2:23 PM    [facility]

## 2018-09-22 ENCOUNTER — Telehealth (INDEPENDENT_AMBULATORY_CARE_PROVIDER_SITE_OTHER): Payer: Self-pay | Admitting: Cardiology

## 2018-09-22 ENCOUNTER — Encounter: Payer: Self-pay | Admitting: Cardiology

## 2018-09-22 VITALS — Ht 70.5 in | Wt 298.0 lb

## 2018-09-22 DIAGNOSIS — I5021 Acute systolic (congestive) heart failure: Secondary | ICD-10-CM

## 2018-09-22 DIAGNOSIS — Z7189 Other specified counseling: Secondary | ICD-10-CM

## 2018-09-22 DIAGNOSIS — I160 Hypertensive urgency: Secondary | ICD-10-CM

## 2018-09-22 MED ORDER — LOSARTAN POTASSIUM 50 MG PO TABS: 50.0000 mg | ORAL_TABLET | Freq: Every day | ORAL | 3 refills | Status: DC

## 2018-09-22 MED ORDER — CARVEDILOL 25 MG PO TABS: 25.0000 mg | ORAL_TABLET | Freq: Two times a day (BID) | ORAL | 3 refills | Status: DC

## 2018-09-22 NOTE — Patient Instructions (Addendum)
Medication Instructions:  Continue current medications  If you need a refill on your cardiac medications before your next appointment, please call your pharmacy.  Labwork: None ordered   Testing/Procedures: None Ordered  Follow-Up: . Your physician recommends that you schedule a follow-up appointment in: 1 Month with Corine Shelter 24th June   At [facility], you and your health needs are our priority.  As part of our continuing mission to provide you with exceptional heart care, we have created designated Provider Care Teams.  These Care Teams include your primary Cardiologist (physician) and Advanced Practice Providers (APPs -  Physician Assistants and Nurse Practitioners) who all work together to provide you with the care you need, when you need it.  Thank you for choosing [facility] at [facility]!!

## 2018-09-23 DIAGNOSIS — Z7189 Other specified counseling: Secondary | ICD-10-CM | POA: Insufficient documentation

## 2018-09-23 DIAGNOSIS — I5021 Acute systolic (congestive) heart failure: Secondary | ICD-10-CM | POA: Insufficient documentation

## 2018-09-23 DIAGNOSIS — I160 Hypertensive urgency: Secondary | ICD-10-CM | POA: Insufficient documentation

## 2018-09-30 ENCOUNTER — Encounter (HOSPITAL_COMMUNITY): Payer: Self-pay | Admitting: Emergency Medicine

## 2018-09-30 ENCOUNTER — Other Ambulatory Visit: Payer: Self-pay

## 2018-09-30 ENCOUNTER — Emergency Department (HOSPITAL_COMMUNITY)
Admission: EM | Admit: 2018-09-30 | Discharge: 2018-09-30 | Disposition: A | Discharge status: Home / Self Care | Payer: Self-pay | Attending: Emergency Medicine | Admitting: Emergency Medicine

## 2018-09-30 DIAGNOSIS — I5020 Unspecified systolic (congestive) heart failure: Secondary | ICD-10-CM | POA: Insufficient documentation

## 2018-09-30 DIAGNOSIS — I110 Hypertensive heart disease with heart failure: Secondary | ICD-10-CM | POA: Insufficient documentation

## 2018-09-30 DIAGNOSIS — R310 Gross hematuria: Secondary | ICD-10-CM | POA: Insufficient documentation

## 2018-09-30 DIAGNOSIS — Z79899 Other long term (current) drug therapy: Secondary | ICD-10-CM | POA: Insufficient documentation

## 2018-09-30 DIAGNOSIS — F17210 Nicotine dependence, cigarettes, uncomplicated: Secondary | ICD-10-CM | POA: Insufficient documentation

## 2018-09-30 DIAGNOSIS — F17228 Nicotine dependence, chewing tobacco, with other nicotine-induced disorders: Secondary | ICD-10-CM | POA: Insufficient documentation

## 2018-09-30 LAB — URINALYSIS, ROUTINE W REFLEX MICROSCOPIC
Bilirubin Urine: NEGATIVE
Glucose, UA: NEGATIVE mg/dL
Hgb urine dipstick: NEGATIVE
Ketones, ur: NEGATIVE mg/dL
Leukocytes,Ua: NEGATIVE
Nitrite: NEGATIVE
Protein, ur: NEGATIVE mg/dL
Specific Gravity, Urine: 1.017 (ref 1.005–1.030)
pH: 6 (ref 5.0–8.0)

## 2018-09-30 LAB — I-STAT CHEM 8, ED
BUN: 18 mg/dL (ref 6–20)
Calcium, Ion: 1.11 mmol/L — ABNORMAL LOW (ref 1.15–1.40)
Chloride: 106 mmol/L (ref 98–111)
Creatinine, Ser: 1.3 mg/dL — ABNORMAL HIGH (ref 0.61–1.24)
Glucose, Bld: 119 mg/dL — ABNORMAL HIGH (ref 70–99)
HCT: 44 % (ref 39.0–52.0)
Hemoglobin: 15 g/dL (ref 13.0–17.0)
Potassium: 3.8 mmol/L (ref 3.5–5.1)
Sodium: 142 mmol/L (ref 135–145)
TCO2: 28 mmol/L (ref 22–32)

## 2018-09-30 NOTE — ED Triage Notes (Signed)
Pt in w/complaints of hematuria since waking this am. States this happened 2 mo's ago as well. Denies any pain or urinary symptoms

## 2018-09-30 NOTE — ED Notes (Signed)
Patient verbalized understanding of discharge instructions and denies any further needs or questions at this time. VS stable. Patient ambulatory with steady gait.  

## 2018-09-30 NOTE — Discharge Instructions (Signed)
Follow-up with the urologist on this matter.  It is very important that you keep your appointments, including primary care provider.

## 2018-09-30 NOTE — ED Notes (Signed)
ED Provider at bedside. 

## 2018-09-30 NOTE — ED Provider Notes (Signed)
[facility] EMERGENCY DEPARTMENT Provider Note   CSN: [id number] Arrival date & time: 09/30/18  0700    History   Chief Complaint Chief Complaint  Patient presents with  . Hematuria    HPI Dale Mcintosh is a 33 y.o. male.     HPI   Dale Mcintosh is a 33 y.o. male, with a history of hypertension and acute systolic heart failure, presenting to the ED with hematuria this morning.  States with his initial void of the morning he had frank bright red hematuria without clots.  It cleared by the second urination and he has not had any further episodes. This has happened once before without definitive diagnosis. He denies fever/chills, dysuria, urinary frequency, difficulty urinating, abdominal pain, nausea/vomiting, pain with bowel movements, pain/swelling in the testicles/scrotum, penile discharge, flank pain, back pain, chest pain, shortness of breath, dizziness, or any other complaints.     Past Medical History:  Diagnosis Date  . Hypertension   . OSA (obstructive sleep apnea)     Patient Active Problem List   Diagnosis Date Noted  . Educated About Covid-19 Virus Infection 09/23/2018  . Acute systolic HF (heart failure) (HCC) 09/23/2018  . Hypertensive urgency 09/23/2018  . Chest pain 08/28/2018  . Excessive daytime sleepiness 02/01/2017  . Morning headache 02/01/2017  . Morbid obesity (HCC) 02/01/2017  . Snoring 02/01/2017  . Hypertensive emergency 02/01/2017    Past Surgical History:  Procedure Laterality Date  . NO PAST SURGERIES    . None          Home Medications    Prior to Admission medications   Medication Sig Start Date End Date Taking? Authorizing Provider  aspirin-acetaminophen-caffeine (EXCEDRIN MIGRAINE) [phone] MG tablet Take 2 tablets by mouth every 6 (six) hours as needed for headache.    [provider]  carvedilol (COREG) 25 MG tablet Take 1 tablet (25 mg total) by mouth 2 (two) times daily with a meal. 09/22/18    Rollene Rotunda, MD  losartan (COZAAR) 50 MG tablet Take 1 tablet (50 mg total) by mouth daily for 30 days. 09/22/18 10/22/18  Rollene Rotunda, MD  nitroGLYCERIN (NITROSTAT) 0.4 MG SL tablet Place 1 tablet (0.4 mg total) under the tongue every 5 (five) minutes as needed for chest pain (per CT heart protocol). 08/29/18   Glade Lloyd, MD    Family History Family History  Problem Relation Age of Onset  . Hypertension Father     Social History Social History   Tobacco Use  . Smoking status: Current Every Day Smoker    Packs/day: 0.50    Types: Cigarettes  . Smokeless tobacco: Current User  Substance Use Topics  . Alcohol use: Yes    Comment: occ  . Drug use: No     Allergies   Bee venom and Shrimp [shellfish allergy]   Review of Systems Review of Systems  Constitutional: Negative for chills and fever.  Respiratory: Negative for shortness of breath.   Cardiovascular: Negative for chest pain and leg swelling.  Gastrointestinal: Negative for abdominal pain, blood in stool, diarrhea, nausea and vomiting.  Genitourinary: Positive for hematuria. Negative for difficulty urinating, discharge, dysuria, flank pain, frequency, penile pain, penile swelling, scrotal swelling and testicular pain.  Musculoskeletal: Negative for back pain.  Neurological: Negative for weakness.  All other systems reviewed and are negative.    Physical Exam Updated Vital Signs BP (!) 162/118 (BP Location: Right Arm)   Pulse 83   Temp 98.7 F (37.1 C) (  Oral)   Resp 18   Ht 5' 10.5" (1.791 m)   Wt 135 kg   SpO2 99%   BMI 42.10 kg/m   Physical Exam Vitals signs and nursing note reviewed.  Constitutional:      General: He is not in acute distress.    Appearance: He is well-developed. He is not diaphoretic.  HENT:     Head: Normocephalic and atraumatic.     Mouth/Throat:     Mouth: Mucous membranes are moist.     Pharynx: Oropharynx is clear.  Eyes:     Conjunctiva/sclera: Conjunctivae normal.   Neck:     Musculoskeletal: Neck supple.  Cardiovascular:     Rate and Rhythm: Normal rate and regular rhythm.     Pulses: Normal pulses.          Radial pulses are 2+ on the right side and 2+ on the left side.       Posterior tibial pulses are 2+ on the right side and 2+ on the left side.     Comments: Tactile temperature in the extremities appropriate and equal bilaterally. Pulmonary:     Effort: Pulmonary effort is normal. No respiratory distress.  Abdominal:     Palpations: Abdomen is soft.     Tenderness: There is no abdominal tenderness. There is no guarding.  Genitourinary:    Comments: Penis, scrotum, and testicles without swelling, lesions, or tenderness. No penile discharge.  No inguinal hernia noted. Cremasteric reflex intact. No inguinal lymphadenopathy. Overall normal male genitalia. RN, Nehemiah SettleBrooke, served as Biomedical engineerchaperone during the exam. Musculoskeletal:     Right lower leg: No edema.     Left lower leg: No edema.  Lymphadenopathy:     Cervical: No cervical adenopathy.  Skin:    General: Skin is warm and dry.  Neurological:     Mental Status: He is alert.  Psychiatric:        Mood and Affect: Mood and affect normal.        Speech: Speech normal.        Behavior: Behavior normal.      ED Treatments / Results  Labs (all labs ordered are listed, but only abnormal results are displayed) Labs Reviewed  I-STAT CHEM 8, ED - Abnormal; Notable for the following components:      Result Value   Creatinine, Ser 1.30 (*)    Glucose, Bld 119 (*)    Calcium, Ion 1.11 (*)    All other components within normal limits  URINE CULTURE  URINALYSIS, ROUTINE W REFLEX MICROSCOPIC  GC/CHLAMYDIA PROBE AMP ([place]) [facility]   BUN  Date Value Ref Range Status  09/30/2018 18 6 - 20 mg/dL Final  16/10/960404/25/2020 18 6 - 20 mg/dL Final  54/09/811904/24/2020 18 6 - 20 mg/dL Final  14/78/295603/27/2020 16 6 - 20 mg/dL Final   Creatinine, Ser  Date Value Ref Range Status  09/30/2018 1.30 (H) 0.61 - 1.24  mg/dL Final  21/30/865704/25/2020 8.461.35 (H) 0.61 - 1.24 mg/dL Final  96/29/528404/24/2020 1.321.27 (H) 0.61 - 1.24 mg/dL Final  44/01/027203/27/2020 5.361.19 0.61 - 1.24 mg/dL Final     EKG None  Radiology No results found.  Procedures Procedures (including critical care time)  Medications Ordered in ED Medications - No data to display   Initial Impression / Assessment and Plan / ED Course  I have reviewed the triage vital signs and the nursing notes.  Pertinent labs & imaging results that were available during my care of the patient  were reviewed by me and considered in my medical decision making (see chart for details).        Patient complains of a single episode of frank hematuria.  This is since cleared.  No hematuria noted on UA here in the ED.  Creatinine consistent with previous.  Hypertension noted and is in the process of being managed as an outpatient.  Doubt hypertensive emergency.  He has been instructed to follow-up with PCP and urology.  Return precautions discussed.  Patient voices understanding of these instructions, accepts the plan, and is comfortable with discharge.    Final Clinical Impressions(s) / ED Diagnoses   Final diagnoses:  Gross hematuria    ED Discharge Orders    None       Concepcion Living 09/30/18 8270    Shaune Pollack, MD 09/30/18 2026

## 2018-10-01 LAB — URINE CULTURE: Culture: NO GROWTH

## 2018-10-01 LAB — GC/CHLAMYDIA PROBE AMP (~~LOC~~) NOT AT ARMC
Chlamydia: NEGATIVE
Neisseria Gonorrhea: NEGATIVE

## 2018-10-22 ENCOUNTER — Telehealth: Payer: Self-pay

## 2018-10-22 NOTE — Telephone Encounter (Signed)
Left message for patient to contact office need to inform him that I moved his appt back 15 minutes due to the template change as well as ask the covid-19 questions prior to office visit.

## 2018-10-27 ENCOUNTER — Telehealth: Payer: Self-pay | Admitting: Cardiology

## 2018-10-27 NOTE — Telephone Encounter (Signed)
LVM regarding appt on 10-28-18.

## 2018-10-28 ENCOUNTER — Ambulatory Visit: Payer: Self-pay | Admitting: Cardiology

## 2018-11-02 ENCOUNTER — Emergency Department (HOSPITAL_COMMUNITY)
Admission: EM | Admit: 2018-11-02 | Discharge: 2018-11-02 | Disposition: A | Discharge status: Home / Self Care | Payer: Self-pay | Attending: Emergency Medicine | Admitting: Emergency Medicine

## 2018-11-02 ENCOUNTER — Encounter (HOSPITAL_COMMUNITY): Payer: Self-pay | Admitting: Emergency Medicine

## 2018-11-02 ENCOUNTER — Other Ambulatory Visit: Payer: Self-pay

## 2018-11-02 DIAGNOSIS — I509 Heart failure, unspecified: Secondary | ICD-10-CM | POA: Insufficient documentation

## 2018-11-02 DIAGNOSIS — I10 Essential (primary) hypertension: Secondary | ICD-10-CM | POA: Insufficient documentation

## 2018-11-02 DIAGNOSIS — R51 Headache: Secondary | ICD-10-CM | POA: Insufficient documentation

## 2018-11-02 DIAGNOSIS — Z79899 Other long term (current) drug therapy: Secondary | ICD-10-CM | POA: Insufficient documentation

## 2018-11-02 DIAGNOSIS — R519 Headache, unspecified: Secondary | ICD-10-CM

## 2018-11-02 DIAGNOSIS — F17210 Nicotine dependence, cigarettes, uncomplicated: Secondary | ICD-10-CM | POA: Insufficient documentation

## 2018-11-02 DIAGNOSIS — I110 Hypertensive heart disease with heart failure: Secondary | ICD-10-CM | POA: Insufficient documentation

## 2018-11-02 DIAGNOSIS — M26622 Arthralgia of left temporomandibular joint: Secondary | ICD-10-CM | POA: Insufficient documentation

## 2018-11-02 LAB — CBC WITH DIFFERENTIAL/PLATELET
Abs Immature Granulocytes: 0.01 10*3/uL (ref 0.00–0.07)
Basophils Absolute: 0.1 10*3/uL (ref 0.0–0.1)
Basophils Relative: 1 %
Eosinophils Absolute: 0.2 10*3/uL (ref 0.0–0.5)
Eosinophils Relative: 3 %
HCT: 43.8 % (ref 39.0–52.0)
Hemoglobin: 13.6 g/dL (ref 13.0–17.0)
Immature Granulocytes: 0 %
Lymphocytes Relative: 22 %
Lymphs Abs: 1.7 10*3/uL (ref 0.7–4.0)
MCH: 27.1 pg (ref 26.0–34.0)
MCHC: 31.1 g/dL (ref 30.0–36.0)
MCV: 87.4 fL (ref 80.0–100.0)
Monocytes Absolute: 0.8 10*3/uL (ref 0.1–1.0)
Monocytes Relative: 10 %
Neutro Abs: 5 10*3/uL (ref 1.7–7.7)
Neutrophils Relative %: 64 %
Platelets: 164 10*3/uL (ref 150–400)
RBC: 5.01 MIL/uL (ref 4.22–5.81)
RDW: 15.3 % (ref 11.5–15.5)
WBC: 7.8 10*3/uL (ref 4.0–10.5)
nRBC: 0 % (ref 0.0–0.2)

## 2018-11-02 LAB — BASIC METABOLIC PANEL
Anion gap: 8 (ref 5–15)
BUN: 17 mg/dL (ref 6–20)
CO2: 28 mmol/L (ref 22–32)
Calcium: 9.2 mg/dL (ref 8.9–10.3)
Chloride: 106 mmol/L (ref 98–111)
Creatinine, Ser: 1.25 mg/dL — ABNORMAL HIGH (ref 0.61–1.24)
GFR calc Af Amer: >60 mL/min (ref >60)
GFR calc non Af Amer: >60 mL/min (ref >60)
Glucose, Bld: 111 mg/dL — ABNORMAL HIGH (ref 70–99)
Potassium: 3.9 mmol/L (ref 3.5–5.1)
Sodium: 142 mmol/L (ref 135–145)

## 2018-11-02 MED ORDER — DIPHENHYDRAMINE HCL 50 MG/ML IJ SOLN
12.5000 mg | Freq: Once | INTRAMUSCULAR | Status: AC
  Administered 2018-11-02: 12.5 mg via INTRAVENOUS
  Filled 2018-11-02: qty 1

## 2018-11-02 MED ORDER — HYDRALAZINE HCL 20 MG/ML IJ SOLN
10.0000 mg | Freq: Once | INTRAMUSCULAR | Status: AC
  Administered 2018-11-02: 10 mg via INTRAVENOUS
  Filled 2018-11-02: qty 1

## 2018-11-02 MED ORDER — LOSARTAN POTASSIUM 50 MG PO TABS
50.0000 mg | ORAL_TABLET | Freq: Every day | ORAL | Status: DC
  Administered 2018-11-02: 50 mg via ORAL
  Filled 2018-11-02: qty 1

## 2018-11-02 MED ORDER — PROCHLORPERAZINE EDISYLATE 10 MG/2ML IJ SOLN
10.0000 mg | Freq: Once | INTRAMUSCULAR | Status: AC
  Administered 2018-11-02: 10 mg via INTRAVENOUS
  Filled 2018-11-02: qty 2

## 2018-11-02 MED ORDER — HYDRALAZINE HCL 20 MG/ML IJ SOLN: 10.0000 mg | Freq: Once | INTRAMUSCULAR | Status: DC

## 2018-11-02 MED ORDER — LOSARTAN POTASSIUM 100 MG PO TABS: 100.0000 mg | ORAL_TABLET | Freq: Every day | ORAL | 0 refills | Status: DC

## 2018-11-02 MED ORDER — KETOROLAC TROMETHAMINE 60 MG/2ML IM SOLN
30.0000 mg | Freq: Once | INTRAMUSCULAR | Status: AC
  Administered 2018-11-02: 30 mg via INTRAMUSCULAR
  Filled 2018-11-02: qty 2

## 2018-11-02 NOTE — ED Provider Notes (Signed)
[facility] EMERGENCY DEPARTMENT Provider Note   CSN: [id number] Arrival date & time: 11/02/18  0701    History   Chief Complaint Chief Complaint  Patient presents with  . Headache  . Jaw Pain    HPI Dale Mcintosh is a 33 y.o. male with history of hypertension, OSA, obesity, CHF presenting for evaluation of gradual onset, constant frontal headache for 2 days and acute onset right jaw pain beginning this morning.  He reports frontal headache similar to headaches he has had in the past.  He is taken Excedrin Migraine without relief.  He denies vision changes, nausea, vomiting, numbness, weakness, dizziness.  He denies any chest pain or shortness of breath outside of his usual "because I'm a big guy".  No fever or neck pain, no recent travel.  No aggravating or alleviating factors noted.  He awoke this morning with pain in his right jaw that he experiences when he opens his mouth too wide and while eating.  Denies dental pain or dental injury, no facial swelling.  Does report that he has a history of dental procedures on the side of his mandible.  Reports he has been compliant with his blood pressure medications but does not have a blood pressure cuff at home and so does not know how his blood pressure has been running.  He was seen by his cardiologist via telemedicine visit on 09/22/2018 for admission for hypertensive urgency.  Coronaries were normal on CTA, EF reduced at 40 to 45%.  He had not been seen by a cardiologist since 2018 prior to that.  Cardiologist wanted to uptitrate medications but since patient did not have a blood pressure cuff at home wanted to hold off until he had blood pressure readings.       The history is provided by the patient.    Past Medical History:  Diagnosis Date  . Hypertension   . OSA (obstructive sleep apnea)     Patient Active Problem List   Diagnosis Date Noted  . Educated About Covid-19 Virus Infection 09/23/2018  . Acute systolic  HF (heart failure) ([place]) 09/23/2018  . Hypertensive urgency 09/23/2018  . Chest pain 08/28/2018  . Excessive daytime sleepiness 02/01/2017  . Morning headache 02/01/2017  . Morbid obesity ([place]) 02/01/2017  . Snoring 02/01/2017  . Hypertensive emergency 02/01/2017    Past Surgical History:  Procedure Laterality Date  . NO PAST SURGERIES    . None          Home Medications    Prior to Admission medications   Medication Sig Start Date End Date Taking? Authorizing Provider  aspirin-acetaminophen-caffeine (EXCEDRIN MIGRAINE) [phone] MG tablet Take 2 tablets by mouth every 6 (six) hours as needed for headache.   Yes [provider]  carvedilol (COREG) 25 MG tablet Take 1 tablet (25 mg total) by mouth 2 (two) times daily with a meal. 09/22/18  Yes Minus Breeding, MD  losartan (COZAAR) 100 MG tablet Take 1 tablet (100 mg total) by mouth daily. 11/02/18   Nils Flack, Tykera Skates A, PA-C  nitroGLYCERIN (NITROSTAT) 0.4 MG SL tablet Place 1 tablet (0.4 mg total) under the tongue every 5 (five) minutes as needed for chest pain (per CT heart protocol). Patient not taking: Reported on 11/02/2018 08/29/18   Aline August, MD    Family History Family History  Problem Relation Age of Onset  . Hypertension Father     Social History Social History   Tobacco Use  . Smoking status: Current Every  Day Smoker    Packs/day: 0.50    Types: Cigarettes  . Smokeless tobacco: Current User  Substance Use Topics  . Alcohol use: Yes    Comment: occ  . Drug use: No     Allergies   Bee venom and Shrimp [shellfish allergy]   Review of Systems Review of Systems  Constitutional: Negative for chills and fever.  HENT: Negative for facial swelling and trouble swallowing.        +jaw pain  Eyes: Negative for photophobia and visual disturbance.  Respiratory: Negative for shortness of breath.   Cardiovascular: Negative for chest pain.  Gastrointestinal: Negative for abdominal pain, nausea and  vomiting.  Neurological: Positive for headaches. Negative for dizziness, syncope, weakness and numbness.  All other systems reviewed and are negative.    Physical Exam Updated Vital Signs BP (!) 139/91   Pulse 64   Temp 98.3 F (36.8 C) (Oral)   Resp 18   SpO2 100%   Physical Exam Vitals signs and nursing note reviewed.  Constitutional:      General: He is not in acute distress.    Appearance: He is well-developed.  HENT:     Head: Normocephalic and atraumatic.     Jaw: There is normal jaw occlusion. Pain on movement present. No trismus or swelling.     Comments: Mouth opens to at least 3 finger widths.  No facial swelling.  No dental injury and dentition appears stable.  No oral mucosal swelling.  No submental tenderness or tenderness to palpation of the structures of the neck.  He has pain along the right TMJ with opening the mouth wide.  Handling oral secretions without difficulty.  Normal phonation. Eyes:     General:        Right eye: No discharge.        Left eye: No discharge.     Extraocular Movements: Extraocular movements intact.     Conjunctiva/sclera: Conjunctivae normal.     Pupils: Pupils are equal, round, and reactive to light.  Neck:     Musculoskeletal: Normal range of motion and neck supple. No neck rigidity.     Vascular: No JVD.     Trachea: No tracheal deviation.  Cardiovascular:     Rate and Rhythm: Normal rate and regular rhythm.     Heart sounds: Normal heart sounds.  Pulmonary:     Effort: Pulmonary effort is normal.     Breath sounds: Normal breath sounds.  Abdominal:     General: Bowel sounds are normal. There is no distension.     Palpations: Abdomen is soft.     Tenderness: There is no abdominal tenderness.  Musculoskeletal:        General: No swelling or tenderness.  Lymphadenopathy:     Cervical: No cervical adenopathy.  Skin:    General: Skin is warm and dry.     Findings: No erythema.  Neurological:     Mental Status: He is alert  and oriented to person, place, and time.     GCS: GCS eye subscore is 4. GCS verbal subscore is 5. GCS motor subscore is 6.     Cranial Nerves: No cranial nerve deficit, dysarthria or facial asymmetry.     Sensory: No sensory deficit.     Motor: No weakness.     Coordination: Romberg sign negative. Coordination normal.     Gait: Gait normal.     Comments: Mental Status:  Alert, thought content appropriate, able to give a coherent  history. Speech fluent without evidence of aphasia. Able to follow 2 step commands without difficulty.  Cranial Nerves:  II:  Peripheral visual fields grossly normal, pupils equal, round, reactive to light III,IV, VI: ptosis not present, extra-ocular motions intact bilaterally  V,VII: smile symmetric, facial light touch sensation equal VIII: hearing grossly normal to voice  X: uvula elevates symmetrically  XI: bilateral shoulder shrug symmetric and strong XII: midline tongue extension without fassiculations Motor:  Normal tone. 5/5 strength of BUE and BLE major muscle groups including strong and equal grip strength and dorsiflexion/plantar flexion Sensory: light touch normal in all extremities. Cerebellar: normal finger-to-nose with bilateral upper extremities Gait: normal gait and balance. Able to walk on toes and heels with ease.     Psychiatric:        Behavior: Behavior normal.      ED Treatments / Results  Labs (all labs ordered are listed, but only abnormal results are displayed) Labs Reviewed  BASIC METABOLIC PANEL - Abnormal; Notable for the following components:      Result Value   Glucose, Bld 111 (*)    Creatinine, Ser 1.25 (*)    All other components within normal limits  CBC WITH DIFFERENTIAL/PLATELET    EKG EKG Interpretation  Date/Time:  Monday November 02 2018 07:58:10 EDT Ventricular Rate:  71 PR Interval:    QRS Duration: 98 QT Interval:  408 QTC Calculation: 444 R Axis:   27 Text Interpretation:  Sinus rhythm Probable left  atrial enlargement repolarization abnormality, no sig change from older tracings Confirmed by Arby BarrettePfeiffer, Marcy [phone]([id number]) on 11/02/2018 8:35:26 AM   Radiology No results found.  Procedures Procedures (including critical care time)  Medications Ordered in ED Medications  losartan (COZAAR) tablet 50 mg (50 mg Oral Given 11/02/18 0858)  hydrALAZINE (APRESOLINE) injection 10 mg (has no administration in time range)  ketorolac (TORADOL) injection 30 mg (30 mg Intramuscular Given 11/02/18 0744)  hydrALAZINE (APRESOLINE) injection 10 mg (10 mg Intravenous Given 11/02/18 0858)  prochlorperazine (COMPAZINE) injection 10 mg (10 mg Intravenous Given 11/02/18 0905)  diphenhydrAMINE (BENADRYL) injection 12.5 mg (12.5 mg Intravenous Given 11/02/18 0904)     Initial Impression / Assessment and Plan / ED Course  I have reviewed the triage vital signs and the nursing notes.  Pertinent labs & imaging results that were available during my care of the patient were reviewed by me and considered in my medical decision making (see chart for details).        Patient presenting with headache and jaw pain.  He is afebrile, hypertensive while in the ED.  He has a history of uncontrolled hypertension, recent telemedicine visit with his cardiologist last month but still does not have a blood pressure cuff.  He is nontoxic in appearance.  Normal neurologic examination.  Doubt CVA, hypertensive encephalopathy, ICH, SAH, or meningitis.  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  No evidence of dental abscess, Ludwig's angina, or significant dental decay.  Will give Toradol for headache and TMJ pain and get EKG and reassess.  EKG shows no acute ischemic changes, no arrhythmia.  8:40AM Reassessed patient.  He continues to rest comfortably but remains hypertensive and states his headache is still present.  Will obtain blood work and give medications and reassess.  His losartan  dose can be increased and we will do so and send him home on this if his blood pressure improves while in the ED.  Lab work  reviewed by me shows no leukocytosis, no anemia, no metabolic derangements.  He has some mild renal insufficiency with mildly elevated creatinine but BUN is within normal limits and in general this appears to be his baseline based on chart review.  He received hydralazine, losartan in the ED as well as some Compazine and Benadryl for his headache with significant improvement in both his headache and his hypertension on reassessment.  He reports on reassessment that his headache has entirely resolved and that he is feeling much better.  He feels comfortable with discharge home.  No evidence of endorgan damage or hypertensive emergency.  We will increase his losartan 100 mg daily and have him follow-up closely with his cardiologist.  He states he thinks he has an appointment coming up this week.  I also encouraged the patient to buy blood pressure cuff so that he can monitor his blood pressures at home.  We also discussed follow-up with a dentist or ENT for evaluation of his TMJ if his symptoms persist.  Discussed strict ED return precautions. Patient verbalized understanding of and agreement with plan and is safe for discharge home at this time. Discussed with Dr. Donnald GarrePfeiffer who agrees with assessment and plan at this time.   Final Clinical Impressions(s) / ED Diagnoses   Final diagnoses:  Bad headache  Tenderness of left temporomandibular joint  Hypertension, unspecified type    ED Discharge Orders         Ordered    losartan (COZAAR) 100 MG tablet  Daily     11/02/18 1134           Jeanie SewerFawze, Reshonda Koerber A, PA-C 11/02/18 1204    Arby BarrettePfeiffer, Marcy, MD 11/03/18 [phone]

## 2018-11-02 NOTE — Discharge Instructions (Signed)
Increase your losartan to 100 mg daily.  Continue taking your carvedilol.  You should buy a blood pressure cuff which you can get online or at any pharmacy.  Check your blood pressures daily around the same time after taking your medications and keep a log of your blood pressures so that you can go over them with your cardiologist.  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  You can take ibuprofen or Tylenol (usually 400-6 or milligrams of ibuprofen and 500 to 1000 mg of Tylenol at a time.  I recommend alternating every 3-6 hours as needed for pain control or only sticking with 1 medication every 6 hours).  You can also apply a cold compress or heating pad, whichever feels best, to the area to give yourself some relief.  Eat a diet of soft foods that do not involve excessive chewing.  If your pain persist you can follow-up with a dentist or an ENT for reevaluation.  Return to the emergency department if any concerning signs or symptoms develop such as severely high blood pressures, headaches, persistent vomiting, chest pain, or decreased urine output.  If your blood pressure (BP) was elevated on multiple readings during this visit above 130 for the top number or above 80 for the bottom number, please have this repeated by your primary care provider within one month. You can also check your blood pressure when you are out at a pharmacy or grocery store. Many have machines that will check your blood pressure.  If your blood pressure remains elevated, please follow-up with your PCP.

## 2018-11-02 NOTE — ED Triage Notes (Signed)
Pt arrives to ed with co right sided jaw pain and headache. Headache started two days ago. Pt took Excedrin Migraine extra strength and no relief. Pt states the jaw pain started this morning.

## 2018-11-09 ENCOUNTER — Ambulatory Visit (INDEPENDENT_AMBULATORY_CARE_PROVIDER_SITE_OTHER): Payer: Self-pay

## 2018-11-09 ENCOUNTER — Other Ambulatory Visit: Payer: Self-pay

## 2018-11-09 ENCOUNTER — Ambulatory Visit (HOSPITAL_COMMUNITY)
Admission: EM | Admit: 2018-11-09 | Discharge: 2018-11-09 | Disposition: A | Discharge status: Home / Self Care | Payer: Self-pay | Attending: Family Medicine | Admitting: Family Medicine

## 2018-11-09 ENCOUNTER — Encounter (HOSPITAL_COMMUNITY): Payer: Self-pay

## 2018-11-09 DIAGNOSIS — K5900 Constipation, unspecified: Secondary | ICD-10-CM

## 2018-11-09 LAB — POCT URINALYSIS DIP (DEVICE)
Bilirubin Urine: NEGATIVE
Glucose, UA: NEGATIVE mg/dL
Hgb urine dipstick: NEGATIVE
Ketones, ur: NEGATIVE mg/dL
Leukocytes,Ua: NEGATIVE
Nitrite: NEGATIVE
Protein, ur: 30 mg/dL — AB
Specific Gravity, Urine: 1.025 (ref 1.005–1.030)
Urobilinogen, UA: 1 mg/dL (ref 0.0–1.0)
pH: 5.5 (ref 5.0–8.0)

## 2018-11-09 MED ORDER — POLYETHYLENE GLYCOL 3350 17 GM/SCOOP PO POWD: 1.0000 | Freq: Once | ORAL | 0 refills | Status: AC

## 2018-11-09 NOTE — ED Provider Notes (Signed)
[place]    CSN: [id number] Arrival date & time: 11/09/18  1107     History   Chief Complaint Chief Complaint  Patient presents with  . Urinary Tract Infection    HPI Ryman Rathgeber is a 33 y.o. male.   Pt is a 32 year old male with PMH of hypertension, OSA, CHF, morbid obesity.  He presents today with approximate 1 week of worsening lower abdominal discomfort. Generalized across entire lower abdomen.   Describes it as cramping and sharp at times.  Sometimes the discomfort radiates into his rectal area and testicles.  Unsure of last bowel movement.  Reports a lot of straining with bowel movement.  Denies any rectal bleeding, nausea or vomiting.  Denies any trouble with urination.  No testicle swelling, penile swelling, rashes or discharge.  Denies any concern for STDs.  ROS per HPI      Past Medical History:  Diagnosis Date  . Hypertension   . OSA (obstructive sleep apnea)     Patient Active Problem List   Diagnosis Date Noted  . Educated About Covid-19 Virus Infection 09/23/2018  . Acute systolic HF (heart failure) ([facility]) 09/23/2018  . Hypertensive urgency 09/23/2018  . Chest pain 08/28/2018  . Excessive daytime sleepiness 02/01/2017  . Morning headache 02/01/2017  . Morbid obesity ([facility]) 02/01/2017  . Snoring 02/01/2017  . Hypertensive emergency 02/01/2017    Past Surgical History:  Procedure Laterality Date  . NO PAST SURGERIES    . None         Home Medications    Prior to Admission medications   Medication Sig Start Date End Date Taking? Authorizing Provider  aspirin-acetaminophen-caffeine (EXCEDRIN MIGRAINE) [phone] MG tablet Take 2 tablets by mouth every 6 (six) hours as needed for headache.    [provider]  carvedilol (COREG) 25 MG tablet Take 1 tablet (25 mg total) by mouth 2 (two) times daily with a meal. 09/22/18   Minus Breeding, MD  losartan (COZAAR) 100 MG tablet Take 1 tablet (100 mg total) by mouth daily. 11/02/18    Nils Flack, Mina A, PA-C  nitroGLYCERIN (NITROSTAT) 0.4 MG SL tablet Place 1 tablet (0.4 mg total) under the tongue every 5 (five) minutes as needed for chest pain (per CT heart protocol). Patient not taking: Reported on 11/02/2018 08/29/18   Aline August, MD  polyethylene glycol powder (GLYCOLAX/MIRALAX) 17 GM/SCOOP powder Take 255 g by mouth once for 1 dose. 11/09/18 11/09/18  Orvan July, NP    Family History Family History  Problem Relation Age of Onset  . Hypertension Father     Social History Social History   Tobacco Use  . Smoking status: Current Every Day Smoker    Packs/day: 0.50    Types: Cigarettes  . Smokeless tobacco: Current User  Substance Use Topics  . Alcohol use: Yes    Comment: occ  . Drug use: No     Allergies   Bee venom and Shrimp [shellfish allergy]   Review of Systems Review of Systems   Physical Exam Triage Vital Signs ED Triage Vitals [11/09/18 1136]  Enc Vitals Group     BP (!) 158/116     Pulse Rate 84     Resp 18     Temp 99.3 F (37.4 C)     Temp Source Oral     SpO2 97 %     Weight      Height      Head Circumference  Peak Flow      Pain Score 7     Pain Loc      Pain Edu?      Excl. in [place]?    No data found.  Updated Vital Signs BP (!) 158/116 (BP Location: Left Arm)   Pulse 84   Temp 99.3 F (37.4 C) (Oral)   Resp 18   SpO2 97%   Visual Acuity Right Eye Distance:   Left Eye Distance:   Bilateral Distance:    Right Eye Near:   Left Eye Near:    Bilateral Near:     Physical Exam Vitals signs and nursing note reviewed.  Constitutional:      General: He is not in acute distress.    Appearance: Normal appearance. He is obese. He is not ill-appearing, toxic-appearing or diaphoretic.  HENT:     Head: Normocephalic and atraumatic.     Nose: Nose normal.  Eyes:     Conjunctiva/sclera: Conjunctivae normal.  Neck:     Musculoskeletal: Normal range of motion.  Pulmonary:     Effort: Pulmonary effort is normal.   Abdominal:     General: Abdomen is protuberant. Bowel sounds are decreased. There is no abdominal bruit.     Palpations: Abdomen is soft.     Tenderness: There is abdominal tenderness in the right lower quadrant and left lower quadrant. There is no right CVA tenderness, left CVA tenderness, guarding or rebound.     Hernia: No hernia is present.  Musculoskeletal: Normal range of motion.  Skin:    General: Skin is warm and dry.  Neurological:     General: No focal deficit present.     Mental Status: He is alert.  Psychiatric:        Mood and Affect: Mood normal.      UC Treatments / Results  Labs (all labs ordered are listed, but only abnormal results are displayed) Labs Reviewed  POCT URINALYSIS DIP (DEVICE) - Abnormal; Notable for the following components:      Result Value   Protein, ur 30 (*)    All other components within normal limits    EKG   Radiology Dg Abd 1 View  Result Date: 11/09/2018 CLINICAL DATA:  33 year old male with lower abdominal pain. Evaluate for constipation. EXAM: ABDOMEN - 1 VIEW COMPARISON:  None. FINDINGS: There is moderate amount of stool throughout the colon. No bowel dilatation or evidence of obstruction. No free air or radiopaque calculi identified on the provided image. The osseous structures and soft tissues are unremarkable. IMPRESSION: Moderate colonic stool burden.  No bowel obstruction. Electronically Signed   By: Elgie CollardArash  Radparvar M.D.   On: 11/09/2018 13:03    Procedures Procedures (including critical care time)  Medications Ordered in UC Medications - No data to display  Initial Impression / Assessment and Plan / UC Course  I have reviewed the triage vital signs and the nursing notes.  Pertinent labs & imaging results that were available during my care of the patient were reviewed by me and considered in my medical decision making (see chart for details).     Pt is a 33 year old male with worsening lower abdominal discomfort.   Most likely constipation based on HPI and physical exam.  No concern today for appendicitis.  No rebound. Urine negative for urinary tract infection We will do abdominal x-ray to rule out constipation.  X-ray revealed moderate stool burden. We will treat this with MiraLAX Instructions given on how to  prevent constipation Recommended if symptoms continue or worsen he will need to be seen in the ER. Final Clinical Impressions(s) / UC Diagnoses   Final diagnoses:  Constipation, unspecified constipation type     Discharge Instructions     Your x-ray showed a moderate amount of stool.  I believe that your symptoms are related to constipation. We are treating this with MiraLAX You can do 1 scoop twice a day with beverage of choice until you get a good bowel movement. You can then back off to 1 scoop a day or 1 every couple days.  If your symptoms worsen or you not have a good bowel movement despite treatment you need to go to the ER.  This may take a few days.    ED Prescriptions    Medication Sig Dispense Auth. Provider   polyethylene glycol powder (GLYCOLAX/MIRALAX) 17 GM/SCOOP powder Take 255 g by mouth once for 1 dose. 255 g Dahlia Byes A, NP     Controlled Substance Prescriptions [place] Controlled Substance Registry consulted? Not Applicable   Janace Aris, NP 11/09/18 1313

## 2018-11-09 NOTE — Discharge Instructions (Signed)
Your x-ray showed a moderate amount of stool.  I believe that your symptoms are related to constipation. We are treating this with MiraLAX You can do 1 scoop twice a day with beverage of choice until you get a good bowel movement. You can then back off to 1 scoop a day or 1 every couple days.  If your symptoms worsen or you not have a good bowel movement despite treatment you need to go to the ER.  This may take a few days.

## 2018-11-09 NOTE — ED Triage Notes (Signed)
Pt presents with lower abdominal pain X 1 week.

## 2018-11-11 ENCOUNTER — Encounter (HOSPITAL_COMMUNITY): Payer: Self-pay

## 2018-11-11 ENCOUNTER — Other Ambulatory Visit: Payer: Self-pay

## 2018-11-11 ENCOUNTER — Ambulatory Visit (HOSPITAL_COMMUNITY)
Admission: EM | Admit: 2018-11-11 | Discharge: 2018-11-11 | Disposition: A | Discharge status: Home / Self Care | Payer: Self-pay

## 2018-11-11 DIAGNOSIS — R61 Generalized hyperhidrosis: Secondary | ICD-10-CM

## 2018-11-11 DIAGNOSIS — I160 Hypertensive urgency: Secondary | ICD-10-CM

## 2018-11-11 NOTE — Discharge Instructions (Addendum)
It is very important to take your blood pressure medications every day as prescribed. You may take these medications without food. Please follow-up with your PCP regarding your blood pressure: Recommend that you keep blood pressure log between now and then. Return if you develop headache, change in vision, chest pain, shortness of breath, severe abdominal pain, lower leg swelling.

## 2018-11-11 NOTE — ED Triage Notes (Signed)
t states he has been getting cold sweats for 3 days. Pt was sent home from work. Pt states he needs a work note. Pt states he was here last week  For his congestion issue. Pt states he has not been eating in the last 3 days. Pt states he has not taken his B/P med either.

## 2018-11-11 NOTE — ED Provider Notes (Signed)
[facility]    CSN: [id number] Arrival date & time: 11/11/18  8325     History   Chief Complaint Chief Complaint  Patient presents with  . cold sweat    HPI Dale Mcintosh is a 33 y.o. male history of hypertension, OSA, CHF, obesity presenting for 3-day course of night sweats and decreased appetite.  Patient states that he has been sleeping under the fan, waking up feeling damp from sweating.  Patient is also had decreased appetite for the last 3 days.  Patient was seen on 7/6 for concern of constipation, did not endorse night sweats at that appointment.  Patient was treated with MiraLAX, has been taking that daily with a successful bowel movement this morning that was without melena, mucus, blood, or pain.  Patient denies abdominal pain, nausea, vomiting.  No recent sick contacts, fever, cough, shortness of breath.  Patient's blood pressure is elevated today: States he did not take his medications as he thought he had to take it with food.  Patient denies headache, change in vision, chest pain, shortness of breath, dyspnea on exertion, lower extremity me or claudication.  Patient works as a Financial risk analyst, does not routinely perform heavy lifting or prolonged exertional activities.  Patient states that he has a follow-up scheduled with his PCP regarding his blood pressure, intends to keep this.  Recently bought a BP cuff from [employer] better monitor at home.   Past Medical History:  Diagnosis Date  . Hypertension   . OSA (obstructive sleep apnea)     Patient Active Problem List   Diagnosis Date Noted  . Educated About Covid-19 Virus Infection 09/23/2018  . Acute systolic HF (heart failure) ([facility]) 09/23/2018  . Hypertensive urgency 09/23/2018  . Chest pain 08/28/2018  . Excessive daytime sleepiness 02/01/2017  . Morning headache 02/01/2017  . Morbid obesity ([facility]) 02/01/2017  . Snoring 02/01/2017  . Hypertensive emergency 02/01/2017    Past Surgical History:  Procedure Laterality  Date  . NO PAST SURGERIES    . None         Home Medications    Prior to Admission medications   Medication Sig Start Date End Date Taking? Authorizing Provider  aspirin-acetaminophen-caffeine (EXCEDRIN MIGRAINE) [phone] MG tablet Take 2 tablets by mouth every 6 (six) hours as needed for headache.    [provider]  carvedilol (COREG) 25 MG tablet Take 1 tablet (25 mg total) by mouth 2 (two) times daily with a meal. 09/22/18   Rollene Rotunda, MD  losartan (COZAAR) 100 MG tablet Take 1 tablet (100 mg total) by mouth daily. 11/02/18   Luevenia Maxin, Mina A, PA-C  nitroGLYCERIN (NITROSTAT) 0.4 MG SL tablet Place 1 tablet (0.4 mg total) under the tongue every 5 (five) minutes as needed for chest pain (per CT heart protocol). Patient not taking: Reported on 11/02/2018 08/29/18   Glade Lloyd, MD    Family History Family History  Problem Relation Age of Onset  . Hypertension Father     Social History Social History   Tobacco Use  . Smoking status: Current Every Day Smoker    Packs/day: 0.50    Types: Cigarettes  . Smokeless tobacco: Current User  Substance Use Topics  . Alcohol use: Yes    Comment: occ  . Drug use: No     Allergies   Bee venom and Shrimp [shellfish allergy]   Review of Systems Review of Systems  Constitutional: Positive for appetite change. Negative for fatigue and fever.  Respiratory: Negative for  cough, shortness of breath and wheezing.   Cardiovascular: Negative for chest pain and palpitations.     Physical Exam Triage Vital Signs ED Triage Vitals  Enc Vitals Group     BP      Pulse      Resp      Temp      Temp src      SpO2      Weight      Height      Head Circumference      Peak Flow      Pain Score      Pain Loc      Pain Edu?      Excl. in [place]?    No data found.  Updated Vital Signs BP (!) 155/107 (BP Location: Right Arm)   Pulse 99   Temp 98.8 F (37.1 C) (Oral)   Resp 18   Wt 280 lb (127 kg)   SpO2 99%   BMI  39.61 kg/m   Visual Acuity Right Eye Distance:   Left Eye Distance:   Bilateral Distance:    Right Eye Near:   Left Eye Near:    Bilateral Near:     Physical Exam Constitutional:      General: He is not in acute distress.    Appearance: He is obese. He is not diaphoretic.  HENT:     Head: Normocephalic and atraumatic.     Right Ear: Tympanic membrane, ear canal and external ear normal.     Left Ear: Tympanic membrane, ear canal and external ear normal.     Nose: Nose normal.     Mouth/Throat:     Mouth: Mucous membranes are moist.     Pharynx: Oropharynx is clear. No oropharyngeal exudate or posterior oropharyngeal erythema.  Eyes:     General: No scleral icterus.       Right eye: No discharge.        Left eye: No discharge.     Extraocular Movements: Extraocular movements intact.     Conjunctiva/sclera: Conjunctivae normal.     Pupils: Pupils are equal, round, and reactive to light.  Neck:     Musculoskeletal: Neck supple. No muscular tenderness.     Vascular: No carotid bruit.  Cardiovascular:     Rate and Rhythm: Normal rate and regular rhythm.     Pulses: Normal pulses.     Heart sounds: Normal heart sounds.  Pulmonary:     Effort: Pulmonary effort is normal. No respiratory distress.     Breath sounds: Normal breath sounds. No wheezing.  Abdominal:     General: Bowel sounds are normal. There is no distension.     Palpations: Abdomen is soft.     Tenderness: There is no abdominal tenderness. There is no right CVA tenderness, left CVA tenderness or guarding.  Musculoskeletal: Normal range of motion.  Lymphadenopathy:     Cervical: No cervical adenopathy.  Skin:    General: Skin is warm.     Capillary Refill: Capillary refill takes less than 2 seconds.     Coloration: Skin is not jaundiced or pale.  Neurological:     General: No focal deficit present.     Mental Status: He is alert and oriented to person, place, and time.  Psychiatric:        Thought Content:  Thought content normal.        Judgment: Judgment normal.      UC Treatments / Results  Labs (all  labs ordered are listed, but only abnormal results are displayed) Labs Reviewed - No data to display  EKG   Radiology Dg Abd 1 View  Result Date: 11/09/2018 CLINICAL DATA:  33 year old male with lower abdominal pain. Evaluate for constipation. EXAM: ABDOMEN - 1 VIEW COMPARISON:  None. FINDINGS: There is moderate amount of stool throughout the colon. No bowel dilatation or evidence of obstruction. No free air or radiopaque calculi identified on the provided image. The osseous structures and soft tissues are unremarkable. IMPRESSION: Moderate colonic stool burden.  No bowel obstruction. Electronically Signed   By: Elgie CollardArash  Radparvar M.D.   On: 11/09/2018 13:03    Procedures Procedures (including critical care time)  Medications Ordered in UC Medications - No data to display  Initial Impression / Assessment and Plan / UC Course  I have reviewed the triage vital signs and the nursing notes.  Pertinent labs & imaging results that were available during my care of the patient were reviewed by me and considered in my medical decision making (see chart for details).     33 year old male with history of obesity, OSA, hypertension, CHF presenting for 3-day course of night sweats.  Patient appears well, no diaphoresis or alarm symptoms in office today.  Patient's blood pressure is elevated, though stable from previous urgent/emergent visits.  Encourage patient to take blood pressure medications regardless of if he is eating or not, keep blood pressure log, follow-up with PCP.  Strict return precautions were discussed, patient verbalized understanding is agreeable to plan.  Note provided as patient appears well, and feels he is able to complete his job duties. Final Clinical Impressions(s) / UC Diagnoses   Final diagnoses:  Hypertensive urgency  Sweating increase     Discharge Instructions      It is very important to take your blood pressure medications every day as prescribed. You may take these medications without food. Please follow-up with your PCP regarding your blood pressure: Recommend that you keep blood pressure log between now and then. Return if you develop headache, change in vision, chest pain, shortness of breath, severe abdominal pain, lower leg swelling.    ED Prescriptions    None     Controlled Substance Prescriptions [place] Controlled Substance Registry consulted? Not Applicable   Shea EvansHall-Potvin, Brittany, [place] JerseyPA-C 11/11/18 [id number]

## 2018-11-12 ENCOUNTER — Encounter (HOSPITAL_COMMUNITY): Payer: Self-pay | Admitting: Emergency Medicine

## 2018-11-12 ENCOUNTER — Ambulatory Visit (HOSPITAL_COMMUNITY)
Admission: EM | Admit: 2018-11-12 | Discharge: 2018-11-12 | Disposition: A | Discharge status: Home / Self Care | Payer: Self-pay | Attending: Family Medicine | Admitting: Family Medicine

## 2018-11-12 DIAGNOSIS — R42 Dizziness and giddiness: Secondary | ICD-10-CM | POA: Insufficient documentation

## 2018-11-12 LAB — COMPREHENSIVE METABOLIC PANEL
ALT: 35 U/L (ref 0–44)
AST: 34 U/L (ref 15–41)
Albumin: 3.5 g/dL (ref 3.5–5.0)
Alkaline Phosphatase: 61 U/L (ref 38–126)
Anion gap: 14 (ref 5–15)
BUN: 20 mg/dL (ref 6–20)
CO2: 25 mmol/L (ref 22–32)
Calcium: 9.1 mg/dL (ref 8.9–10.3)
Chloride: 99 mmol/L (ref 98–111)
Creatinine, Ser: 1.91 mg/dL — ABNORMAL HIGH (ref 0.61–1.24)
GFR calc Af Amer: 53 mL/min — ABNORMAL LOW (ref >60)
GFR calc non Af Amer: 45 mL/min — ABNORMAL LOW (ref >60)
Glucose, Bld: 103 mg/dL — ABNORMAL HIGH (ref 70–99)
Potassium: 3.5 mmol/L (ref 3.5–5.1)
Sodium: 138 mmol/L (ref 135–145)
Total Bilirubin: 0.7 mg/dL (ref 0.3–1.2)
Total Protein: 7.8 g/dL (ref 6.5–8.1)

## 2018-11-12 LAB — CBC WITH DIFFERENTIAL/PLATELET
Abs Immature Granulocytes: 0.04 10*3/uL (ref 0.00–0.07)
Basophils Absolute: 0 10*3/uL (ref 0.0–0.1)
Basophils Relative: 0 %
Eosinophils Absolute: 0.3 10*3/uL (ref 0.0–0.5)
Eosinophils Relative: 3 %
HCT: 41.1 % (ref 39.0–52.0)
Hemoglobin: 13.4 g/dL (ref 13.0–17.0)
Immature Granulocytes: 0 %
Lymphocytes Relative: 12 %
Lymphs Abs: 1.1 10*3/uL (ref 0.7–4.0)
MCH: 27.8 pg (ref 26.0–34.0)
MCHC: 32.6 g/dL (ref 30.0–36.0)
MCV: 85.3 fL (ref 80.0–100.0)
Monocytes Absolute: 0.9 10*3/uL (ref 0.1–1.0)
Monocytes Relative: 10 %
Neutro Abs: 6.9 10*3/uL (ref 1.7–7.7)
Neutrophils Relative %: 75 %
Platelets: 179 10*3/uL (ref 150–400)
RBC: 4.82 MIL/uL (ref 4.22–5.81)
RDW: 15 % (ref 11.5–15.5)
WBC: 9.3 10*3/uL (ref 4.0–10.5)
nRBC: 0 % (ref 0.0–0.2)

## 2018-11-12 NOTE — ED Triage Notes (Signed)
Pt here for breaking out into a sweat. Pt was seen here last time for the same. Pt also states he was constipated and he didn't take his BP meds for the last three days because he had no appetite and he thought he wasn't supposed to take the medicine without food on his stomach.

## 2018-11-12 NOTE — Discharge Instructions (Signed)
EKG was similar to previous in the past.  No concerns I am still awaiting your lab results.  I will call you this evening with any abnormal results Make sure you are taking your blood pressure every day as scheduled Make sure you are eating to keep your blood sugar up Drink plenty of water You did have a low-grade fever here today.  If there is some sort of viral illness this could cause her symptoms also.

## 2018-11-13 ENCOUNTER — Telehealth (HOSPITAL_COMMUNITY): Payer: Self-pay | Admitting: Emergency Medicine

## 2018-11-13 NOTE — Telephone Encounter (Signed)
Per Dr. Lanny Cramp, pt needs to drink fluids and follow up with PCP next week for recheck of blood work. Attempted to reach patient. No answer at this time. Voicemail full.

## 2018-11-16 NOTE — ED Provider Notes (Signed)
[facility]    CSN: [id number] Arrival date & time: 11/12/18  1518     History   Chief Complaint Chief Complaint  Patient presents with  . Sweating  . Dizziness    HPI Dale Mcintosh is a 33 y.o. male.   Pt is a 33 year old male that presents with sweating and dizziness. This has occurred a few times at work this week. [facility]. Recently tested negative for COVID. I treated him for constipation which has resolved. This problem typically starts when he gets to work. He eats breakfast usually after he arrives to work. He has missed his BP meds over the past 3 days. He has had loss of appetite. No N,V, chest pain, SOB. No cough, congestion, fevers. His work checks his temp. Employer sent him here due to symptoms.   ROS per HPI      Past Medical History:  Diagnosis Date  . Hypertension   . OSA (obstructive sleep apnea)     Patient Active Problem List   Diagnosis Date Noted  . Educated About Covid-19 Virus Infection 09/23/2018  . Acute systolic HF (heart failure) ([facility]) 09/23/2018  . Hypertensive urgency 09/23/2018  . Chest pain 08/28/2018  . Excessive daytime sleepiness 02/01/2017  . Morning headache 02/01/2017  . Morbid obesity ([facility]) 02/01/2017  . Snoring 02/01/2017  . Hypertensive emergency 02/01/2017    Past Surgical History:  Procedure Laterality Date  . NO PAST SURGERIES    . None         Home Medications    Prior to Admission medications   Medication Sig Start Date End Date Taking? Authorizing Provider  aspirin-acetaminophen-caffeine (EXCEDRIN MIGRAINE) [phone] MG tablet Take 2 tablets by mouth every 6 (six) hours as needed for headache.    [provider]  carvedilol (COREG) 25 MG tablet Take 1 tablet (25 mg total) by mouth 2 (two) times daily with a meal. 09/22/18   Rollene RotundaHochrein, James, MD  losartan (COZAAR) 100 MG tablet Take 1 tablet (100 mg total) by mouth daily. 11/02/18   Luevenia MaxinFawze, Mina A,  PA-C  nitroGLYCERIN (NITROSTAT) 0.4 MG SL tablet Place 1 tablet (0.4 mg total) under the tongue every 5 (five) minutes as needed for chest pain (per CT heart protocol). Patient not taking: Reported on 11/02/2018 08/29/18   Glade LloydAlekh, Kshitiz, MD    Family History Family History  Problem Relation Age of Onset  . Hypertension Father     Social History Social History   Tobacco Use  . Smoking status: Current Every Day Smoker    Packs/day: 0.50    Types: Cigarettes  . Smokeless tobacco: Current User  Substance Use Topics  . Alcohol use: Yes    Comment: occ  . Drug use: No     Allergies   Bee venom and Shrimp [shellfish allergy]   Review of Systems Review of Systems   Physical Exam Triage Vital Signs ED Triage Vitals  Enc Vitals Group     BP 11/12/18 1638 117/74     Pulse Rate 11/12/18 1638 91     Resp 11/12/18 1638 18     Temp 11/12/18 1638 99.5 F (37.5 C)     Temp src --      SpO2 11/12/18 1638 98 %     Weight --      Height --      Head Circumference --      Peak Flow --  Pain Score 11/12/18 1639 0     Pain Loc --      Pain Edu? --      Excl. in [place]? --    No data found.  Updated Vital Signs BP 117/74   Pulse 91   Temp 99.5 F (37.5 C)   Resp 18   SpO2 98%   Visual Acuity Right Eye Distance:   Left Eye Distance:   Bilateral Distance:    Right Eye Near:   Left Eye Near:    Bilateral Near:     Physical Exam Vitals signs and nursing note reviewed.  Constitutional:      General: He is not in acute distress.    Appearance: Normal appearance. He is not ill-appearing, toxic-appearing or diaphoretic.  HENT:     Head: Normocephalic and atraumatic.     Nose: Nose normal.     Mouth/Throat:     Pharynx: Oropharynx is clear.  Eyes:     Conjunctiva/sclera: Conjunctivae normal.  Neck:     Musculoskeletal: Normal range of motion.  Cardiovascular:     Rate and Rhythm: Normal rate and regular rhythm.     Pulses: Normal pulses.     Heart sounds:  Normal heart sounds.  Pulmonary:     Effort: Pulmonary effort is normal.     Breath sounds: Normal breath sounds.  Abdominal:     Palpations: Abdomen is soft.     Tenderness: There is no abdominal tenderness.  Musculoskeletal: Normal range of motion.  Skin:    General: Skin is warm and dry.     Findings: No rash.  Neurological:     Mental Status: He is alert.  Psychiatric:        Mood and Affect: Mood normal.      UC Treatments / Results  Labs (all labs ordered are listed, but only abnormal results are displayed) Labs Reviewed  COMPREHENSIVE METABOLIC PANEL - Abnormal; Notable for the following components:      Result Value   Glucose, Bld 103 (*)    Creatinine, Ser 1.91 (*)    GFR calc non Af Amer 45 (*)    GFR calc Af Amer 53 (*)    All other components within normal limits  CBC WITH DIFFERENTIAL/PLATELET    EKG   Radiology No results found.  Procedures Procedures (including critical care time)  Medications Ordered in UC Medications - No data to display  Initial Impression / Assessment and Plan / UC Course  I have reviewed the triage vital signs and the nursing notes.  Pertinent labs & imaging results that were available during my care of the patient were reviewed by me and considered in my medical decision making (see chart for details).     Exam benign Pt has had multiple visits here in the past week EKG similar to previous Lab work with elevated creatinine, slightly more than previous.  Pt called to make aware.  Instructed to stay hydrated, eat small meals to keep blood sugar levels stable.  Recommended taking BP meds daily as prescribed and monitor BP Low grade fever here today. May be viral illness.  Work note given.  Rest, monitor symptoms and if worse go to the ER  Final Clinical Impressions(s) / UC Diagnoses   Final diagnoses:  Dizziness     Discharge Instructions     EKG was similar to previous in the past.  No concerns I am still  awaiting your lab results.  I will call you this  evening with any abnormal results Make sure you are taking your blood pressure every day as scheduled Make sure you are eating to keep your blood sugar up Drink plenty of water You did have a low-grade fever here today.  If there is some sort of viral illness this could cause her symptoms also.    ED Prescriptions    None     Controlled Substance Prescriptions [place] Controlled Substance Registry consulted? Not Applicable   Janace Aris, NP 11/16/18 [phone]

## 2019-02-25 ENCOUNTER — Encounter (HOSPITAL_COMMUNITY): Payer: Self-pay

## 2019-02-25 ENCOUNTER — Other Ambulatory Visit: Payer: Self-pay

## 2019-02-25 ENCOUNTER — Ambulatory Visit (HOSPITAL_COMMUNITY)
Admission: EM | Admit: 2019-02-25 | Discharge: 2019-02-25 | Disposition: A | Discharge status: Home / Self Care | Payer: Self-pay | Attending: Family Medicine | Admitting: Family Medicine

## 2019-02-25 DIAGNOSIS — R361 Hematospermia: Secondary | ICD-10-CM

## 2019-02-25 DIAGNOSIS — I160 Hypertensive urgency: Secondary | ICD-10-CM

## 2019-02-25 LAB — POCT URINALYSIS DIP (DEVICE)
Glucose, UA: NEGATIVE mg/dL
Hgb urine dipstick: NEGATIVE
Ketones, ur: NEGATIVE mg/dL
Leukocytes,Ua: NEGATIVE
Nitrite: NEGATIVE
Protein, ur: 100 mg/dL — AB
Specific Gravity, Urine: >=1.03 (ref 1.005–1.030)
Urobilinogen, UA: 0.2 mg/dL (ref 0.0–1.0)
pH: 5.5 (ref 5.0–8.0)

## 2019-02-25 MED ORDER — LOSARTAN POTASSIUM 100 MG PO TABS: 100.0000 mg | ORAL_TABLET | Freq: Every day | ORAL | 3 refills | Status: DC

## 2019-02-25 MED ORDER — CARVEDILOL 25 MG PO TABS: 25.0000 mg | ORAL_TABLET | Freq: Two times a day (BID) | ORAL | 3 refills | Status: DC

## 2019-02-25 NOTE — Discharge Instructions (Signed)
Take the blood pressure medicine EVERY DAY You need to see a primary care physician on a regular basis Call or return for problems

## 2019-02-25 NOTE — ED Provider Notes (Signed)
[facility]    CSN: [id number] Arrival date & time: 02/25/19  1153      History   Chief Complaint Chief Complaint  Patient presents with  . Bloody Ejaculate  . Hypertension    HPI Dale Mcintosh is a 33 y.o. male.   HPI   33 yo with a long history of intermittent noncompliance with BP medicine and elevated blood pressures with hematuria, elevated creatinine, CHF, OSA in his history.  He currently does not have health insurance and has gone off of all his medicine and not using CPAP.   He is here for hematospermia X 2 in the last week.  No pain or discharge or concern for STD.  no dysuria or frequency or fever.  Denies headache or dizzy spells, no visual symptoms or neuro symptoms.  NO chest pain or SOB or pedal edema.    Past Medical History:  Diagnosis Date  . Hypertension   . OSA (obstructive sleep apnea)     Patient Active Problem List   Diagnosis Date Noted  . Educated about COVID-19 virus infection 09/23/2018  . Acute systolic HF (heart failure) (HCC) 09/23/2018  . Hypertensive urgency 09/23/2018  . Chest pain 08/28/2018  . Excessive daytime sleepiness 02/01/2017  . Morning headache 02/01/2017  . Morbid obesity (HCC) 02/01/2017  . Snoring 02/01/2017  . Hypertensive emergency 02/01/2017    Past Surgical History:  Procedure Laterality Date  . NO PAST SURGERIES    . None         Home Medications    Prior to Admission medications   Medication Sig Start Date End Date Taking? Authorizing Provider  aspirin-acetaminophen-caffeine (EXCEDRIN MIGRAINE) [id number] MG tablet Take 2 tablets by mouth every 6 (six) hours as needed for headache.    [provider]  carvedilol (COREG) 25 MG tablet Take 1 tablet (25 mg total) by mouth 2 (two) times daily with a meal. 02/25/19   Eustace Moore, MD  losartan (COZAAR) 100 MG tablet Take 1 tablet (100 mg total) by mouth daily. 02/25/19   Eustace Moore, MD  nitroGLYCERIN (NITROSTAT) 0.4 MG SL  tablet Place 1 tablet (0.4 mg total) under the tongue every 5 (five) minutes as needed for chest pain (per CT heart protocol). Patient not taking: Reported on 11/02/2018 08/29/18 02/25/19  Glade Lloyd, MD    Family History Family History  Problem Relation Age of Onset  . Hypertension Father     Social History Social History   Tobacco Use  . Smoking status: Current Every Day Smoker    Packs/day: 0.50    Types: Cigarettes  . Smokeless tobacco: Current User  Substance Use Topics  . Alcohol use: Yes    Comment: occ  . Drug use: No     Allergies   Bee venom and Shrimp [shellfish allergy]   Review of Systems Review of Systems  Constitutional: Negative for chills and fever.  HENT: Negative for ear pain and sore throat.   Eyes: Negative for pain and visual disturbance.  Respiratory: Negative for cough and shortness of breath.   Cardiovascular: Negative for chest pain and palpitations.  Gastrointestinal: Negative for abdominal pain and vomiting.  Genitourinary: Negative for dysuria and hematuria.  Musculoskeletal: Negative for arthralgias and back pain.  Skin: Negative for color change and rash.  Neurological: Negative for seizures and syncope.  All other systems reviewed and are negative.    Physical Exam Triage Vital Signs ED Triage Vitals  Enc Vitals Group  BP 02/25/19 1231 (!) 198/139     Pulse Rate 02/25/19 1231 98     Resp 02/25/19 1231 17     Temp 02/25/19 1231 98.8 F (37.1 C)     Temp Source 02/25/19 1231 Oral     SpO2 02/25/19 1231 100 %     Weight --      Height --      Head Circumference --      Peak Flow --      Pain Score 02/25/19 1235 0     Pain Loc --      Pain Edu? --      Excl. in GC? --    No data found.  Updated Vital Signs BP (!) 198/139 (BP Location: Right Arm) Comment: md notified; pt states he has been out bp meds  Pulse 98   Temp 98.8 F (37.1 C) (Oral)   Resp 17   SpO2 100%      Physical Exam Constitutional:       General: He is not in acute distress.    Appearance: He is well-developed. He is obese.  HENT:     Head: Normocephalic and atraumatic.     Mouth/Throat:     Comments: Mask in place Eyes:     Conjunctiva/sclera: Conjunctivae normal.     Pupils: Pupils are equal, round, and reactive to light.  Neck:     Musculoskeletal: Normal range of motion.     Vascular: No carotid bruit.  Cardiovascular:     Rate and Rhythm: Normal rate and regular rhythm.     Heart sounds: Normal heart sounds.  Pulmonary:     Effort: Pulmonary effort is normal. No respiratory distress.     Breath sounds: Normal breath sounds. No wheezing.  Abdominal:     General: There is no distension.     Palpations: Abdomen is soft.  Musculoskeletal: Normal range of motion.     Right lower leg: No edema.     Left lower leg: No edema.  Lymphadenopathy:     Cervical: No cervical adenopathy.  Skin:    General: Skin is warm and dry.  Neurological:     Mental Status: He is alert.  Psychiatric:        Mood and Affect: Mood normal.        Behavior: Behavior normal.      UC Treatments / Results  Labs (all labs ordered are listed, but only abnormal results are displayed) Labs Reviewed  POCT URINALYSIS DIP (DEVICE) - Abnormal; Notable for the following components:      Result Value   Bilirubin Urine SMALL (*)    Protein, ur 100 (*)    All other components within normal limits    EKG   Radiology No results found.  Procedures Procedures (including critical care time)  Medications Ordered in UC Medications - No data to display  Initial Impression / Assessment and Plan / UC Course  I have reviewed the triage vital signs and the nursing notes.  Pertinent labs & imaging results that were available during my care of the patient were reviewed by me and considered in my medical decision making (see chart for details).     Reviewed the consequences of untreated hypertension.  At 87 he already has evidence of heart  disease an kidney damage.  Will refill meds for 3 months until he can get insurance and get in with a new PCP Final Clinical Impressions(s) / UC Diagnoses   Final diagnoses:  Hypertensive urgency  Hematospermia     Discharge Instructions     Take the blood pressure medicine EVERY DAY You need to see a primary care physician on a regular basis Call or return for problems    ED Prescriptions    Medication Sig Dispense Auth. Provider   losartan (COZAAR) 100 MG tablet Take 1 tablet (100 mg total) by mouth daily. 30 tablet Raylene Everts, MD   carvedilol (COREG) 25 MG tablet Take 1 tablet (25 mg total) by mouth 2 (two) times daily with a meal. 60 tablet Raylene Everts, MD     PDMP not reviewed this encounter.   Raylene Everts, MD 02/25/19 1318

## 2019-02-25 NOTE — ED Triage Notes (Signed)
Patient presents to Urgent Care with complaints of blood in his ejaculate after intercourse with his male companion since a few nights ago. Patient reports he has been told in the past that he had HTN but does not have insurance to stay on his medication so tried to regulate bp with diet and exercise.  Pt was told in the past he may have had an STD but was treated for it, pt denies burning with urination but does urinate frequently.

## 2019-04-20 ENCOUNTER — Encounter (HOSPITAL_COMMUNITY): Payer: Self-pay

## 2019-04-20 ENCOUNTER — Other Ambulatory Visit: Payer: Self-pay

## 2019-04-20 ENCOUNTER — Ambulatory Visit (HOSPITAL_COMMUNITY)
Admission: EM | Admit: 2019-04-20 | Discharge: 2019-04-20 | Disposition: A | Discharge status: Home / Self Care | Payer: Self-pay | Attending: Family Medicine | Admitting: Family Medicine

## 2019-04-20 DIAGNOSIS — R351 Nocturia: Secondary | ICD-10-CM

## 2019-04-20 DIAGNOSIS — R109 Unspecified abdominal pain: Secondary | ICD-10-CM

## 2019-04-20 DIAGNOSIS — I10 Essential (primary) hypertension: Secondary | ICD-10-CM

## 2019-04-20 LAB — POCT URINALYSIS DIP (DEVICE)
Glucose, UA: NEGATIVE mg/dL
Hgb urine dipstick: NEGATIVE
Ketones, ur: NEGATIVE mg/dL
Leukocytes,Ua: NEGATIVE
Nitrite: NEGATIVE
Protein, ur: 100 mg/dL — AB
Specific Gravity, Urine: >=1.03 (ref 1.005–1.030)
Urobilinogen, UA: 0.2 mg/dL (ref 0.0–1.0)
pH: 5.5 (ref 5.0–8.0)

## 2019-04-20 LAB — CBG MONITORING, ED
Glucose-Capillary: 86 mg/dL (ref 70–99)
Glucose-Capillary: 86 mg/dL (ref 70–99)

## 2019-04-20 LAB — GLUCOSE, CAPILLARY: Glucose-Capillary: 86 mg/dL (ref 70–99)

## 2019-04-20 MED ORDER — CYCLOBENZAPRINE HCL 10 MG PO TABS: ORAL_TABLET | ORAL | 0 refills | Status: DC

## 2019-04-20 MED ORDER — IBUPROFEN 800 MG PO TABS: 800.0000 mg | ORAL_TABLET | Freq: Three times a day (TID) | ORAL | 0 refills | Status: DC

## 2019-04-20 NOTE — ED Notes (Signed)
177/116 reported to Dr. Mannie Stabile

## 2019-04-20 NOTE — ED Provider Notes (Signed)
[facility]   [id number] 04/20/19 Arrival Time: 1019  ASSESSMENT & PLAN:  1. Right flank pain   2. Nocturia   3. Uncontrolled hypertension     U/A without signs of infection or hematuria. CBG WNL. Suspect muscular etiology of back/flank pain. Able to ambulate here and hemodynamically stable. No indication for imaging of back at this time given no trauma and normal neurological exam. Discussed.  Meds ordered this encounter  Medications  . ibuprofen (ADVIL) 800 MG tablet    Sig: Take 1 tablet (800 mg total) by mouth 3 (three) times daily with meals.    Dispense:  21 tablet    Refill:  0  . cyclobenzaprine (FLEXERIL) 10 MG tablet    Sig: Take 1 tablet by mouth 3 times daily as needed for muscle spasm. Warning: May cause drowsiness.    Dispense:  21 tablet    Refill:  0    Medication sedation precautions given. Encourage ROM/movement as tolerated.  Recommend: Follow-up Information    [facility]   Specialty: Urgent Care Why: If worsening or failing to improve as anticipated. Contact information: [address] [place] [address] [phone]         Recommend f/u with his PCP or cardiologist to discuss BP management.  Reviewed expectations re: course of current medical issues. Questions answered. Outlined signs and symptoms indicating need for more acute intervention. Patient verbalized understanding. After Visit Summary given.   SUBJECTIVE: History from: patient.  Dale Mcintosh is a 33 y.o. male who presents with complaint of intermittent right sided lower back/flank discomfort. Onset gradual. First noted yesterday. Injury/trama: no. History of back problems requiring medical care: rare. Pain described as aching and without radiation. Aggravating factors: certain movements and prolonged walking/standing. Alleviating factors: have not been identified. Progressive LE weakness or saddle anesthesia:  none. Extremity sensation changes or weakness: none. Ambulatory without difficulty. Normal bowel/bladder habits: yes; without urinary retention. Normal PO intake without n/v. No associated abdominal pain/n/v. Self treatment: has has not tried OTC therapies.  Reports no chronic steroid use, fevers, IV drug use, or recent back surgeries or procedures.  Increased blood pressure noted today. Reports that he is treated for hypertension in the past.  He reports taking medications as instructed, no medication side effects noted, no chest pain on exertion, no dyspnea on exertion, no swelling of ankles, no orthostatic dizziness or lightheadedness, no orthopnea or paroxysmal nocturnal dyspnea, no palpitations and no intermittent claudication symptoms.  ROS: As per HPI. All other systems negative.   OBJECTIVE:  Vitals:   04/20/19 1109  BP: (!) 177/116  Pulse: 77  Resp: 18  Temp: 98.2 F (36.8 C)  TempSrc: Oral  SpO2: 100%    General appearance: alert; no distress HEENT: [place]; AT Neck: supple with FROM; without midline tenderness CV: RRR Lungs: unlabored respirations; symmetrical air entry Abdomen: soft, non-tender; non-distended Back: mild  and poorly localized tenderness to palpation over R flank; FROM at waist; bruising: none; without midline tenderness Extremities: without edema; symmetrical without gross deformities; normal ROM of bilateral LE Skin: warm and dry Neurologic: normal gait; normal reflexes of bilateral LE; normal sensation of bilateral LE; normal strength of bilateral LE Psychological: alert and cooperative; normal mood and affect  Labs: Results for orders placed or performed during the hospital encounter of 04/20/19  Glucose, capillary  Result Value Ref Range   Glucose-Capillary 86 70 - 99 mg/dL  POC CBG monitoring  Result Value Ref  Range   Glucose-Capillary 86 70 - 99 mg/dL  POCT urinalysis dip (device)  Result Value Ref Range   Glucose, UA NEGATIVE NEGATIVE mg/dL     Bilirubin Urine SMALL (A) NEGATIVE   Ketones, ur NEGATIVE NEGATIVE mg/dL   Specific Gravity, Urine >=1.030 1.005 - 1.030   Hgb urine dipstick NEGATIVE NEGATIVE   pH 5.5 5.0 - 8.0   Protein, ur 100 (A) NEGATIVE mg/dL   Urobilinogen, UA 0.2 0.0 - 1.0 mg/dL   Nitrite NEGATIVE NEGATIVE   Leukocytes,Ua NEGATIVE NEGATIVE  CBG monitoring, ED  Result Value Ref Range   Glucose-Capillary 86 70 - 99 mg/dL      Allergies  Allergen Reactions  . Bee Venom Anaphylaxis  . Shrimp [Shellfish Allergy] Itching    Past Medical History:  Diagnosis Date  . Hypertension   . OSA (obstructive sleep apnea)    Social History   Socioeconomic History  . Marital status: Significant Other    Spouse name: Not on file  . Number of children: Not on file  . Years of education: Not on file  . Highest education level: Not on file  Occupational History  . Not on file  Tobacco Use  . Smoking status: Current Every Day Smoker    Packs/day: 0.50    Types: Cigarettes  . Smokeless tobacco: Current User  Substance and Sexual Activity  . Alcohol use: Yes    Comment: occ  . Drug use: No  . Sexual activity: Not on file  Other Topics Concern  . Not on file  Social History Narrative  . Not on file   Social Determinants of Health   Financial Resource Strain:   . Difficulty of Paying Living Expenses: Not on file  Food Insecurity:   . Worried About Charity fundraiser in the Last Year: Not on file  . Ran Out of Food in the Last Year: Not on file  Transportation Needs:   . Lack of Transportation (Medical): Not on file  . Lack of Transportation (Non-Medical): Not on file  Physical Activity:   . Days of Exercise per Week: Not on file  . Minutes of Exercise per Session: Not on file  Stress:   . Feeling of Stress : Not on file  Social Connections:   . Frequency of Communication with Friends and Family: Not on file  . Frequency of Social Gatherings with Friends and Family: Not on file  . Attends  Religious Services: Not on file  . Active Member of Clubs or Organizations: Not on file  . Attends Archivist Meetings: Not on file  . Marital Status: Not on file  Intimate Partner Violence:   . Fear of Current or Ex-Partner: Not on file  . Emotionally Abused: Not on file  . Physically Abused: Not on file  . Sexually Abused: Not on file   Family History  Problem Relation Age of Onset  . Hypertension Father    Past Surgical History:  Procedure Laterality Date  . NO PAST SURGERIES    . None       Vanessa Kick, MD 04/20/19 1150

## 2019-04-20 NOTE — ED Triage Notes (Signed)
Pt states he has right side flank pain this started yesterday. Pt states it hurts movement and coughing. Pt states he needs work note.

## 2019-04-20 NOTE — Discharge Instructions (Signed)

## 2019-04-22 ENCOUNTER — Other Ambulatory Visit: Payer: Self-pay

## 2019-04-22 ENCOUNTER — Encounter (HOSPITAL_COMMUNITY): Payer: Self-pay

## 2019-04-22 ENCOUNTER — Ambulatory Visit (HOSPITAL_COMMUNITY)
Admission: EM | Admit: 2019-04-22 | Discharge: 2019-04-22 | Disposition: A | Discharge status: Home / Self Care | Payer: HRSA Program | Attending: Internal Medicine | Admitting: Internal Medicine

## 2019-04-22 DIAGNOSIS — U071 COVID-19: Secondary | ICD-10-CM | POA: Diagnosis not present

## 2019-04-22 LAB — BASIC METABOLIC PANEL
Anion gap: 10 (ref 5–15)
BUN: 15 mg/dL (ref 6–20)
CO2: 27 mmol/L (ref 22–32)
Calcium: 9 mg/dL (ref 8.9–10.3)
Chloride: 101 mmol/L (ref 98–111)
Creatinine, Ser: 1.84 mg/dL — ABNORMAL HIGH (ref 0.61–1.24)
GFR calc Af Amer: 55 mL/min — ABNORMAL LOW (ref >60)
GFR calc non Af Amer: 47 mL/min — ABNORMAL LOW (ref >60)
Glucose, Bld: 101 mg/dL — ABNORMAL HIGH (ref 70–99)
Potassium: 3.5 mmol/L (ref 3.5–5.1)
Sodium: 138 mmol/L (ref 135–145)

## 2019-04-22 LAB — CBC
HCT: 44.9 % (ref 39.0–52.0)
Hemoglobin: 14.3 g/dL (ref 13.0–17.0)
MCH: 27.3 pg (ref 26.0–34.0)
MCHC: 31.8 g/dL (ref 30.0–36.0)
MCV: 85.7 fL (ref 80.0–100.0)
Platelets: 166 10*3/uL (ref 150–400)
RBC: 5.24 MIL/uL (ref 4.22–5.81)
RDW: 14.1 % (ref 11.5–15.5)
WBC: 5.1 10*3/uL (ref 4.0–10.5)
nRBC: 0 % (ref 0.0–0.2)

## 2019-04-22 LAB — POC SARS CORONAVIRUS 2 AG -  ED: SARS Coronavirus 2 Ag: POSITIVE — AB

## 2019-04-22 LAB — POC SARS CORONAVIRUS 2 AG: SARS Coronavirus 2 Ag: POSITIVE — AB

## 2019-04-22 MED ORDER — BENZONATATE 100 MG PO CAPS: 100.0000 mg | ORAL_CAPSULE | Freq: Three times a day (TID) | ORAL | 0 refills | Status: DC

## 2019-04-22 MED ORDER — IBUPROFEN 600 MG PO TABS: 600.0000 mg | ORAL_TABLET | Freq: Three times a day (TID) | ORAL | 0 refills | Status: DC | PRN

## 2019-04-22 MED ORDER — ONDANSETRON HCL 4 MG PO TABS: 4.0000 mg | ORAL_TABLET | Freq: Four times a day (QID) | ORAL | 0 refills | Status: DC

## 2019-04-22 NOTE — ED Triage Notes (Addendum)
Patient presents to Urgent Care with complaints of flank pain since 2 days ago. Patient reports he was seen here for it and was treated for a back spasm. Pt thinks is may be related to quitting smoking.  Pt adds that he has not had a BM in 3 days, has not been eating much the past 3 days either.

## 2019-04-22 NOTE — Discharge Instructions (Signed)
Patient advised to self isolate for 10 days Optimize oral fluid intake Cough medication and nausea medications have been prescribed Continue to follow wearing facemask, hand hygiene and maintaining social distance.

## 2019-04-22 NOTE — ED Provider Notes (Signed)
[facility]    CSN: [id number] Arrival date & time: 04/22/19  1901      History   Chief Complaint Chief Complaint  Patient presents with  . Back Pain    HPI Dale Mcintosh is a 33 y.o. male with a history of hypertension comes to urgent care with complaints of worsening back pain, general malaise of a few days duration.  Patient was seen in the urgent care on 12/15 with flank pain and was managed as a case of musculoskeletal flank pain.  Patient comes in with no improvement in her symptoms.  Pain is actually worse at this time.  Is constant, sharp and throbbing.  He denies any loss of taste or smell.  He has some fatigue.  No nausea, vomiting or diarrhea.  He denies any sick contacts.  Ibuprofen is not relieving his pain.  He denies any trauma or falls.   HPI  Past Medical History:  Diagnosis Date  . Hypertension   . OSA (obstructive sleep apnea)     Patient Active Problem List   Diagnosis Date Noted  . Educated about COVID-19 virus infection 09/23/2018  . Acute systolic HF (heart failure) ([facility]) 09/23/2018  . Hypertensive urgency 09/23/2018  . Chest pain 08/28/2018  . Excessive daytime sleepiness 02/01/2017  . Morning headache 02/01/2017  . Morbid obesity ([place]) 02/01/2017  . Snoring 02/01/2017  . Hypertensive emergency 02/01/2017    Past Surgical History:  Procedure Laterality Date  . NO PAST SURGERIES    . None         Home Medications    Prior to Admission medications   Medication Sig Start Date End Date Taking? Authorizing Provider  cyclobenzaprine (FLEXERIL) 10 MG tablet Take 1 tablet by mouth 3 times daily as needed for muscle spasm. Warning: May cause drowsiness. 04/20/19  Yes Vanessa Kick, MD  aspirin-acetaminophen-caffeine (EXCEDRIN MIGRAINE) [phone] MG tablet Take 2 tablets by mouth every 6 (six) hours as needed for headache.    [provider]  benzonatate (TESSALON) 100 MG capsule Take 1 capsule (100 mg total) by mouth every 8  (eight) hours. 04/22/19   Chase Picket, MD  carvedilol (COREG) 25 MG tablet Take 1 tablet (25 mg total) by mouth 2 (two) times daily with a meal. 02/25/19   Raylene Everts, MD  ibuprofen (ADVIL) 600 MG tablet Take 1 tablet (600 mg total) by mouth every 8 (eight) hours as needed for fever, headache, mild pain or moderate pain. 04/22/19   Chase Picket, MD  losartan (COZAAR) 100 MG tablet Take 1 tablet (100 mg total) by mouth daily. 02/25/19   Raylene Everts, MD  ondansetron (ZOFRAN) 4 MG tablet Take 1 tablet (4 mg total) by mouth every 6 (six) hours. 04/22/19   Catcher Dehoyos, Myrene Galas, MD  nitroGLYCERIN (NITROSTAT) 0.4 MG SL tablet Place 1 tablet (0.4 mg total) under the tongue every 5 (five) minutes as needed for chest pain (per CT heart protocol). Patient not taking: Reported on 11/02/2018 08/29/18 02/25/19  Aline August, MD    Family History Family History  Problem Relation Age of Onset  . Hypertension Father     Social History Social History   Tobacco Use  . Smoking status: Current Every Day Smoker    Packs/day: 0.50    Types: Cigarettes  . Smokeless tobacco: Current User  . Tobacco comment: "quit smoking 04/20/2019"  Substance Use Topics  . Alcohol use: Not Currently    Comment: occ  . Drug use:  No     Allergies   Bee venom and Shrimp [shellfish allergy]   Review of Systems Review of Systems  Constitutional: Positive for activity change and fatigue. Negative for chills and fever.  HENT: Negative.   Respiratory: Positive for cough. Negative for chest tightness and shortness of breath.   Cardiovascular: Negative for chest pain and palpitations.  Gastrointestinal: Negative for nausea and vomiting.  Musculoskeletal: Positive for arthralgias and myalgias. Negative for joint swelling.  Skin: Negative.   Neurological: Negative for dizziness, weakness, light-headedness and headaches.  Psychiatric/Behavioral: Negative for confusion and decreased concentration.      Physical Exam Triage Vital Signs ED Triage Vitals  Enc Vitals Group     BP 04/22/19 1922 (!) 169/101     Pulse Rate 04/22/19 1922 86     Resp 04/22/19 1922 17     Temp 04/22/19 1922 99.6 F (37.6 C)     Temp Source 04/22/19 1922 Oral     SpO2 04/22/19 1922 99 %     Weight --      Height --      Head Circumference --      Peak Flow --      Pain Score 04/22/19 1921 10     Pain Loc --      Pain Edu? --      Excl. in GC? --    No data found.  Updated Vital Signs BP (!) 169/101 (BP Location: Left Arm)   Pulse 86   Temp 99.6 F (37.6 C) (Oral)   Resp 17   SpO2 99%   Visual Acuity Right Eye Distance:   Left Eye Distance:   Bilateral Distance:    Right Eye Near:   Left Eye Near:    Bilateral Near:     Physical Exam Constitutional:      General: He is in acute distress.     Appearance: He is ill-appearing. He is not toxic-appearing or diaphoretic.  HENT:     Right Ear: Tympanic membrane normal.     Left Ear: Tympanic membrane normal.     Mouth/Throat:     Mouth: Mucous membranes are moist.     Pharynx: No oropharyngeal exudate.  Cardiovascular:     Rate and Rhythm: Normal rate and regular rhythm.     Pulses: Normal pulses.     Heart sounds: No murmur.  Pulmonary:     Effort: Pulmonary effort is normal. No respiratory distress.     Breath sounds: Normal breath sounds. No wheezing or rales.  Abdominal:     General: Bowel sounds are normal. There is no distension.     Palpations: There is no mass.     Tenderness: There is no abdominal tenderness.  Musculoskeletal:        General: Tenderness (Over the entire back) present. No swelling or signs of injury.     Left lower leg: No edema.  Skin:    General: Skin is warm.     Capillary Refill: Capillary refill takes less than 2 seconds.     Findings: No bruising or erythema.  Neurological:     General: No focal deficit present.     Mental Status: He is alert and oriented to person, place, and time.       UC Treatments / Results  Labs (all labs ordered are listed, but only abnormal results are displayed) Labs Reviewed  BASIC METABOLIC PANEL - Abnormal; Notable for the following components:      Result Value  Glucose, Bld 101 (*)    Creatinine, Ser 1.84 (*)    GFR calc non Af Amer 47 (*)    GFR calc Af Amer 55 (*)    All other components within normal limits  POC SARS CORONAVIRUS 2 AG -  ED - Abnormal; Notable for the following components:   SARS Coronavirus 2 Ag POSITIVE (*)    All other components within normal limits  POC SARS CORONAVIRUS 2 AG - Abnormal; Notable for the following components:   SARS Coronavirus 2 Ag POSITIVE (*)    All other components within normal limits  CBC    EKG   Radiology No results found.  Procedures Procedures (including critical care time)  Medications Ordered in UC Medications - No data to display  Initial Impression / Assessment and Plan / UC Course  I have reviewed the triage vital signs and the nursing notes.  Pertinent labs & imaging results that were available during my care of the patient were reviewed by me and considered in my medical decision making (see chart for details).     1.  COVID-19 infection: Patient is advised to self isolate per CDC criteria Tessalon Perles as needed for cough Zofran as needed for nausea If patient develops significant shortness of breath he is advised to reach out to the urgent care ideally via video visits or in person to have his clinical needs addressed.  Patient is advised to optimize oral intake. Final Clinical Impressions(s) / UC Diagnoses   Final diagnoses:  COVID-19 virus infection     Discharge Instructions     Patient advised to self isolate for 10 days Optimize oral fluid intake Cough medication and nausea medications have been prescribed Continue to follow wearing facemask, hand hygiene and maintaining social distance.    ED Prescriptions    Medication Sig Dispense  Auth. Provider   benzonatate (TESSALON) 100 MG capsule Take 1 capsule (100 mg total) by mouth every 8 (eight) hours. 21 capsule Chaia Ikard, Britta Mccreedy, MD   ondansetron (ZOFRAN) 4 MG tablet Take 1 tablet (4 mg total) by mouth every 6 (six) hours. 20 tablet Haston Casebolt, Britta Mccreedy, MD   ibuprofen (ADVIL) 600 MG tablet Take 1 tablet (600 mg total) by mouth every 8 (eight) hours as needed for fever, headache, mild pain or moderate pain. 30 tablet Raymona Boss, Britta Mccreedy, MD     PDMP not reviewed this encounter.   Merrilee Jansky, MD 04/25/19 [phone]

## 2019-04-27 ENCOUNTER — Telehealth: Payer: Self-pay | Admitting: Unknown Physician Specialty

## 2019-04-27 NOTE — Telephone Encounter (Signed)
Erroneous call contact

## 2019-04-27 NOTE — Telephone Encounter (Signed)
Mailbox full. Mychart message sent.

## 2019-05-17 ENCOUNTER — Other Ambulatory Visit: Payer: Self-pay

## 2019-05-17 ENCOUNTER — Encounter (HOSPITAL_COMMUNITY): Payer: Self-pay

## 2019-05-17 ENCOUNTER — Ambulatory Visit (HOSPITAL_COMMUNITY)
Admission: EM | Admit: 2019-05-17 | Discharge: 2019-05-17 | Disposition: A | Discharge status: Home / Self Care | Payer: Self-pay | Attending: Internal Medicine | Admitting: Internal Medicine

## 2019-05-17 ENCOUNTER — Ambulatory Visit (INDEPENDENT_AMBULATORY_CARE_PROVIDER_SITE_OTHER): Payer: Self-pay

## 2019-05-17 DIAGNOSIS — R1030 Lower abdominal pain, unspecified: Secondary | ICD-10-CM

## 2019-05-17 DIAGNOSIS — R109 Unspecified abdominal pain: Secondary | ICD-10-CM | POA: Insufficient documentation

## 2019-05-17 LAB — CBC
HCT: 42 % (ref 39.0–52.0)
Hemoglobin: 12.9 g/dL — ABNORMAL LOW (ref 13.0–17.0)
MCH: 27 pg (ref 26.0–34.0)
MCHC: 30.7 g/dL (ref 30.0–36.0)
MCV: 88.1 fL (ref 80.0–100.0)
Platelets: 205 10*3/uL (ref 150–400)
RBC: 4.77 MIL/uL (ref 4.22–5.81)
RDW: 14.9 % (ref 11.5–15.5)
WBC: 11.7 10*3/uL — ABNORMAL HIGH (ref 4.0–10.5)
nRBC: 0 % (ref 0.0–0.2)

## 2019-05-17 LAB — POCT URINALYSIS DIP (DEVICE)
Bilirubin Urine: NEGATIVE
Glucose, UA: NEGATIVE mg/dL
Hgb urine dipstick: NEGATIVE
Ketones, ur: NEGATIVE mg/dL
Leukocytes,Ua: NEGATIVE
Nitrite: NEGATIVE
Protein, ur: 30 mg/dL — AB
Specific Gravity, Urine: >=1.03 (ref 1.005–1.030)
Urobilinogen, UA: 0.2 mg/dL (ref 0.0–1.0)
pH: 5.5 (ref 5.0–8.0)

## 2019-05-17 MED ORDER — KETOROLAC TROMETHAMINE 60 MG/2ML IM SOLN
INTRAMUSCULAR | Status: AC
  Filled 2019-05-17: qty 2

## 2019-05-17 MED ORDER — IBUPROFEN 600 MG PO TABS: 600.0000 mg | ORAL_TABLET | Freq: Four times a day (QID) | ORAL | 0 refills | Status: DC | PRN

## 2019-05-17 MED ORDER — KETOROLAC TROMETHAMINE 60 MG/2ML IM SOLN
60.0000 mg | Freq: Once | INTRAMUSCULAR | Status: AC
  Administered 2019-05-17: 60 mg via INTRAMUSCULAR

## 2019-05-17 NOTE — ED Triage Notes (Signed)
Pt presents with lower abdominal pain that he states radiates down into his testicles X 2 days.

## 2019-05-17 NOTE — ED Provider Notes (Signed)
[facility]    CSN: [id number] Arrival date & time: 05/17/19  1039      History   Chief Complaint Chief Complaint  Patient presents with  . Abdominal Pain    HPI Dale Mcintosh is a 34 y.o. male with history of hypertension comes to urgent care with complaints of lower abdominal pain.  Pain started 3 days ago and its worsened over the course of the 2 days.  Patient describes the pain as sharp.  Pain is currently 10 out of 10.  It is aggravated by movement.  No known relieving factors.  Patient has not tried any over-the-counter medications.  Pain radiates to the testes.  He denies any groin swelling.  No fever or chills.  No nausea or vomiting.  No change in bowel movements.  No heavy lifting, falls or trauma.   HPI  Past Medical History:  Diagnosis Date  . Hypertension   . OSA (obstructive sleep apnea)     Patient Active Problem List   Diagnosis Date Noted  . Educated about COVID-19 virus infection 09/23/2018  . Acute systolic HF (heart failure) (HCC) 09/23/2018  . Hypertensive urgency 09/23/2018  . Chest pain 08/28/2018  . Excessive daytime sleepiness 02/01/2017  . Morning headache 02/01/2017  . Morbid obesity (HCC) 02/01/2017  . Snoring 02/01/2017  . Hypertensive emergency 02/01/2017    Past Surgical History:  Procedure Laterality Date  . NO PAST SURGERIES    . None         Home Medications    Prior to Admission medications   Medication Sig Start Date End Date Taking? Authorizing Provider  aspirin-acetaminophen-caffeine (EXCEDRIN MIGRAINE) [phone] MG tablet Take 2 tablets by mouth every 6 (six) hours as needed for headache.    [provider]  carvedilol (COREG) 25 MG tablet Take 1 tablet (25 mg total) by mouth 2 (two) times daily with a meal. 02/25/19   Eustace Moore, MD  cyclobenzaprine (FLEXERIL) 10 MG tablet Take 1 tablet by mouth 3 times daily as needed for muscle spasm. Warning: May cause drowsiness. 04/20/19   Mardella Layman,  MD  ibuprofen (ADVIL) 600 MG tablet Take 1 tablet (600 mg total) by mouth every 6 (six) hours as needed. 05/17/19   Merrilee Jansky, MD  losartan (COZAAR) 100 MG tablet Take 1 tablet (100 mg total) by mouth daily. 02/25/19   Eustace Moore, MD  ondansetron (ZOFRAN) 4 MG tablet Take 1 tablet (4 mg total) by mouth every 6 (six) hours. 04/22/19   Joana Nolton, Britta Mccreedy, MD  nitroGLYCERIN (NITROSTAT) 0.4 MG SL tablet Place 1 tablet (0.4 mg total) under the tongue every 5 (five) minutes as needed for chest pain (per CT heart protocol). Patient not taking: Reported on 11/02/2018 08/29/18 02/25/19  Glade Lloyd, MD    Family History Family History  Problem Relation Age of Onset  . Hypertension Father     Social History Social History   Tobacco Use  . Smoking status: Current Every Day Smoker    Packs/day: 0.50    Types: Cigarettes  . Smokeless tobacco: Current User  . Tobacco comment: "quit smoking 04/20/2019"  Substance Use Topics  . Alcohol use: Not Currently    Comment: occ  . Drug use: No     Allergies   Bee venom and Shrimp [shellfish allergy]   Review of Systems Review of Systems  Constitutional: Negative for activity change, chills, fatigue and fever.  HENT: Negative.   Respiratory: Negative for cough, shortness of  breath and wheezing.   Cardiovascular: Negative for chest pain and palpitations.  Gastrointestinal: Positive for abdominal pain. Negative for blood in stool, constipation, diarrhea and vomiting.  Genitourinary: Negative for dysuria, flank pain and urgency.  Musculoskeletal: Positive for myalgias. Negative for arthralgias.  Skin: Negative for pallor, rash and wound.  Neurological: Negative for dizziness, weakness, light-headedness and headaches.     Physical Exam Triage Vital Signs ED Triage Vitals  Enc Vitals Group     BP 05/17/19 1133 (!) 171/111     Pulse Rate 05/17/19 1133 92     Resp 05/17/19 1133 20     Temp 05/17/19 1133 98.5 F (36.9 C)      Temp Source 05/17/19 1133 Oral     SpO2 05/17/19 1133 99 %     Weight --      Height --      Head Circumference --      Peak Flow --      Pain Score 05/17/19 1131 10     Pain Loc --      Pain Edu? --      Excl. in [place]? --    No data found.  Updated Vital Signs BP (!) 171/111 Comment: Pt did not take blood pressure medication today  Pulse 92   Temp 98.5 F (36.9 C) (Oral)   Resp 20   SpO2 99%   Visual Acuity Right Eye Distance:   Left Eye Distance:   Bilateral Distance:    Right Eye Near:   Left Eye Near:    Bilateral Near:     Physical Exam Vitals and nursing note reviewed.  Constitutional:      General: He is in acute distress.     Appearance: He is well-developed. He is obese. He is not ill-appearing.  Cardiovascular:     Rate and Rhythm: Normal rate and regular rhythm.     Heart sounds: Normal heart sounds. No murmur. No friction rub.  Pulmonary:     Effort: Pulmonary effort is normal. No respiratory distress.     Breath sounds: No stridor.  Abdominal:     General: Abdomen is protuberant. Bowel sounds are normal. There is no distension. There are no signs of injury.     Palpations: Abdomen is soft. There is no shifting dullness or mass.     Tenderness: There is abdominal tenderness in the suprapubic area. There is no right CVA tenderness or left CVA tenderness.     Hernia: No hernia is present.     Comments: Voluntary guarding.  Tenderness to palpation of the anterior abdominal wall over the suprapubic region.  Groin is negative for inguinal hernia.  Genitourinary:    Testes:        Right: Mass not present.  Skin:    General: Skin is warm.     Capillary Refill: Capillary refill takes less than 2 seconds.     Coloration: Skin is not mottled or pale.  Neurological:     General: No focal deficit present.     Mental Status: He is alert and oriented to person, place, and time.      UC Treatments / Results  Labs (all labs ordered are listed, but only  abnormal results are displayed) Labs Reviewed  CBC - Abnormal; Notable for the following components:      Result Value   WBC 11.7 (*)    Hemoglobin 12.9 (*)    All other components within normal limits  POCT URINALYSIS DIP (DEVICE) -  Abnormal; Notable for the following components:   Protein, ur 30 (*)    All other components within normal limits    EKG   Radiology DG Abd 1 View  Result Date: 05/17/2019 CLINICAL DATA:  Lower abdominal pain for 2 days. COVID-19 positive 04/22/2019. EXAM: ABDOMEN - 1 VIEW COMPARISON:  11/09/2018 abdominal radiograph. FINDINGS: No disproportionately dilated small bowel loops. Mild colonic stool volume. No evidence of pneumatosis or pneumoperitoneum. No radiopaque nephrolithiasis. IMPRESSION: Nonobstructive bowel gas pattern.  Mild colonic stool volume. Electronically Signed   By: Delbert Phenix M.D.   On: 05/17/2019 13:28    Procedures Procedures (including critical care time)  Medications Ordered in UC Medications  ketorolac (TORADOL) injection 60 mg (has no administration in time range)    Initial Impression / Assessment and Plan / UC Course  I have reviewed the triage vital signs and the nursing notes.  Pertinent labs & imaging results that were available during my care of the patient were reviewed by me and considered in my medical decision making (see chart for details).     1.  Anterior abdominal wall pain: KUB is negative for constipation Toradol 60 mg IM x1 dose Ibuprofen 600 mg every 6 hours as needed for pain If patient's abdominal pain worsens he is advised to go to ED for imaging Physical exam is indicated of of anterior abdominal wall pain.  Patient denies rebound tenderness.  Bowel sounds are normal and there is no groin swelling or tenderness.  2.  Hypertensive urgency secondary to abdominal wall pain: Toradol 60 mg IM x1 Ibuprofen 600 mg every 6 hours as needed Patient denies any headaches, shortness of breath or chest  pain. Final Clinical Impressions(s) / UC Diagnoses   Final diagnoses:  Abdominal wall pain   Discharge Instructions   None    ED Prescriptions    Medication Sig Dispense Auth. Provider   ibuprofen (ADVIL) 600 MG tablet Take 1 tablet (600 mg total) by mouth every 6 (six) hours as needed. 30 tablet Daksha Koone, Britta Mccreedy, MD     PDMP not reviewed this encounter.   Merrilee Jansky, MD 05/17/19 1943

## 2019-05-20 ENCOUNTER — Emergency Department (HOSPITAL_COMMUNITY)
Admission: EM | Admit: 2019-05-20 | Discharge: 2019-05-20 | Disposition: A | Discharge status: Home / Self Care | Payer: Self-pay | Attending: Emergency Medicine | Admitting: Emergency Medicine

## 2019-05-20 ENCOUNTER — Other Ambulatory Visit: Payer: Self-pay

## 2019-05-20 ENCOUNTER — Encounter (HOSPITAL_COMMUNITY): Payer: Self-pay | Admitting: Emergency Medicine

## 2019-05-20 ENCOUNTER — Emergency Department (HOSPITAL_COMMUNITY): Payer: Self-pay

## 2019-05-20 DIAGNOSIS — I10 Essential (primary) hypertension: Secondary | ICD-10-CM | POA: Insufficient documentation

## 2019-05-20 DIAGNOSIS — N200 Calculus of kidney: Secondary | ICD-10-CM | POA: Insufficient documentation

## 2019-05-20 DIAGNOSIS — Z79899 Other long term (current) drug therapy: Secondary | ICD-10-CM | POA: Insufficient documentation

## 2019-05-20 DIAGNOSIS — F17210 Nicotine dependence, cigarettes, uncomplicated: Secondary | ICD-10-CM | POA: Insufficient documentation

## 2019-05-20 DIAGNOSIS — K5732 Diverticulitis of large intestine without perforation or abscess without bleeding: Secondary | ICD-10-CM | POA: Insufficient documentation

## 2019-05-20 LAB — CBC WITH DIFFERENTIAL/PLATELET
Abs Immature Granulocytes: 0.04 10*3/uL (ref 0.00–0.07)
Basophils Absolute: 0 10*3/uL (ref 0.0–0.1)
Basophils Relative: 0 %
Eosinophils Absolute: 0.3 10*3/uL (ref 0.0–0.5)
Eosinophils Relative: 3 %
HCT: 39.9 % (ref 39.0–52.0)
Hemoglobin: 12 g/dL — ABNORMAL LOW (ref 13.0–17.0)
Immature Granulocytes: 0 %
Lymphocytes Relative: 19 %
Lymphs Abs: 1.8 10*3/uL (ref 0.7–4.0)
MCH: 27 pg (ref 26.0–34.0)
MCHC: 30.1 g/dL (ref 30.0–36.0)
MCV: 89.9 fL (ref 80.0–100.0)
Monocytes Absolute: 0.9 10*3/uL (ref 0.1–1.0)
Monocytes Relative: 9 %
Neutro Abs: 6.5 10*3/uL (ref 1.7–7.7)
Neutrophils Relative %: 69 %
Platelets: 186 10*3/uL (ref 150–400)
RBC: 4.44 MIL/uL (ref 4.22–5.81)
RDW: 15.1 % (ref 11.5–15.5)
WBC: 9.5 10*3/uL (ref 4.0–10.5)
nRBC: 0 % (ref 0.0–0.2)

## 2019-05-20 LAB — URINALYSIS, ROUTINE W REFLEX MICROSCOPIC
Bacteria, UA: NONE SEEN
Bilirubin Urine: NEGATIVE
Glucose, UA: NEGATIVE mg/dL
Hgb urine dipstick: NEGATIVE
Ketones, ur: NEGATIVE mg/dL
Leukocytes,Ua: NEGATIVE
Nitrite: NEGATIVE
Protein, ur: 30 mg/dL — AB
Specific Gravity, Urine: 1.03 (ref 1.005–1.030)
pH: 5 (ref 5.0–8.0)

## 2019-05-20 LAB — COMPREHENSIVE METABOLIC PANEL
ALT: 26 U/L (ref 0–44)
AST: 23 U/L (ref 15–41)
Albumin: 3.7 g/dL (ref 3.5–5.0)
Alkaline Phosphatase: 58 U/L (ref 38–126)
Anion gap: 12 (ref 5–15)
BUN: 18 mg/dL (ref 6–20)
CO2: 28 mmol/L (ref 22–32)
Calcium: 9.2 mg/dL (ref 8.9–10.3)
Chloride: 101 mmol/L (ref 98–111)
Creatinine, Ser: 1.33 mg/dL — ABNORMAL HIGH (ref 0.61–1.24)
GFR calc Af Amer: >60 mL/min (ref >60)
GFR calc non Af Amer: >60 mL/min (ref >60)
Glucose, Bld: 118 mg/dL — ABNORMAL HIGH (ref 70–99)
Potassium: 3.9 mmol/L (ref 3.5–5.1)
Sodium: 141 mmol/L (ref 135–145)
Total Bilirubin: 0.5 mg/dL (ref 0.3–1.2)
Total Protein: 7.5 g/dL (ref 6.5–8.1)

## 2019-05-20 LAB — LIPASE, BLOOD: Lipase: 27 U/L (ref 11–51)

## 2019-05-20 MED ORDER — CIPROFLOXACIN HCL 500 MG PO TABS: 500.0000 mg | ORAL_TABLET | Freq: Two times a day (BID) | ORAL | 0 refills | Status: AC

## 2019-05-20 MED ORDER — METRONIDAZOLE 500 MG PO TABS: 500.0000 mg | ORAL_TABLET | Freq: Three times a day (TID) | ORAL | 0 refills | Status: AC

## 2019-05-20 MED ORDER — IOHEXOL 300 MG/ML  SOLN
100.0000 mL | Freq: Once | INTRAMUSCULAR | Status: AC | PRN
  Administered 2019-05-20: 100 mL via INTRAVENOUS

## 2019-05-20 MED ORDER — DICYCLOMINE HCL 10 MG/ML IM SOLN
20.0000 mg | Freq: Once | INTRAMUSCULAR | Status: AC
  Administered 2019-05-20: 09:00:00 20 mg via INTRAMUSCULAR
  Filled 2019-05-20: qty 2

## 2019-05-20 NOTE — ED Notes (Signed)
Pt states he took BP medication within the last hour.

## 2019-05-20 NOTE — Discharge Instructions (Signed)
You were seen in the ER for right-sided abdominal pain  Lab work was normal.  Urinalysis was normal.  CT showed sigmoid diverticulitis which is an inflammation/infection of the colon.  This is treated with antibiotics.  Take ciprofloxacin and metronidazole as prescribed and until completed.  I recommend a gentle, clear liquid/pured diet for the next 24 to 48 hours.  Slowly transition back to a gentle diet over the next couple of days as long as her symptoms are improving.  You can take 500 to 1000 mg of acetaminophen (Tylenol) every 6-8 hours for pain as needed.  Diverticulitis can always progress and cause complications like perforation or an abscess.  I recommend re-evaluation by primary care doctor in 72 hours to ensure symptoms are improving. Return to the ER for fever greater than 100.4, worsening or severe abdominal pain, blood in your stool, diarrhea.  CT incidentally found one 2.3 cm kidney stone in one 0.7 cm right kidney stone.  The stones are in the kidney and are not causing any issues or pain right now, they can cause pain and symptoms if they start to move and try to come out.  Given the size of your left kidney stone I recommended follow-up with urology for further discussion of this.

## 2019-05-20 NOTE — ED Provider Notes (Signed)
[facility] Provider Note   CSN: [id number] Arrival date & time: 05/20/19  0734     History Chief Complaint  Patient presents with  . Abdominal Pain    Dale Mcintosh is a 34 y.o. male with history of hypertension presents to the ER for evaluation of abdominal pain.  This began over the weekend.  Described as mild to moderate, intermittent.  Located in the lower abdomen mostly in the right lower and middle suprapubic area.  Reports mild constant pain that gets much worse if he walks, goes over a speed bump in the car, labs, passes gas or urinates.  There is some pain in the lower abdomen that radiates down the testicles when he is passing urine and passing gas.  States last time he had similar pain was when he went to urgent care July 2020 and was told he was constipated.  He was given MiraLAX and things improved.  States he has not had any issues with constipation and typically has 3 bowel movements every day.  Admits that his stools are hard and he has occasional hematochezia.  Went to urgent care this past Monday and had blood work and a KUB and was told everything was normal.  He was given "a shot" and then felt like he was passing more gas.  He took ibuprofen once but it did not help so he stopped taking it.  No other interventions.  No modifying factors.  Denies any fever, nausea, vomiting.  No dysuria, hematuria or urinary frequency or urgency.  No urine retention.  Last BM was yesterday afternoon and normal for him.  No penile discharge.  No concern for STD.  He has been with his male partner monogamous relation for the last 9 years.  Has not had sexual encounter since November 2020. No abdominal or pelvic surgeries.  Of note pt noted to be hypertensive here, reports compliant with Coreg. Usually BP this high at home. Denies headache, CP, sob, peripheral edema.   HPI     Past Medical History:  Diagnosis Date  . Hypertension   . OSA (obstructive  sleep apnea)     Patient Active Problem List   Diagnosis Date Noted  . Educated about COVID-19 virus infection 09/23/2018  . Acute systolic HF (heart failure) ([place]) 09/23/2018  . Hypertensive urgency 09/23/2018  . Chest pain 08/28/2018  . Excessive daytime sleepiness 02/01/2017  . Morning headache 02/01/2017  . Morbid obesity ([place]) 02/01/2017  . Snoring 02/01/2017  . Hypertensive emergency 02/01/2017    Past Surgical History:  Procedure Laterality Date  . NO PAST SURGERIES    . None         Family History  Problem Relation Age of Onset  . Hypertension Father     Social History   Tobacco Use  . Smoking status: Current Every Day Smoker    Packs/day: 0.50    Types: Cigarettes  . Smokeless tobacco: Current User  . Tobacco comment: "quit smoking 04/20/2019"  Substance Use Topics  . Alcohol use: Not Currently    Comment: occ  . Drug use: No    Home Medications Prior to Admission medications   Medication Sig Start Date End Date Taking? Authorizing Provider  aspirin-acetaminophen-caffeine (EXCEDRIN MIGRAINE) [phone] MG tablet Take 2 tablets by mouth every 6 (six) hours as needed for headache.   Yes [provider]  carvedilol (COREG) 25 MG tablet Take 1 tablet (25 mg total) by mouth 2 (two) times daily with  a meal. 02/25/19  Yes Eustace Moore, MD  losartan (COZAAR) 100 MG tablet Take 1 tablet (100 mg total) by mouth daily. 02/25/19  Yes Eustace Moore, MD  ciprofloxacin (CIPRO) 500 MG tablet Take 1 tablet (500 mg total) by mouth every 12 (twelve) hours for 10 days. 05/20/19 05/30/19  Liberty Handy, PA-C  cyclobenzaprine (FLEXERIL) 10 MG tablet Take 1 tablet by mouth 3 times daily as needed for muscle spasm. Warning: May cause drowsiness. Patient not taking: Reported on 05/20/2019 04/20/19   Mardella Layman, MD  ibuprofen (ADVIL) 600 MG tablet Take 1 tablet (600 mg total) by mouth every 6 (six) hours as needed. Patient not taking: Reported on 05/20/2019  05/17/19   Merrilee Jansky, MD  metroNIDAZOLE (FLAGYL) 500 MG tablet Take 1 tablet (500 mg total) by mouth 3 (three) times daily for 10 days. 05/20/19 05/30/19  Liberty Handy, PA-C  ondansetron (ZOFRAN) 4 MG tablet Take 1 tablet (4 mg total) by mouth every 6 (six) hours. Patient not taking: Reported on 05/20/2019 04/22/19   Merrilee Jansky, MD  nitroGLYCERIN (NITROSTAT) 0.4 MG SL tablet Place 1 tablet (0.4 mg total) under the tongue every 5 (five) minutes as needed for chest pain (per CT heart protocol). Patient not taking: Reported on 11/02/2018 08/29/18 02/25/19  Glade Lloyd, MD    Allergies    Bee venom and Shrimp [shellfish allergy]  Review of Systems   Review of Systems  Gastrointestinal: Positive for abdominal pain.  Genitourinary: Positive for testicular pain.  All other systems reviewed and are negative.   Physical Exam Updated Vital Signs BP (!) 170/117 (BP Location: Right Arm)   Pulse 79   Temp 98.4 F (36.9 C) (Oral)   Resp 18   Ht 5' 10.5" (1.791 m)   SpO2 99%   BMI 39.61 kg/m   Physical Exam Vitals and nursing note reviewed.  Constitutional:      Appearance: He is well-developed.     Comments: Non toxic.  HENT:     Head: Normocephalic and atraumatic.     Nose: Nose normal.  Eyes:     Conjunctiva/sclera: Conjunctivae normal.  Cardiovascular:     Rate and Rhythm: Normal rate and regular rhythm.     Heart sounds: Normal heart sounds.  Pulmonary:     Effort: Pulmonary effort is normal.     Breath sounds: Normal breath sounds.  Abdominal:     General: Bowel sounds are normal.     Palpations: Abdomen is soft.     Tenderness: There is abdominal tenderness in the right lower quadrant and suprapubic area. Positive signs include McBurney's sign.     Comments: Soft abdomen, obese. Unable to auscultate BS.  No G/R/R. No suprapubic or CVA tenderness. Negative Murphy's.   Genitourinary:    Comments: GU exam performed with EMT at bedside.  No lower/pelvic  tenderness.  No inguinal crease tenderness, fullness or obvious hernias.  External genitalia is normal without lesions.  No meatus discharge.  Nontender testicles, no edema.   Musculoskeletal:        General: Normal range of motion.     Cervical back: Normal range of motion.  Skin:    General: Skin is warm and dry.     Capillary Refill: Capillary refill takes less than 2 seconds.  Neurological:     Mental Status: He is alert.  Psychiatric:        Behavior: Behavior normal.     ED Results / Procedures /  Treatments   Labs (all labs ordered are listed, but only abnormal results are displayed) Labs Reviewed  CBC WITH DIFFERENTIAL/PLATELET - Abnormal; Notable for the following components:      Result Value   Hemoglobin 12.0 (*)    All other components within normal limits  COMPREHENSIVE METABOLIC PANEL - Abnormal; Notable for the following components:   Glucose, Bld 118 (*)    Creatinine, Ser 1.33 (*)    All other components within normal limits  URINALYSIS, ROUTINE W REFLEX MICROSCOPIC - Abnormal; Notable for the following components:   APPearance HAZY (*)    Protein, ur 30 (*)    All other components within normal limits  LIPASE, BLOOD    EKG None  Radiology CT ABDOMEN PELVIS W CONTRAST  Result Date: 05/20/2019 CLINICAL DATA:  RIGHT LOWER QUADRANT suprapubic tenderness for 1 week. Pain with passing gas. Urinating with pain. Pain radiates into the testicle. Symptoms since the weekend. EXAM: CT ABDOMEN AND PELVIS WITH CONTRAST TECHNIQUE: Multidetector CT imaging of the abdomen and pelvis was performed using the standard protocol following bolus administration of intravenous contrast. CONTRAST:  OMNIPAQUE IOHEXOL 300 MG/ML  SOLN COMPARISON:  One-view abdomen on 05/17/2019 FINDINGS: Lower chest: Heart is enlarged. Lung bases are unremarkable. Hepatobiliary: Small low-attenuation probable cyst is identified in the LEFT hepatic lobe measuring 1.1 centimeters. Gallbladder is  present and contracted. Pancreas: Unremarkable. No pancreatic ductal dilatation or surrounding inflammatory changes. Spleen: Normal in size without focal abnormality. Adrenals/Urinary Tract: Adrenal glands are normal in appearance. Ate midpole cyst in the LEFT kidney is 2.3 centimeters. LOWER pole cyst in the RIGHT kidney is 0.7 centimeters. There is no hydronephrosis. Ureters are unremarkable. Bladder is decompressed and otherwise normal in appearance. Stomach/Bowel: Stomach is normal in appearance. Small bowel loops are unremarkable. The appendix is well seen and has a normal appearance. There is significant inflammatory change in the sigmoid mesocolon. The wall of the sigmoid colon is thickened and irregular. Scattered diverticula are identified within this segment. No evidence for perforation or abscess. Vascular/Lymphatic: No significant vascular findings are present. No enlarged abdominal or pelvic lymph nodes. Reproductive: Prostate is unremarkable. Other: No abdominal wall hernia or abnormality. No abdominopelvic ascites. Musculoskeletal: No acute or significant osseous findings. IMPRESSION: 1. Findings are consistent with acute sigmoid diverticulitis. No evidence for perforation or abscess. 2. Normal appendix. 3. Hepatic and renal cysts. 4. Cardiomegaly. Electronically Signed   By: Norva Pavlov M.D.   On: 05/20/2019 10:38    Procedures Procedures (including critical care time)  Medications Ordered in ED Medications  dicyclomine (BENTYL) injection 20 mg (20 mg Intramuscular Given 05/20/19 0927)  iohexol (OMNIPAQUE) 300 MG/ML solution 100 mL (100 mLs Intravenous Contrast Given 05/20/19 1021)    ED Course  I have reviewed the triage vital signs and the nursing notes.  Pertinent labs & imaging results that were available during my care of the patient were reviewed by me and considered in my medical decision making (see chart for details).  Clinical Course as of May 19 1126  Thu May 20, 2019   4098 Creatinine(!): 1.33 [CG]  1191 Protein(!): 30 [CG]    Clinical Course User Index [CG] Jerrell Mylar   MDM Rules/Calculators/A&P                      EMR.  reviewed.  Urgent care visit reviewed, KUB, CBC and urinalysis at that time were reassuring.  Mild stool in colon noted on x-ray.  Patient  presents today for continued, worsening lower abdominal pain.  Has radiating pain in the testicles with passing gas, urination.  Exam reveals generalized lower abdominal tenderness, most significant in the right lower quadrant, suprapubic areas.  No CVA tenderness.  DDX is constipation related pain.  He has no fever and symptoms have been ongoing for almost 1 week, appendicitis is considered but probably unlikely.  No diarrhea, blood in stool diverticulitis also less likely.  GU exam is benign, no inguinal hernias, testicular tenderness.  He is not concerned about STD and is in a monogamous relationship.  Testicular torsion, epididymitis highly unlikely as well.  Bentyl IM, will reassess.   3382: Labs are vastly reassuring.  Urinalysis with 30 ketones and hazy but no RBCs, leukocytes, nitrites or other signs of infection.  Given continued pain despite ibuprofen at home, mostly localized tenderness in the right lower quadrant reasonable to obtain a CT to evaluate for right-sided diverticulitis, appendicitis.  He has no RBCs in urine, urinary symptoms and kidney etiology is less likely.  1125: CT personally reviewed, shows sigmoid diverticulitis without complication.  Patient reevaluated and has very mild tenderness.  He is appropriate for outpatient antibiotics for diverticulitis.  Discussed this plan with patient who is comfortable with this.  Recommended gentle clear diet until symptoms improve and PCP reevaluation in 72 hours.  CT incidentally found bilaterally 2.3 cm L, recommended urology f/u given size. Pt in agreement with POC. Pt noted to be hypertensive, compliant with med but  asymptomatic.  States BP usually this high, creatinine normal.   No s/s of hypertensive crisis. Recommended PCP f/u. Return precautions discussed.   Final Clinical Impression(s) / ED Diagnoses Final diagnoses:  Sigmoid diverticulitis  Left nephrolithiasis    Rx / DC Orders ED Discharge Orders         Ordered    metroNIDAZOLE (FLAGYL) 500 MG tablet  3 times daily     05/20/19 1121    ciprofloxacin (CIPRO) 500 MG tablet  Every 12 hours     05/20/19 1121           Jerrell Mylar 05/20/19 1128    Gwyneth Sprout, MD 05/20/19 1544

## 2019-05-20 NOTE — ED Triage Notes (Signed)
C/o lower abd pain that radiates to testicles since "the weekend".  States he was seen at [facility] on Monday for same.  Currently denies pain unless he laughs or drives over a bump.  Denies nausea, vomiting, and diarrhea.

## 2019-09-16 ENCOUNTER — Other Ambulatory Visit: Payer: Self-pay

## 2019-09-16 ENCOUNTER — Emergency Department (HOSPITAL_COMMUNITY)
Admission: EM | Admit: 2019-09-16 | Discharge: 2019-09-16 | Disposition: A | Discharge status: Home / Self Care | Payer: Self-pay | Attending: Emergency Medicine | Admitting: Emergency Medicine

## 2019-09-16 ENCOUNTER — Ambulatory Visit (HOSPITAL_COMMUNITY)
Admission: EM | Admit: 2019-09-16 | Discharge: 2019-09-16 | Disposition: A | Discharge status: Home / Self Care | Payer: Self-pay | Attending: Physician Assistant | Admitting: Physician Assistant

## 2019-09-16 ENCOUNTER — Emergency Department (HOSPITAL_COMMUNITY): Payer: Self-pay

## 2019-09-16 ENCOUNTER — Encounter (HOSPITAL_COMMUNITY): Payer: Self-pay

## 2019-09-16 DIAGNOSIS — I161 Hypertensive emergency: Secondary | ICD-10-CM

## 2019-09-16 DIAGNOSIS — R109 Unspecified abdominal pain: Secondary | ICD-10-CM

## 2019-09-16 DIAGNOSIS — F17210 Nicotine dependence, cigarettes, uncomplicated: Secondary | ICD-10-CM | POA: Insufficient documentation

## 2019-09-16 DIAGNOSIS — R319 Hematuria, unspecified: Secondary | ICD-10-CM

## 2019-09-16 DIAGNOSIS — R42 Dizziness and giddiness: Secondary | ICD-10-CM

## 2019-09-16 DIAGNOSIS — I10 Essential (primary) hypertension: Secondary | ICD-10-CM | POA: Insufficient documentation

## 2019-09-16 DIAGNOSIS — Z8679 Personal history of other diseases of the circulatory system: Secondary | ICD-10-CM

## 2019-09-16 LAB — CBC
HCT: 47.7 % (ref 39.0–52.0)
Hemoglobin: 14.7 g/dL (ref 13.0–17.0)
MCH: 27.7 pg (ref 26.0–34.0)
MCHC: 30.8 g/dL (ref 30.0–36.0)
MCV: 90 fL (ref 80.0–100.0)
Platelets: 169 10*3/uL (ref 150–400)
RBC: 5.3 MIL/uL (ref 4.22–5.81)
RDW: 14.7 % (ref 11.5–15.5)
WBC: 9.6 10*3/uL (ref 4.0–10.5)
nRBC: 0 % (ref 0.0–0.2)

## 2019-09-16 LAB — BASIC METABOLIC PANEL
Anion gap: 12 (ref 5–15)
BUN: 20 mg/dL (ref 6–20)
CO2: 26 mmol/L (ref 22–32)
Calcium: 9.6 mg/dL (ref 8.9–10.3)
Chloride: 101 mmol/L (ref 98–111)
Creatinine, Ser: 1.3 mg/dL — ABNORMAL HIGH (ref 0.61–1.24)
GFR calc Af Amer: >60 mL/min (ref >60)
GFR calc non Af Amer: >60 mL/min (ref >60)
Glucose, Bld: 104 mg/dL — ABNORMAL HIGH (ref 70–99)
Potassium: 3.7 mmol/L (ref 3.5–5.1)
Sodium: 139 mmol/L (ref 135–145)

## 2019-09-16 LAB — POCT URINALYSIS DIP (DEVICE)
Bilirubin Urine: NEGATIVE
Glucose, UA: NEGATIVE mg/dL
Hgb urine dipstick: NEGATIVE
Ketones, ur: NEGATIVE mg/dL
Leukocytes,Ua: NEGATIVE
Nitrite: NEGATIVE
Protein, ur: 100 mg/dL — AB
Specific Gravity, Urine: >=1.03 (ref 1.005–1.030)
Urobilinogen, UA: 0.2 mg/dL (ref 0.0–1.0)
pH: 6 (ref 5.0–8.0)

## 2019-09-16 LAB — TROPONIN I (HIGH SENSITIVITY)
Troponin I (High Sensitivity): 63 ng/L — ABNORMAL HIGH (ref <18)
Troponin I (High Sensitivity): 54 ng/L — ABNORMAL HIGH (ref <18)

## 2019-09-16 MED ORDER — LOSARTAN POTASSIUM 100 MG PO TABS: 100.0000 mg | ORAL_TABLET | Freq: Every day | ORAL | 1 refills | Status: DC

## 2019-09-16 MED ORDER — SODIUM CHLORIDE 0.9% FLUSH: 3.0000 mL | Freq: Once | INTRAVENOUS | Status: DC

## 2019-09-16 MED ORDER — HYDROMORPHONE HCL 1 MG/ML IJ SOLN
0.5000 mg | Freq: Once | INTRAMUSCULAR | Status: AC
  Administered 2019-09-16: 0.5 mg via INTRAVENOUS
  Filled 2019-09-16: qty 1

## 2019-09-16 MED ORDER — CARVEDILOL 12.5 MG PO TABS
25.0000 mg | ORAL_TABLET | Freq: Once | ORAL | Status: AC
  Administered 2019-09-16: 25 mg via ORAL
  Filled 2019-09-16: qty 2

## 2019-09-16 MED ORDER — CARVEDILOL 25 MG PO TABS: 25.0000 mg | ORAL_TABLET | Freq: Two times a day (BID) | ORAL | 1 refills | Status: DC

## 2019-09-16 MED ORDER — ONDANSETRON HCL 4 MG/2ML IJ SOLN
4.0000 mg | Freq: Once | INTRAMUSCULAR | Status: AC
  Administered 2019-09-16: 4 mg via INTRAVENOUS
  Filled 2019-09-16: qty 2

## 2019-09-16 MED ORDER — ASPIRIN 81 MG PO CHEW
CHEWABLE_TABLET | ORAL | Status: AC
  Filled 2019-09-16: qty 4

## 2019-09-16 MED ORDER — ASPIRIN 81 MG PO CHEW
324.0000 mg | CHEWABLE_TABLET | Freq: Once | ORAL | Status: AC
  Administered 2019-09-16: 324 mg via ORAL

## 2019-09-16 MED ORDER — LOSARTAN POTASSIUM 50 MG PO TABS
100.0000 mg | ORAL_TABLET | Freq: Once | ORAL | Status: AC
  Administered 2019-09-16: 100 mg via ORAL
  Filled 2019-09-16: qty 2

## 2019-09-16 NOTE — ED Notes (Signed)
911 was called. Cardiac monitor was asked for. Pt is stable; oriented in place person and time. IV was started, 324 mg chewable  aspirin given to the pt.  ED nurse in charge was called and pt status were give it.

## 2019-09-16 NOTE — ED Notes (Signed)
Pt discharge instructions reviewed with the patient. The patient verbalized understanding. Pt discharged. 

## 2019-09-16 NOTE — Discharge Instructions (Addendum)
Resume your blood pressure medicine both Coreg and the Cozaar.  Make an appointment to follow-up with wellness clinic for blood pressure follow-up.  Your urine from earlier today did not show any blood in it.  Work note provided.  Return for any new or worse symptoms.

## 2019-09-16 NOTE — ED Provider Notes (Signed)
[facility]   MRN: [id number] DOB: 01-28-1986  Subjective:   Dale Mcintosh is a 34 y.o. male presenting for 1 day history of acute onset moderate abdominal pain improved today.  Patient started to have dizziness this morning and noticed blood in his urine.  He has a history of hypertension and states he has been out of his medication for a week.  Has a history of heart failure, patient states that he has never been told about this.  Denies headache, confusion, weakness, shortness of breath, heart racing, nausea, vomiting, diaphoresis, lower leg swelling.  Patient is a smoker, smokes 1/2ppd.   No current facility-administered medications for this encounter.  Current Outpatient Medications:  .  aspirin-acetaminophen-caffeine (EXCEDRIN MIGRAINE) 250-250-65 MG tablet, Take 2 tablets by mouth every 6 (six) hours as needed for headache., Disp: , Rfl:  .  carvedilol (COREG) 25 MG tablet, Take 1 tablet (25 mg total) by mouth 2 (two) times daily with a meal., Disp: 60 tablet, Rfl: 3 .  cyclobenzaprine (FLEXERIL) 10 MG tablet, Take 1 tablet by mouth 3 times daily as needed for muscle spasm. Warning: May cause drowsiness. (Patient not taking: Reported on 05/20/2019), Disp: 21 tablet, Rfl: 0 .  ibuprofen (ADVIL) 600 MG tablet, Take 1 tablet (600 mg total) by mouth every 6 (six) hours as needed. (Patient not taking: Reported on 05/20/2019), Disp: 30 tablet, Rfl: 0 .  losartan (COZAAR) 100 MG tablet, Take 1 tablet (100 mg total) by mouth daily., Disp: 30 tablet, Rfl: 3 .  ondansetron (ZOFRAN) 4 MG tablet, Take 1 tablet (4 mg total) by mouth every 6 (six) hours. (Patient not taking: Reported on 05/20/2019), Disp: 20 tablet, Rfl: 0   Allergies  Allergen Reactions  . Bee Venom Anaphylaxis  . Shrimp [Shellfish Allergy] Itching    Past Medical History:  Diagnosis Date  . Hypertension   . OSA (obstructive sleep apnea)      Past Surgical History:  Procedure Laterality Date  . NO PAST SURGERIES      . None      Family History  Problem Relation Age of Onset  . Hypertension Father     Social History   Tobacco Use  . Smoking status: Current Every Day Smoker    Packs/day: 0.50    Types: Cigarettes  . Smokeless tobacco: Current User  . Tobacco comment: "quit smoking 04/20/2019"  Substance Use Topics  . Alcohol use: Not Currently    Comment: occ  . Drug use: No    ROS   Objective:   Vitals: BP (!) 213/136 (BP Location: Right Arm)   Pulse 87   Temp 99 F (37.2 C) (Oral)   Resp 20   SpO2 97%   Physical Exam Constitutional:      General: He is not in acute distress.    Appearance: Normal appearance. He is well-developed. He is obese. He is not ill-appearing, toxic-appearing or diaphoretic.  HENT:     Head: Normocephalic and atraumatic.     Right Ear: External ear normal.     Left Ear: External ear normal.     Nose: Nose normal.     Mouth/Throat:     Pharynx: Oropharynx is clear.  Eyes:     General: No scleral icterus.       Right eye: No discharge.        Left eye: No discharge.     Extraocular Movements: Extraocular movements intact.     Pupils: Pupils are equal, round, and reactive  to light.  Cardiovascular:     Rate and Rhythm: Normal rate and regular rhythm.     Heart sounds: No murmur. No friction rub. No gallop.   Pulmonary:     Effort: Pulmonary effort is normal. No respiratory distress.     Breath sounds: No wheezing or rales.  Abdominal:     General: Bowel sounds are normal. There is no distension.     Palpations: Abdomen is soft. There is no mass.     Tenderness: There is abdominal tenderness (upper abdomen). There is no guarding or rebound.  Musculoskeletal:     Cervical back: Normal range of motion.     Right lower leg: No edema.     Left lower leg: No edema.  Skin:    General: Skin is warm and dry.  Neurological:     Mental Status: He is alert and oriented to person, place, and time.     Cranial Nerves: No cranial nerve deficit.      Motor: No weakness.     Coordination: Coordination normal.     Gait: Gait normal.     Deep Tendon Reflexes: Reflexes normal.  Psychiatric:        Mood and Affect: Mood normal.        Behavior: Behavior normal.        Thought Content: Thought content normal.        Judgment: Judgment normal.     Results for orders placed or performed during the hospital encounter of 09/16/19 (from the past 24 hour(s))  POCT urinalysis dip (device)     Status: Abnormal   Collection Time: 09/16/19  9:30 AM  Result Value Ref Range   Glucose, UA NEGATIVE NEGATIVE mg/dL   Bilirubin Urine NEGATIVE NEGATIVE   Ketones, ur NEGATIVE NEGATIVE mg/dL   Specific Gravity, Urine >=1.030 1.005 - 1.030   Hgb urine dipstick NEGATIVE NEGATIVE   pH 6.0 5.0 - 8.0   Protein, ur 100 (A) NEGATIVE mg/dL   Urobilinogen, UA 0.2 0.0 - 1.0 mg/dL   Nitrite NEGATIVE NEGATIVE   Leukocytes,Ua NEGATIVE NEGATIVE   ED ECG REPORT   Date: 09/16/2019  Rate: 83bpm  Rhythm: normal sinus rhythm  QRS Axis: normal  Intervals: normal  ST/T Wave abnormalities: nonspecific T wave changes  Conduction Disutrbances:none  Narrative Interpretation: Sinus rhythm at 83 bpm with T wave inversion in 1, aVL and V5, V6 starkly different from previous EKG in said areas.  Old EKG Reviewed: changes noted  I have personally reviewed the EKG tracing and agree with the computerized printout as noted.  Patient given 324 mg chewable aspirin, IV line established by RN Alana.   Assessment and Plan :   PDMP not reviewed this encounter.  1. Dizziness   2. Hematuria, unspecified type   3. Hypertensive emergency   4. Abdominal pain, unspecified abdominal location   5. History of heart failure     Patient sent by EMS for hypertensive emergency to rule out of ACS, stroke given his dizziness.  EKG changes noted above. Patient transported by EMS promptly.    Jaynee Eagles, PA-C 09/16/19 1002

## 2019-09-16 NOTE — ED Triage Notes (Signed)
Pt came in from urgent care via EMS; reported patient is being seen for hematuria; and showed some changes in EKG; ST invertion; EMS reported patient is hypertensive and patient stated has not his BP meds.

## 2019-09-16 NOTE — ED Provider Notes (Signed)
[facility] EMERGENCY DEPARTMENT Provider Note   CSN: [id number] Arrival date & time: 09/16/19  1021     History Chief Complaint  Patient presents with  . hematuria/HTN    Dale Mcintosh is a 34 y.o. male.  Patient with known history of hypertension.  Has not been very compliant.  Been out of his meds.  Went to urgent care today.  He says when his pressures get high he sees some blood in his urine.  Patient had seen that.  But urine check at the urgent care was negative for blood.  They also saw some EKG changes with some ST segment inversion.  Sent here for further evaluation.  Patient without any chest pain.  Without any shortness of breath.  Patient used to be on Cozaar and Coreg.        Past Medical History:  Diagnosis Date  . Hypertension   . OSA (obstructive sleep apnea)     Patient Active Problem List   Diagnosis Date Noted  . Educated about COVID-19 virus infection 09/23/2018  . Acute systolic HF (heart failure) (HCC) 09/23/2018  . Hypertensive urgency 09/23/2018  . Chest pain 08/28/2018  . Excessive daytime sleepiness 02/01/2017  . Morning headache 02/01/2017  . Morbid obesity (HCC) 02/01/2017  . Snoring 02/01/2017  . Hypertensive emergency 02/01/2017    Past Surgical History:  Procedure Laterality Date  . NO PAST SURGERIES    . None         Family History  Problem Relation Age of Onset  . Hypertension Father     Social History   Tobacco Use  . Smoking status: Current Every Day Smoker    Packs/day: 0.50    Types: Cigarettes  . Smokeless tobacco: Current User  . Tobacco comment: "quit smoking 04/20/2019"  Substance Use Topics  . Alcohol use: Not Currently    Comment: occ  . Drug use: No    Home Medications Prior to Admission medications   Medication Sig Start Date End Date Taking? Authorizing Provider  Acetaminophen-Aspirin Buffered (EXCEDRIN BACK & BODY PO) Take 1 tablet by mouth every 6 (six) hours.   Yes [provider]  carvedilol (COREG) 25 MG tablet Take 1 tablet (25 mg total) by mouth 2 (two) times daily with a meal. 02/25/19  Yes Eustace Moore, MD  losartan (COZAAR) 100 MG tablet Take 1 tablet (100 mg total) by mouth daily. 02/25/19  Yes Eustace Moore, MD  carvedilol (COREG) 25 MG tablet Take 1 tablet (25 mg total) by mouth 2 (two) times daily with a meal. 09/16/19   Vanetta Mulders, MD  losartan (COZAAR) 100 MG tablet Take 1 tablet (100 mg total) by mouth daily. 09/16/19   Vanetta Mulders, MD  nitroGLYCERIN (NITROSTAT) 0.4 MG SL tablet Place 1 tablet (0.4 mg total) under the tongue every 5 (five) minutes as needed for chest pain (per CT heart protocol). Patient not taking: Reported on 11/02/2018 08/29/18 02/25/19  Glade Lloyd, MD    Allergies    Bee venom and Shrimp [shellfish allergy]  Review of Systems   Review of Systems  Constitutional: Negative for chills and fever.  HENT: Negative for congestion, rhinorrhea and sore throat.   Eyes: Negative for visual disturbance.  Respiratory: Negative for cough and shortness of breath.   Cardiovascular: Negative for chest pain and leg swelling.  Gastrointestinal: Negative for abdominal pain, diarrhea, nausea and vomiting.  Genitourinary: Positive for hematuria. Negative for dysuria.  Musculoskeletal: Negative for back pain  and neck pain.  Skin: Negative for rash.  Neurological: Negative for dizziness, light-headedness and headaches.  Hematological: Does not bruise/bleed easily.  Psychiatric/Behavioral: Negative for confusion.    Physical Exam Updated Vital Signs BP (!) 157/122   Pulse 69   Temp 99.2 F (37.3 C) (Oral)   Resp 20   Ht 1.803 m (5\' 11" )   Wt (!) 139.3 kg   SpO2 97%   BMI 42.82 kg/m   Physical Exam Vitals and nursing note reviewed.  Constitutional:      Appearance: Normal appearance. He is well-developed.  HENT:     Head: Normocephalic and atraumatic.  Eyes:     Extraocular Movements: Extraocular  movements intact.     Conjunctiva/sclera: Conjunctivae normal.     Pupils: Pupils are equal, round, and reactive to light.  Cardiovascular:     Rate and Rhythm: Normal rate and regular rhythm.     Heart sounds: No murmur.  Pulmonary:     Effort: Pulmonary effort is normal. No respiratory distress.     Breath sounds: Normal breath sounds.  Abdominal:     Palpations: Abdomen is soft.     Tenderness: There is no abdominal tenderness.  Musculoskeletal:        General: No swelling. Normal range of motion.     Cervical back: Normal range of motion and neck supple.  Skin:    General: Skin is warm and dry.     Capillary Refill: Capillary refill takes less than 2 seconds.  Neurological:     General: No focal deficit present.     Mental Status: He is alert and oriented to person, place, and time.     ED Results / Procedures / Treatments   Labs (all labs ordered are listed, but only abnormal results are displayed) Labs Reviewed  BASIC METABOLIC PANEL - Abnormal; Notable for the following components:      Result Value   Glucose, Bld 104 (*)    Creatinine, Ser 1.30 (*)    All other components within normal limits  TROPONIN I (HIGH SENSITIVITY) - Abnormal; Notable for the following components:   Troponin I (High Sensitivity) 63 (*)    All other components within normal limits  TROPONIN I (HIGH SENSITIVITY) - Abnormal; Notable for the following components:   Troponin I (High Sensitivity) 54 (*)    All other components within normal limits  CBC    EKG EKG Interpretation  Date/Time:  Thursday Sep 16 2019 10:21:21 EDT Ventricular Rate:  78 PR Interval:  166 QRS Duration: 94 QT Interval:  392 QTC Calculation: 446 R Axis:   11 Text Interpretation: Normal sinus rhythm ST & T wave abnormality, consider lateral ischemia Abnormal ECG Confirmed by 02-26[birth year]1993 [id number]) on 09/16/2019 11:37:52 AM   Radiology DG Chest 2 View  Result Date: 09/16/2019 CLINICAL DATA:  EKG changes.  Hypertension. Dizziness. EXAM: CHEST - 2 VIEW COMPARISON:  08/28/2018 FINDINGS: Moderate enlargement of the cardiac silhouette and pulmonary vascular congestion are similar to the prior study. No confluent airspace opacity, pleural effusion, or pneumothorax is identified. No acute osseous abnormality is seen. IMPRESSION: Cardiomegaly and pulmonary vascular congestion. Electronically Signed   By: 08/30/2018 M.D.   On: 09/16/2019 11:42    Procedures Procedures (including critical care time)  Medications Ordered in ED Medications  sodium chloride flush (NS) 0.9 % injection 3 mL (3 mLs Intravenous Not Given 09/16/19 1057)  carvedilol (COREG) tablet 25 mg (25 mg Oral Given 09/16/19 1136)  losartan (COZAAR)  tablet 100 mg (100 mg Oral Given 09/16/19 1135)  ondansetron (ZOFRAN) injection 4 mg (4 mg Intravenous Given 09/16/19 1137)  HYDROmorphone (DILAUDID) injection 0.5 mg (0.5 mg Intravenous Given 09/16/19 1137)    ED Course  I have reviewed the triage vital signs and the nursing notes.  Pertinent labs & imaging results that were available during my care of the patient were reviewed by me and considered in my medical decision making (see chart for details).    MDM Rules/Calculators/A&P                        Patient's blood pressure here did improve some on his Coreg and Cozaar.  But systolic still around 951.  Showing some evidence of some renal insufficiency.  With a creatinine of 1.3.  Chest x-ray without any acute findings.  EKG without any STEMI findings.  Troponins x2-second troponin elevated but was lower than the first 1.  No concerns for acute cardiac event.  We will have patient follow-up with the wellness clinic so as a place for blood pressure checks.  We will start him back on a month supply of his Coreg Cozaar.  Patient understands the importance of controlling his blood pressure.  He will return for any new or worse symptoms.  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  This is from a urinalysis at urgent care.   Final Clinical Impression(s) / ED Diagnoses Final diagnoses:  Essential hypertension    Rx / DC Orders ED Discharge Orders         Ordered    losartan (COZAAR) 100 MG tablet  Daily     09/16/19 1457    carvedilol (COREG) 25 MG tablet  2 times daily with meals     09/16/19 1457           Fredia Sorrow, MD 09/16/19 [phone]

## 2019-09-16 NOTE — ED Triage Notes (Signed)
Pt reports he had blood in the urine this morning, and felt lightheaded when he was walking.

## 2019-09-23 ENCOUNTER — Encounter (HOSPITAL_COMMUNITY): Payer: Self-pay | Admitting: *Deleted

## 2019-09-23 ENCOUNTER — Emergency Department (HOSPITAL_COMMUNITY)
Admission: EM | Admit: 2019-09-23 | Discharge: 2019-09-23 | Disposition: A | Discharge status: Home / Self Care | Payer: Self-pay | Attending: Emergency Medicine | Admitting: Emergency Medicine

## 2019-09-23 DIAGNOSIS — Z79899 Other long term (current) drug therapy: Secondary | ICD-10-CM | POA: Insufficient documentation

## 2019-09-23 DIAGNOSIS — F17210 Nicotine dependence, cigarettes, uncomplicated: Secondary | ICD-10-CM | POA: Insufficient documentation

## 2019-09-23 DIAGNOSIS — I10 Essential (primary) hypertension: Secondary | ICD-10-CM | POA: Insufficient documentation

## 2019-09-23 DIAGNOSIS — Z139 Encounter for screening, unspecified: Secondary | ICD-10-CM

## 2019-09-23 DIAGNOSIS — R42 Dizziness and giddiness: Secondary | ICD-10-CM | POA: Insufficient documentation

## 2019-09-23 NOTE — Discharge Instructions (Addendum)
It will be important for you to continue to follow with your regular doctor about your blood pressure.  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

## 2019-09-23 NOTE — ED Provider Notes (Signed)
[facility] EMERGENCY DEPARTMENT Provider Note   CSN: [id number] Arrival date & time: 09/23/19  1002     History Chief Complaint  Patient presents with  . work note    Dale Mcintosh is a 34 y.o. male.  Patient is a 34 year old male with a history of hypertension and obstructive sleep apnea who is presenting today requesting a work note to return to full activity.  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  Patient had been off of his blood pressure medications.  Labs were relatively stable.  He was discharged home and given a few days off work.  Patient reports he has been feeling great since he has been home.  He has been taking his medications regularly.  He reports he went to work today and they stated he may be a liability and could not return to work until he had a doctor's note saying he had no restrictions.  Patient states he does not usually seem to be affected by the heat he has been eating and drinking normally and has no complaints at this time.  The history is provided by the patient.       Past Medical History:  Diagnosis Date  . Hypertension   . OSA (obstructive sleep apnea)     Patient Active Problem List   Diagnosis Date Noted  . Educated about COVID-19 virus infection 09/23/2018  . Acute systolic HF (heart failure) (HCC) 09/23/2018  . Hypertensive urgency 09/23/2018  . Chest pain 08/28/2018  . Excessive daytime sleepiness 02/01/2017  . Morning headache 02/01/2017  . Morbid obesity (HCC) 02/01/2017  . Snoring 02/01/2017  . Hypertensive emergency 02/01/2017    Past Surgical History:  Procedure Laterality Date  . NO PAST SURGERIES    . None         Family History  Problem Relation Age of Onset  . Hypertension Father     Social History   Tobacco Use  . Smoking status: Current Every Day Smoker    Packs/day: 0.50    Types:  Cigarettes  . Smokeless tobacco: Current User  . Tobacco comment: "quit smoking 04/20/2019"  Substance Use Topics  . Alcohol use: Not Currently    Comment: occ  . Drug use: No    Home Medications Prior to Admission medications   Medication Sig Start Date End Date Taking? Authorizing Provider  Acetaminophen-Aspirin Buffered (EXCEDRIN BACK & BODY PO) Take 1 tablet by mouth every 6 (six) hours.    [provider]  carvedilol (COREG) 25 MG tablet Take 1 tablet (25 mg total) by mouth 2 (two) times daily with a meal. 02/25/19   Eustace Moore, MD  carvedilol (COREG) 25 MG tablet Take 1 tablet (25 mg total) by mouth 2 (two) times daily with a meal. 09/16/19   Vanetta Mulders, MD  losartan (COZAAR) 100 MG tablet Take 1 tablet (100 mg total) by mouth daily. 02/25/19   Eustace Moore, MD  losartan (COZAAR) 100 MG tablet Take 1 tablet (100 mg total) by mouth daily. 09/16/19   Vanetta Mulders, MD  nitroGLYCERIN (NITROSTAT) 0.4 MG SL tablet Place 1 tablet (0.4 mg total) under the tongue every 5 (five) minutes as needed for chest pain (per CT heart protocol). Patient not taking: Reported on 11/02/2018 08/29/18 02/25/19  Glade Lloyd, MD    Allergies    Bee venom and  Shrimp [shellfish allergy]  Review of Systems   Review of Systems  All other systems reviewed and are negative.   Physical Exam Updated Vital Signs BP (!) 154/99   Pulse 84   Temp 98.2 F (36.8 C) (Oral)   Resp 18   Ht 5\' 11"  (1.803 m)   Wt (!) 138.3 kg   SpO2 99%   BMI 42.54 kg/m   Physical Exam Vitals and nursing note reviewed.  Constitutional:      General: He is not in acute distress.    Appearance: Normal appearance. He is obese.  HENT:     Head: Normocephalic and atraumatic.  Cardiovascular:     Rate and Rhythm: Normal rate and regular rhythm.     Pulses: Normal pulses.  Pulmonary:     Effort: Pulmonary effort is normal.     Breath sounds: Normal breath sounds.  Skin:    General: Skin is  warm and dry.  Neurological:     General: No focal deficit present.     Mental Status: He is alert. Mental status is at baseline.  Psychiatric:        Mood and Affect: Mood normal.        Behavior: Behavior normal.        Thought Content: Thought content normal.     ED Results / Procedures / Treatments   Labs (all labs ordered are listed, but only abnormal results are displayed) Labs Reviewed - No data to display  EKG None  Radiology No results found.  Procedures Procedures (including critical care time)  Medications Ordered in ED Medications - No data to display  ED Course  I have reviewed the triage vital signs and the nursing notes.  Pertinent labs & imaging results that were available during my care of the patient were reviewed by me and considered in my medical decision making (see chart for details).    MDM Rules/Calculators/A&P                      34 year old male presenting today requesting a note to return to work with no restrictions.  Patient was seen last week for hypertension and dizziness.  Patient had been off of his medications.  He is now currently taking his medications.  Blood pressure has improved but still elevated at 154/99.  Encouraged to follow-up with PCP for ongoing blood pressure management but at this time feel that patient can return to work with no restrictions.  Encouraged him to stay well-hydrated and rest when needed. Final Clinical Impression(s) / ED Diagnoses Final diagnoses:  Encounter for medical screening examination    Rx / DC Orders ED Discharge Orders    None       Blanchie Dessert, MD 09/23/19 1027

## 2019-09-23 NOTE — ED Triage Notes (Signed)
Pt needs a doctors clearance to return to work. 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 Pt without complaints.

## 2019-09-27 ENCOUNTER — Ambulatory Visit (HOSPITAL_COMMUNITY)
Admission: EM | Admit: 2019-09-27 | Discharge: 2019-09-27 | Disposition: A | Discharge status: Home / Self Care | Payer: Self-pay | Attending: Family Medicine | Admitting: Family Medicine

## 2019-09-27 ENCOUNTER — Other Ambulatory Visit: Payer: Self-pay

## 2019-09-27 ENCOUNTER — Encounter (HOSPITAL_COMMUNITY): Payer: Self-pay

## 2019-09-27 DIAGNOSIS — R310 Gross hematuria: Secondary | ICD-10-CM

## 2019-09-27 DIAGNOSIS — N390 Urinary tract infection, site not specified: Secondary | ICD-10-CM

## 2019-09-27 LAB — POCT URINALYSIS DIP (DEVICE)
Bilirubin Urine: NEGATIVE
Glucose, UA: NEGATIVE mg/dL
Hgb urine dipstick: NEGATIVE
Ketones, ur: NEGATIVE mg/dL
Leukocytes,Ua: NEGATIVE
Nitrite: NEGATIVE
Protein, ur: NEGATIVE mg/dL
Specific Gravity, Urine: 1.025 (ref 1.005–1.030)
Urobilinogen, UA: 0.2 mg/dL (ref 0.0–1.0)
pH: 6 (ref 5.0–8.0)

## 2019-09-27 NOTE — ED Provider Notes (Signed)
[facility]    CSN: [id number] Arrival date & time: 09/27/19  0848      History   Chief Complaint Chief Complaint  Patient presents with  . Urinary Tract Infection    HPI Dale Mcintosh is a 34 y.o. male history of hypertension, OSA, presenting today for evaluation of hematuria.  Patient notes that approximately 1 to 2 weeks ago he was having hematuria with associated dizziness, he was seen in the emergency room and UA was unremarkable.  He reports that the hematuria resolved, but returned again today.  He denies any other associated symptoms with hematuria, denies dysuria, increased frequency, urgency, penile discharge or hesitancy.  Is unsure if the hematuria could be related to elevated blood pressure.  He took his blood pressure medicine 15 minutes before arrival this morning.  He does believe he has history of prior kidney stones, but is unsure if these ever passed.  He does report tobacco use.  Denies abdominal pain, back pain.  Denies chest pain or shortness of breath.  Denies headaches, vision changes, lightheadedness or dizziness.  HPI  Past Medical History:  Diagnosis Date  . Hypertension   . OSA (obstructive sleep apnea)     Patient Active Problem List   Diagnosis Date Noted  . Educated about COVID-19 virus infection 09/23/2018  . Acute systolic HF (heart failure) (HCC) 09/23/2018  . Hypertensive urgency 09/23/2018  . Chest pain 08/28/2018  . Excessive daytime sleepiness 02/01/2017  . Morning headache 02/01/2017  . Morbid obesity (HCC) 02/01/2017  . Snoring 02/01/2017  . Hypertensive emergency 02/01/2017    Past Surgical History:  Procedure Laterality Date  . NO PAST SURGERIES    . None         Home Medications    Prior to Admission medications   Medication Sig Start Date End Date Taking? Authorizing Provider  Acetaminophen-Aspirin Buffered (EXCEDRIN BACK & BODY PO) Take 1 tablet by mouth every 6 (six) hours.    [provider]    carvedilol (COREG) 25 MG tablet Take 1 tablet (25 mg total) by mouth 2 (two) times daily with a meal. 02/25/19   Eustace Moore, MD  carvedilol (COREG) 25 MG tablet Take 1 tablet (25 mg total) by mouth 2 (two) times daily with a meal. 09/16/19   Vanetta Mulders, MD  losartan (COZAAR) 100 MG tablet Take 1 tablet (100 mg total) by mouth daily. 02/25/19   Eustace Moore, MD  losartan (COZAAR) 100 MG tablet Take 1 tablet (100 mg total) by mouth daily. 09/16/19   Vanetta Mulders, MD  nitroGLYCERIN (NITROSTAT) 0.4 MG SL tablet Place 1 tablet (0.4 mg total) under the tongue every 5 (five) minutes as needed for chest pain (per CT heart protocol). Patient not taking: Reported on 11/02/2018 08/29/18 02/25/19  Glade Lloyd, MD    Family History Family History  Problem Relation Age of Onset  . Hypertension Father     Social History Social History   Tobacco Use  . Smoking status: Current Every Day Smoker    Packs/day: 0.50    Types: Cigarettes  . Smokeless tobacco: Current User  . Tobacco comment: "quit smoking 04/20/2019"  Substance Use Topics  . Alcohol use: Not Currently    Comment: occ  . Drug use: No     Allergies   Bee venom and Shrimp [shellfish allergy]   Review of Systems Review of Systems  Constitutional: Negative for fever.  HENT: Negative for sore throat.   Respiratory: Negative for  shortness of breath.   Cardiovascular: Negative for chest pain.  Gastrointestinal: Negative for abdominal pain, nausea and vomiting.  Genitourinary: Positive for hematuria. Negative for difficulty urinating, discharge, dysuria, frequency, penile pain, penile swelling, scrotal swelling and testicular pain.  Skin: Negative for rash.  Neurological: Negative for dizziness, light-headedness and headaches.     Physical Exam Triage Vital Signs ED Triage Vitals  Enc Vitals Group     BP 09/27/19 0901 (!) 185/118     Pulse Rate 09/27/19 0901 96     Resp 09/27/19 0901 16     Temp  09/27/19 0901 98.8 F (37.1 C)     Temp Source 09/27/19 0901 Oral     SpO2 09/27/19 0901 100 %     Weight --      Height --      Head Circumference --      Peak Flow --      Pain Score 09/27/19 0905 0     Pain Loc --      Pain Edu? --      Excl. in [place]? --    No data found.  Updated Vital Signs BP (!) 158/108 (BP Location: Left Arm)   Pulse 96   Temp 98.8 F (37.1 C) (Oral)   Resp 16   SpO2 100%   Visual Acuity Right Eye Distance:   Left Eye Distance:   Bilateral Distance:    Right Eye Near:   Left Eye Near:    Bilateral Near:     Physical Exam Vitals and nursing note reviewed.  Constitutional:      Appearance: He is well-developed.     Comments: No acute distress  HENT:     Head: Normocephalic and atraumatic.     Nose: Nose normal.     Mouth/Throat:     Comments: Oral mucosa pink and moist, no tonsillar enlargement or exudate. Posterior pharynx patent and nonerythematous, no uvula deviation or swelling. Normal phonation. Eyes:     Extraocular Movements: Extraocular movements intact.     Conjunctiva/sclera: Conjunctivae normal.     Pupils: Pupils are equal, round, and reactive to light.     Comments: Wearing glasses  Cardiovascular:     Rate and Rhythm: Normal rate.  Pulmonary:     Effort: Pulmonary effort is normal. No respiratory distress.     Comments: Breathing comfortably at rest, CTABL, no wheezing, rales or other adventitious sounds auscultated Abdominal:     General: There is no distension.     Comments: Soft, nondistended, nontender to light deep palpation throughout abdomen, no CVA tenderness  Musculoskeletal:        General: Normal range of motion.     Cervical back: Neck supple.  Skin:    General: Skin is warm and dry.  Neurological:     Mental Status: He is alert and oriented to person, place, and time.      UC Treatments / Results  Labs (all labs ordered are listed, but only abnormal results are displayed) Labs Reviewed  POCT  URINALYSIS DIP (DEVICE)    EKG   Radiology No results found.  Procedures Procedures (including critical care time)  Medications Ordered in UC Medications - No data to display  Initial Impression / Assessment and Plan / UC Course  I have reviewed the triage vital signs and the nursing notes.  Pertinent labs & imaging results that were available during my care of the patient were reviewed by me and considered in my medical decision making (  see chart for details).     UA today unremarkable for hematuria or signs of infection.  Blood pressure improved on second recheck.  Has had slightly elevated creatinine in the past.  Possible relation of hypertension/CKD causing hematuria, given associated tobacco use, also recommended for patient to follow-up with PCP and/or urology for further evaluation of this.  Discussed strict return precautions. Patient verbalized understanding and is agreeable with plan.  Final Clinical Impressions(s) / UC Diagnoses   Final diagnoses:  Gross hematuria     Discharge Instructions     Urine normal today Continue to monitor drink plenty of fluids Follow up if developing pain  Follow up with primary care as planned If continuing to see blood in urine please follow up with urology   ED Prescriptions    None     PDMP not reviewed this encounter.   Sharyon Cable [place] C, PA-C 09/27/19 1025

## 2019-09-27 NOTE — ED Triage Notes (Signed)
Pt presents with blood in urine X 1 week.

## 2019-09-27 NOTE — Discharge Instructions (Signed)
Urine normal today Continue to monitor drink plenty of fluids Follow up if developing pain  Follow up with primary care as planned If continuing to see blood in urine please follow up with urology

## 2019-10-04 NOTE — Progress Notes (Signed)
Patient ID: Dale Mcintosh, male   DOB: [birth year], 34 y.o.   MRN: [id number]  Virtual Visit via Telephone Note  I connected with Dale Mcintosh on 10/06/19 at  8:50 AM EDT by telephone and verified that I am speaking with the correct person using two identifiers.   I discussed the limitations, risks, security and privacy concerns of performing an evaluation and management service by telephone and the availability of in person appointments. I also discussed with the patient that there may be a patient responsible charge related to this service. The patient expressed understanding and agreed to proceed.  PATIENT visit by telephone virtually in the context of Covid-19 pandemic. Patient location:  home My Location:  [facility] office Persons on the call:  Me and the patient    History of Present Illness: After ED visit 09/27/2019.  He has not had any further episodes of what seems like hematuria.  His urine was neg for blood in ER.  He denies dizziness/HA.  Not checking blood pressures.  Admits to not drinking any water.    From ED note: HPI Dale Mcintosh is a 34 y.o. male history of hypertension, OSA, presenting today for evaluation of hematuria.  Patient notes that approximately 1 to 2 weeks ago he was having hematuria with associated dizziness, he was seen in the emergency room and UA was unremarkable.  He reports that the hematuria resolved, but returned again today.  He denies any other associated symptoms with hematuria, denies dysuria, increased frequency, urgency, penile discharge or hesitancy.  Is unsure if the hematuria could be related to elevated blood pressure.  He took his blood pressure medicine 15 minutes before arrival this morning.  He does believe he has history of prior kidney stones, but is unsure if these ever passed.  He does report tobacco use.  Denies abdominal pain, back pain.  Denies chest pain or shortness of breath.  Denies headaches, vision changes, lightheadedness or  dizziness.  From A/P: UA today unremarkable for hematuria or signs of infection.  Blood pressure improved on second recheck.  Has had slightly elevated creatinine in the past.  Possible relation of hypertension/CKD causing hematuria, given associated tobacco use, also recommended for patient to follow-up with PCP and/or urology for further evaluation of this.    Observations/Objective:  NAD.  A&Ox3   Assessment and Plan: 1. Hypertension, unspecified type On meds.  Check BP OOO and record and bring to next visit - losartan (COZAAR) 100 MG tablet; Take 1 tablet (100 mg total) by mouth daily.  Dispense: 30 tablet; Refill: 1 - carvedilol (COREG) 25 MG tablet; Take 1 tablet (25 mg total) by mouth 2 (two) times daily with a meal.  Dispense: 60 tablet; Refill: 1  2. Encounter for examination following treatment at hospital Doing better  3. Hematuria, unspecified type Seems to have resolved  4. Abnormal kidney function Drink 80-100 ounces water daily     Follow Up Instructions: Assign PCP in  1 month and recheck labs/urine and BP.   I discussed the assessment and treatment plan with the patient. The patient was provided an opportunity to ask questions and all were answered. The patient agreed with the plan and demonstrated an understanding of the instructions.   The patient was advised to call back or seek an in-person evaluation if the symptoms worsen or if the condition fails to improve as anticipated.  I provided 12 minutes of non-face-to-face time during this encounter.   Georgian Co, PA-C

## 2019-10-06 ENCOUNTER — Other Ambulatory Visit: Payer: Self-pay

## 2019-10-06 ENCOUNTER — Ambulatory Visit: Payer: Self-pay | Attending: Family Medicine | Admitting: Physician Assistant

## 2019-10-06 DIAGNOSIS — N289 Disorder of kidney and ureter, unspecified: Secondary | ICD-10-CM

## 2019-10-06 DIAGNOSIS — Z09 Encounter for follow-up examination after completed treatment for conditions other than malignant neoplasm: Secondary | ICD-10-CM

## 2019-10-06 DIAGNOSIS — I10 Essential (primary) hypertension: Secondary | ICD-10-CM

## 2019-10-06 DIAGNOSIS — R319 Hematuria, unspecified: Secondary | ICD-10-CM

## 2019-10-06 MED ORDER — CARVEDILOL 25 MG PO TABS: 25.0000 mg | ORAL_TABLET | Freq: Two times a day (BID) | ORAL | 1 refills | Status: DC

## 2019-10-06 MED ORDER — LOSARTAN POTASSIUM 100 MG PO TABS: 100.0000 mg | ORAL_TABLET | Freq: Every day | ORAL | 1 refills | Status: DC

## 2019-10-06 MED FILL — LOSARTAN POTASSIUM 100 MG T: 100 | 30 days supply | Qty: 30 | Fill #0

## 2019-10-06 MED FILL — CARVEDILOL 25 MG TABLET: 25 | 30 days supply | Qty: 60 | Fill #0

## 2019-11-24 ENCOUNTER — Ambulatory Visit: Payer: Self-pay | Admitting: Physician Assistant

## 2019-11-26 MED FILL — CARVEDILOL 25 MG TABLET: 25 | 30 days supply | Qty: 60 | Fill #0

## 2019-11-26 MED FILL — LOSARTAN POTASSIUM 100 MG T: 100 | 30 days supply | Qty: 30 | Fill #0

## 2019-12-08 ENCOUNTER — Other Ambulatory Visit: Payer: Self-pay

## 2019-12-08 ENCOUNTER — Ambulatory Visit (HOSPITAL_COMMUNITY)
Admission: EM | Admit: 2019-12-08 | Discharge: 2019-12-08 | Disposition: A | Discharge status: Home / Self Care | Payer: HRSA Program | Attending: Family Medicine | Admitting: Family Medicine

## 2019-12-08 ENCOUNTER — Encounter (HOSPITAL_COMMUNITY): Payer: Self-pay

## 2019-12-08 DIAGNOSIS — Z20822 Contact with and (suspected) exposure to covid-19: Secondary | ICD-10-CM

## 2019-12-08 DIAGNOSIS — Z1152 Encounter for screening for COVID-19: Secondary | ICD-10-CM | POA: Diagnosis present

## 2019-12-08 NOTE — Discharge Instructions (Signed)
Self isolate until covid results are back and negative.  °Will notify you by phone of any positive findings. Your negative results will be sent through your MyChart.     ° °

## 2019-12-08 NOTE — ED Triage Notes (Signed)
Pt presents for COVID testing.Reporst he was exposed 6 days ago. Denies any symptoms.

## 2019-12-09 LAB — SARS CORONAVIRUS 2 (TAT 6-24 HRS): SARS Coronavirus 2: NEGATIVE

## 2019-12-10 NOTE — ED Provider Notes (Signed)
[facility]    CSN: [id number] Arrival date & time: 12/08/19  1936      History   Chief Complaint Chief Complaint  Patient presents with   Covid Exposure    HPI Dale Mcintosh is a 34 y.o. male.   Dale Mcintosh presents with requests for covid testing. He was exposed approximately 6 days ago at work. Denies any complaints of symptoms here today.     ROS per HPI, negative if not otherwise mentioned.      Past Medical History:  Diagnosis Date   Hypertension    OSA (obstructive sleep apnea)     Patient Active Problem List   Diagnosis Date Noted   Educated about COVID-19 virus infection 09/23/2018   Acute systolic HF (heart failure) (HCC) 09/23/2018   Hypertensive urgency 09/23/2018   Chest pain 08/28/2018   Excessive daytime sleepiness 02/01/2017   Morning headache 02/01/2017   Morbid obesity (HCC) 02/01/2017   Snoring 02/01/2017   Hypertensive emergency 02/01/2017    Past Surgical History:  Procedure Laterality Date   NO PAST SURGERIES     None         Home Medications    Prior to Admission medications   Medication Sig Start Date End Date Taking? Authorizing Provider  Acetaminophen-Aspirin Buffered (EXCEDRIN BACK & BODY PO) Take 1 tablet by mouth every 6 (six) hours.    [provider]  carvedilol (COREG) 25 MG tablet Take 1 tablet (25 mg total) by mouth 2 (two) times daily with a meal. 10/06/19   McClung, Marzella Schlein, PA-C  losartan (COZAAR) 100 MG tablet Take 1 tablet (100 mg total) by mouth daily. 10/06/19   Anders Simmonds, PA-C  nitroGLYCERIN (NITROSTAT) 0.4 MG SL tablet Place 1 tablet (0.4 mg total) under the tongue every 5 (five) minutes as needed for chest pain (per CT heart protocol). Patient not taking: Reported on 11/02/2018 08/29/18 02/25/19  Glade Lloyd, MD    Family History Family History  Problem Relation Age of Onset   Hypertension Father     Social History Social History   Tobacco Use   Smoking  status: Current Every Day Smoker    Packs/day: 0.50    Types: Cigarettes   Smokeless tobacco: Current User   Tobacco comment: "quit smoking 04/20/2019"  Vaping Use   Vaping Use: Never used  Substance Use Topics   Alcohol use: Not Currently    Comment: occ   Drug use: No     Allergies   Bee venom and Shrimp [shellfish allergy]   Review of Systems Review of Systems   Physical Exam Triage Vital Signs ED Triage Vitals  Enc Vitals Group     BP 12/08/19 2020 (!) 161/115     Pulse Rate 12/08/19 2020 73     Resp 12/08/19 2020 20     Temp 12/08/19 2020 99 F (37.2 C)     Temp Source 12/08/19 2020 Oral     SpO2 12/08/19 2020 97 %     Weight --      Height --      Head Circumference --      Peak Flow --      Pain Score 12/08/19 2018 0     Pain Loc --      Pain Edu? --      Excl. in [place]? --    No data found.  Updated Vital Signs BP (!) 161/115 (BP Location: Right Arm)    Pulse 73  Temp 99 F (37.2 C) (Oral)    Resp 20    SpO2 97%   Visual Acuity Right Eye Distance:   Left Eye Distance:   Bilateral Distance:    Right Eye Near:   Left Eye Near:    Bilateral Near:     Physical Exam Constitutional:      Appearance: He is well-developed.  Cardiovascular:     Rate and Rhythm: Normal rate.  Pulmonary:     Effort: Pulmonary effort is normal.  Skin:    General: Skin is warm and dry.  Neurological:     Mental Status: He is alert and oriented to person, place, and time.      UC Treatments / Results  Labs (all labs ordered are listed, but only abnormal results are displayed) Labs Reviewed  SARS CORONAVIRUS 2 (TAT 6-24 HRS)    EKG   Radiology No results found.  Procedures Procedures (including critical care time)  Medications Ordered in UC Medications - No data to display  Initial Impression / Assessment and Plan / UC Course  I have reviewed the triage vital signs and the nursing notes.  Pertinent labs & imaging results that were available  during my care of the patient were reviewed by me and considered in my medical decision making (see chart for details).     Covid screening completed, will notify if positive. Patient verbalized understanding and agreeable to plan.   Final Clinical Impressions(s) / UC Diagnoses   Final diagnoses:  Encounter for screening for COVID-19  Exposure to COVID-19 virus     Discharge Instructions     Self isolate until covid results are back and negative.  Will notify you by phone of any positive findings. Your negative results will be sent through your MyChart.       ED Prescriptions    None     PDMP not reviewed this encounter.   Georgetta Haber, NP 12/10/19 [phone]

## 2020-01-04 ENCOUNTER — Other Ambulatory Visit: Payer: Self-pay

## 2020-01-04 ENCOUNTER — Encounter: Payer: Self-pay | Admitting: Nurse Practitioner

## 2020-01-04 ENCOUNTER — Ambulatory Visit: Payer: Self-pay | Attending: Nurse Practitioner | Admitting: Nurse Practitioner

## 2020-01-04 ENCOUNTER — Other Ambulatory Visit: Payer: Self-pay | Admitting: Nurse Practitioner

## 2020-01-04 VITALS — BP 205/146 | HR 80 | Temp 97.7°F | Ht 70.5 in | Wt 303.0 lb

## 2020-01-04 DIAGNOSIS — Z1159 Encounter for screening for other viral diseases: Secondary | ICD-10-CM

## 2020-01-04 DIAGNOSIS — I10 Essential (primary) hypertension: Secondary | ICD-10-CM

## 2020-01-04 DIAGNOSIS — F329 Major depressive disorder, single episode, unspecified: Secondary | ICD-10-CM

## 2020-01-04 MED ORDER — CLONIDINE HCL 0.1 MG PO TABS
0.2000 mg | ORAL_TABLET | Freq: Once | ORAL | Status: AC
  Administered 2020-01-04: 0.2 mg via ORAL

## 2020-01-04 MED ORDER — AMLODIPINE BESYLATE 10 MG PO TABS: 10.0000 mg | ORAL_TABLET | Freq: Every day | ORAL | 3 refills | Status: DC

## 2020-01-04 MED FILL — AMLODIPINE BESYLATE 10 MG T: 10 | 30 days supply | Qty: 30 | Fill #0

## 2020-01-04 MED FILL — CARVEDILOL 25 MG TABLET: 25 | 30 days supply | Qty: 60 | Fill #1

## 2020-01-04 MED FILL — LOSARTAN POTASSIUM 100 MG T: 100 | 30 days supply | Qty: 30 | Fill #1

## 2020-01-04 NOTE — Progress Notes (Signed)
Assessment & Plan:  Sailor was seen today for establish care.  Diagnoses and all orders for this visit:  Essential hypertension -     cloNIDine (CATAPRES) tablet 0.2 mg -     amLODipine (NORVASC) 10 MG tablet; Take 1 tablet (10 mg total) by mouth daily. Continue all antihypertensives as prescribed.  Remember to bring in your blood pressure log with you for your follow up appointment.  DASH/Mediterranean Diets are healthier choices for HTN.    Need for hepatitis C screening test -     Hepatitis C Antibody  Reactive depression Declines SSRI Recommended grief counseling  Patient has been counseled on age-appropriate routine health concerns for screening and prevention. These are reviewed and up-to-date. Referrals have been placed accordingly. Immunizations are up-to-date or declined.    Subjective:   Chief Complaint  Patient presents with  . Establish Care    Pt. is here to establish care for hypertension.    HPI Dale Mcintosh 34 y.o. male presents to office today to establish care.  has a past medical history of Hypertension and OSA (obstructive sleep apnea). Does not have a CPAP and was diagnosed with sleep apnea 2-3 years ago. Will need sleep study. States his DAD has a machine that he does not use. He is aware he will still need a sleep study and the sleep center or DME company can evaluate his father's current CPAP for settings and possible use.   Notes increased depression. States his mother passed away unexpectedly last month. He has not really been keeping up with his blood pressure.     Essential Hypertension Poorly controlled. Denies chest pain, shortness of breath, palpitations, lightheadedness, dizziness, headaches or BLE edema. Will increase carvedilol from 12.5 mg BID to 25 mg BID today. He will continue on amlodipine 10 mg and losartan 100 mg daily.  BP Readings from Last 3 Encounters:  01/04/20 (!) 205/146  12/08/19 (!) 161/115  09/27/19 (!) 158/108     Review of Systems  Constitutional: Negative for fever, malaise/fatigue and weight loss.  HENT: Negative.  Negative for nosebleeds.   Eyes: Negative.  Negative for blurred vision, double vision and photophobia.  Respiratory: Negative.  Negative for cough and shortness of breath.   Cardiovascular: Negative.  Negative for chest pain, palpitations and leg swelling.  Gastrointestinal: Negative.  Negative for heartburn, nausea and vomiting.  Musculoskeletal: Negative.  Negative for myalgias.  Neurological: Negative.  Negative for dizziness, focal weakness, seizures and headaches.  Psychiatric/Behavioral: Positive for depression. Negative for suicidal ideas.    Past Medical History:  Diagnosis Date  . Hypertension   . OSA (obstructive sleep apnea)     Past Surgical History:  Procedure Laterality Date  . NO PAST SURGERIES    . None      Family History  Problem Relation Age of Onset  . Hypertension Mother   . Hypertension Father     Social History Reviewed with no changes to be made today.   Outpatient Medications Prior to Visit  Medication Sig Dispense Refill  . carvedilol (COREG) 25 MG tablet Take 1 tablet (25 mg total) by mouth 2 (two) times daily with a meal. 60 tablet 1  . losartan (COZAAR) 100 MG tablet Take 1 tablet (100 mg total) by mouth daily. 30 tablet 1  . Acetaminophen-Aspirin Buffered (EXCEDRIN BACK & BODY PO) Take 1 tablet by mouth every 6 (six) hours.     No facility-administered medications prior to visit.    Allergies  Allergen  Reactions  . Bee Venom Anaphylaxis  . Shrimp [Shellfish Allergy] Itching       Objective:    BP (!) 205/146 (BP Location: Left Arm, Patient Position: Sitting, Cuff Size: Large)   Pulse 80   Temp 97.7 F (36.5 C) (Temporal)   Ht 5' 10.5" (1.791 m)   Wt (!) 303 lb (137.4 kg)   SpO2 100%   BMI 42.86 kg/m  Wt Readings from Last 3 Encounters:  01/04/20 (!) 303 lb (137.4 kg)  09/23/19 (!) 305 lb (138.3 kg)  09/16/19 (!)  307 lb (139.3 kg)    Physical Exam Vitals and nursing note reviewed.  Constitutional:      Appearance: He is well-developed.  HENT:     Head: Normocephalic and atraumatic.  Cardiovascular:     Rate and Rhythm: Normal rate and regular rhythm.     Heart sounds: Normal heart sounds. No murmur heard.  No friction rub. No gallop.   Pulmonary:     Effort: Pulmonary effort is normal. No tachypnea or respiratory distress.     Breath sounds: Normal breath sounds. No decreased breath sounds, wheezing, rhonchi or rales.  Chest:     Chest wall: No tenderness.  Abdominal:     General: Bowel sounds are normal.     Palpations: Abdomen is soft.  Musculoskeletal:        General: Normal range of motion.     Cervical back: Normal range of motion.  Skin:    General: Skin is warm and dry.  Neurological:     Mental Status: He is alert and oriented to person, place, and time.     Coordination: Coordination normal.  Psychiatric:        Attention and Perception: Attention normal.        Behavior: Behavior normal. Behavior is cooperative.        Thought Content: Thought content does not include homicidal or suicidal ideation. Thought content does not include homicidal or suicidal plan.        Cognition and Memory: Cognition and memory normal.        Judgment: Judgment normal.          Patient has been counseled extensively about nutrition and exercise as well as the importance of adherence with medications and regular follow-up. The patient was given clear instructions to go to ER or return to medical center if symptoms don't improve, worsen or new problems develop. The patient verbalized understanding.   Follow-up: Return in about 2 weeks (around 01/18/2020) for BP CHECK WITH LUKE see me in 2 months.   Claiborne Rigg, FNP-BC [facility] [place], [place] [phone]   01/06/2020, 6:47 PM

## 2020-01-04 NOTE — Patient Instructions (Signed)

## 2020-01-05 LAB — HEPATITIS C ANTIBODY: Hep C Virus Ab: <0.1 s/co ratio (ref 0.0–0.9)

## 2020-01-06 ENCOUNTER — Other Ambulatory Visit: Payer: Self-pay | Admitting: Nurse Practitioner

## 2020-01-06 ENCOUNTER — Encounter: Payer: Self-pay | Admitting: Nurse Practitioner

## 2020-01-06 MED ORDER — CARVEDILOL 25 MG PO TABS: 25.0000 mg | ORAL_TABLET | Freq: Two times a day (BID) | ORAL | 1 refills | Status: DC

## 2020-01-06 MED ORDER — LOSARTAN POTASSIUM 100 MG PO TABS: 100.0000 mg | ORAL_TABLET | Freq: Every day | ORAL | 1 refills | Status: DC

## 2020-01-13 MED FILL — LOSARTAN POTASSIUM 100 MG T: 100 | 30 days supply | Qty: 30 | Fill #1

## 2020-01-13 MED FILL — CARVEDILOL 25 MG TABLET: 25 | 30 days supply | Qty: 60 | Fill #1

## 2020-01-28 ENCOUNTER — Ambulatory Visit (HOSPITAL_COMMUNITY)
Admission: EM | Admit: 2020-01-28 | Discharge: 2020-01-28 | Disposition: A | Discharge status: Home / Self Care | Payer: HRSA Program | Attending: Family Medicine | Admitting: Family Medicine

## 2020-01-28 ENCOUNTER — Other Ambulatory Visit: Payer: Self-pay

## 2020-01-28 ENCOUNTER — Encounter (HOSPITAL_COMMUNITY): Payer: Self-pay

## 2020-01-28 ENCOUNTER — Ambulatory Visit (INDEPENDENT_AMBULATORY_CARE_PROVIDER_SITE_OTHER): Payer: HRSA Program

## 2020-01-28 DIAGNOSIS — J069 Acute upper respiratory infection, unspecified: Secondary | ICD-10-CM | POA: Diagnosis not present

## 2020-01-28 DIAGNOSIS — R079 Chest pain, unspecified: Secondary | ICD-10-CM | POA: Insufficient documentation

## 2020-01-28 DIAGNOSIS — R0789 Other chest pain: Secondary | ICD-10-CM

## 2020-01-28 DIAGNOSIS — R05 Cough: Secondary | ICD-10-CM

## 2020-01-28 DIAGNOSIS — I10 Essential (primary) hypertension: Secondary | ICD-10-CM | POA: Insufficient documentation

## 2020-01-28 DIAGNOSIS — F17210 Nicotine dependence, cigarettes, uncomplicated: Secondary | ICD-10-CM | POA: Diagnosis not present

## 2020-01-28 DIAGNOSIS — Z20822 Contact with and (suspected) exposure to covid-19: Secondary | ICD-10-CM | POA: Diagnosis not present

## 2020-01-28 DIAGNOSIS — G4733 Obstructive sleep apnea (adult) (pediatric): Secondary | ICD-10-CM | POA: Insufficient documentation

## 2020-01-28 DIAGNOSIS — Z79899 Other long term (current) drug therapy: Secondary | ICD-10-CM | POA: Diagnosis not present

## 2020-01-28 DIAGNOSIS — Z6841 Body Mass Index (BMI) 40.0 and over, adult: Secondary | ICD-10-CM | POA: Diagnosis not present

## 2020-01-28 MED ORDER — ALBUTEROL SULFATE HFA 108 (90 BASE) MCG/ACT IN AERS
INHALATION_SPRAY | RESPIRATORY_TRACT | Status: AC
  Filled 2020-01-28: qty 6.7

## 2020-01-28 MED ORDER — ALBUTEROL SULFATE HFA 108 (90 BASE) MCG/ACT IN AERS
2.0000 | INHALATION_SPRAY | Freq: Once | RESPIRATORY_TRACT | Status: AC
  Administered 2020-01-28: 2 via RESPIRATORY_TRACT

## 2020-01-28 NOTE — ED Triage Notes (Signed)
Pt c/o non productive cough and mid sternal chest tightness that started yesterday at work. Pt c/o intermittent SOB. Pt denies N/V.

## 2020-01-28 NOTE — ED Provider Notes (Signed)
[facility]    CSN: [id number] Arrival date & time: 01/28/20  0801      History   Chief Complaint Chief Complaint  Patient presents with  . Cough    HPI Dale Mcintosh is a 34 y.o. male.   The history is provided by the patient. No language interpreter was used.  Cough Cough characteristics:  Non-productive Sputum characteristics:  Nondescript Severity:  Moderate Onset quality:  Gradual Duration:  1 day Timing:  Constant Progression:  Worsening Chronicity:  New Relieved by:  Nothing Worsened by:  Nothing Ineffective treatments:  None tried Associated symptoms: shortness of breath   Associated symptoms: no fever   Pt complains of a cough and congestion.  Pt denies chest pain.  Pt reports he feels congested.   Past Medical History:  Diagnosis Date  . Hypertension   . OSA (obstructive sleep apnea)     Patient Active Problem List   Diagnosis Date Noted  . Educated about COVID-19 virus infection 09/23/2018  . Acute systolic HF (heart failure) (HCC) 09/23/2018  . Hypertensive urgency 09/23/2018  . Chest pain 08/28/2018  . Excessive daytime sleepiness 02/01/2017  . Morning headache 02/01/2017  . Morbid obesity (HCC) 02/01/2017  . Snoring 02/01/2017  . Hypertensive emergency 02/01/2017    Past Surgical History:  Procedure Laterality Date  . NO PAST SURGERIES    . None         Home Medications    Prior to Admission medications   Medication Sig Start Date End Date Taking? Authorizing Provider  amLODipine (NORVASC) 10 MG tablet Take 1 tablet (10 mg total) by mouth daily. 01/04/20   Claiborne Rigg, NP  carvedilol (COREG) 25 MG tablet Take 1 tablet (25 mg total) by mouth 2 (two) times daily with a meal. 01/06/20   Claiborne Rigg, NP  losartan (COZAAR) 100 MG tablet Take 1 tablet (100 mg total) by mouth daily. 01/06/20   Claiborne Rigg, NP  nitroGLYCERIN (NITROSTAT) 0.4 MG SL tablet Place 1 tablet (0.4 mg total) under the tongue every 5 (five)  minutes as needed for chest pain (per CT heart protocol). Patient not taking: Reported on 11/02/2018 08/29/18 02/25/19  Glade Lloyd, MD    Family History Family History  Problem Relation Age of Onset  . Hypertension Mother   . Hypertension Father     Social History Social History   Tobacco Use  . Smoking status: Current Every Day Smoker    Packs/day: 0.50    Types: Cigarettes  . Smokeless tobacco: Current User  Vaping Use  . Vaping Use: Never used  Substance Use Topics  . Alcohol use: Yes    Comment: occ  . Drug use: Yes    Types: Marijuana     Allergies   Bee venom and Shrimp [shellfish allergy]   Review of Systems Review of Systems  Constitutional: Negative for fever.  Respiratory: Positive for cough and shortness of breath.   All other systems reviewed and are negative.    Physical Exam Triage Vital Signs ED Triage Vitals [01/28/20 0825]  Enc Vitals Group     BP (!) 181/119     Pulse Rate 74     Resp 16     Temp 97.7 F (36.5 C)     Temp Source Oral     SpO2 98 %     Weight 297 lb (134.7 kg)     Height 5\' 10"  (1.778 m)     Head Circumference  Peak Flow      Pain Score 0     Pain Loc      Pain Edu?      Excl. in GC?    No data found.  Updated Vital Signs BP (!) 181/119   Pulse 74   Temp 97.7 F (36.5 C) (Oral)   Resp 16   Ht 5\' 10"  (1.778 m)   Wt 134.7 kg   SpO2 98%   BMI 42.62 kg/m   Visual Acuity Right Eye Distance:   Left Eye Distance:   Bilateral Distance:    Right Eye Near:   Left Eye Near:    Bilateral Near:     Physical Exam Vitals and nursing note reviewed.  Constitutional:      Appearance: He is well-developed.  HENT:     Head: Normocephalic and atraumatic.  Eyes:     Conjunctiva/sclera: Conjunctivae normal.  Cardiovascular:     Rate and Rhythm: Normal rate and regular rhythm.     Heart sounds: No murmur heard.   Pulmonary:     Effort: Pulmonary effort is normal. No respiratory distress.     Breath  sounds: Wheezing present.  Abdominal:     Palpations: Abdomen is soft.     Tenderness: There is no abdominal tenderness.  Musculoskeletal:        General: Normal range of motion.     Cervical back: Neck supple.  Skin:    General: Skin is warm and dry.  Neurological:     General: No focal deficit present.     Mental Status: He is alert.  Psychiatric:        Mood and Affect: Mood normal.      UC Treatments / Results  Labs (all labs ordered are listed, but only abnormal results are displayed) Labs Reviewed  SARS CORONAVIRUS 2 (TAT 6-24 HRS)    EKG   Radiology DG Chest 2 View  Result Date: 01/28/2020 CLINICAL DATA:  Nonproductive cough.  Chest tightness. EXAM: CHEST - 2 VIEW COMPARISON:  09/16/2019. FINDINGS: Mediastinum and hilar structures normal. Prominent cardiomegaly with mild pulmonary venous congestion again noted. Low lung volumes. No focal infiltrate. No pleural effusion or pneumothorax. No acute bony abnormality. IMPRESSION: 1. Prominent cardiomegaly with mild pulmonary venous congestion again noted. No evidence of overt congestive heart failure. 2. Low lung volumes. No focal infiltrate. Chest is stable from prior exam. Electronically Signed   By: 09/18/2019  Register   On: 01/28/2020 09:10    Procedures Procedures (including critical care time)  Medications Ordered in UC Medications  albuterol (VENTOLIN HFA) 108 (90 Base) MCG/ACT inhaler 2 puff (2 puffs Inhalation Provided for home use 01/28/20 0919)    Initial Impression / Assessment and Plan / UC Course  I have reviewed the triage vital signs and the nursing notes.  Pertinent labs & imaging results that were available during my care of the patient were reviewed by me and considered in my medical decision making (see chart for details).     Pt reports he feels better after albuterol.  Pt advised to use inhaler.  Covid is pending.  Pt's lungs are clear after albuterol.   Final Clinical Impressions(s) / UC Diagnoses    Final diagnoses:  Viral URI with cough     Discharge Instructions     Use inhaler 2 puffs every 4 hours.  See your Physicain for recheck of your blood pressure    ED Prescriptions    None     PDMP  not reviewed this encounter.  An After Visit Summary was printed and given to the patient.    Elson Areas, [place] 01/28/20 6151

## 2020-01-28 NOTE — Discharge Instructions (Addendum)
Use inhaler 2 puffs every 4 hours.  See your Physicain for recheck of your blood pressure

## 2020-01-29 LAB — SARS CORONAVIRUS 2 (TAT 6-24 HRS): SARS Coronavirus 2: NEGATIVE

## 2020-02-01 ENCOUNTER — Other Ambulatory Visit: Payer: Self-pay

## 2020-02-01 ENCOUNTER — Encounter (HOSPITAL_COMMUNITY): Payer: Self-pay

## 2020-02-01 ENCOUNTER — Emergency Department (HOSPITAL_COMMUNITY)
Admission: EM | Admit: 2020-02-01 | Discharge: 2020-02-02 | Disposition: A | Discharge status: Home / Self Care | Payer: Self-pay | Attending: Emergency Medicine | Admitting: Emergency Medicine

## 2020-02-01 DIAGNOSIS — I5021 Acute systolic (congestive) heart failure: Secondary | ICD-10-CM | POA: Insufficient documentation

## 2020-02-01 DIAGNOSIS — Z79899 Other long term (current) drug therapy: Secondary | ICD-10-CM | POA: Insufficient documentation

## 2020-02-01 DIAGNOSIS — M545 Low back pain, unspecified: Secondary | ICD-10-CM

## 2020-02-01 DIAGNOSIS — I110 Hypertensive heart disease with heart failure: Secondary | ICD-10-CM | POA: Insufficient documentation

## 2020-02-01 DIAGNOSIS — F17210 Nicotine dependence, cigarettes, uncomplicated: Secondary | ICD-10-CM | POA: Insufficient documentation

## 2020-02-01 DIAGNOSIS — R109 Unspecified abdominal pain: Secondary | ICD-10-CM | POA: Insufficient documentation

## 2020-02-01 LAB — COMPREHENSIVE METABOLIC PANEL
ALT: 26 U/L (ref 0–44)
AST: 37 U/L (ref 15–41)
Albumin: 4 g/dL (ref 3.5–5.0)
Alkaline Phosphatase: 53 U/L (ref 38–126)
Anion gap: 11 (ref 5–15)
BUN: 17 mg/dL (ref 6–20)
CO2: 27 mmol/L (ref 22–32)
Calcium: 9.5 mg/dL (ref 8.9–10.3)
Chloride: 104 mmol/L (ref 98–111)
Creatinine, Ser: 1.43 mg/dL — ABNORMAL HIGH (ref 0.61–1.24)
GFR calc Af Amer: >60 mL/min (ref >60)
GFR calc non Af Amer: >60 mL/min (ref >60)
Glucose, Bld: 136 mg/dL — ABNORMAL HIGH (ref 70–99)
Potassium: 3.3 mmol/L — ABNORMAL LOW (ref 3.5–5.1)
Sodium: 142 mmol/L (ref 135–145)
Total Bilirubin: 0.7 mg/dL (ref 0.3–1.2)
Total Protein: 7.7 g/dL (ref 6.5–8.1)

## 2020-02-01 LAB — CBC
HCT: 43.8 % (ref 39.0–52.0)
Hemoglobin: 13.6 g/dL (ref 13.0–17.0)
MCH: 27.3 pg (ref 26.0–34.0)
MCHC: 31.1 g/dL (ref 30.0–36.0)
MCV: 88 fL (ref 80.0–100.0)
Platelets: 200 10*3/uL (ref 150–400)
RBC: 4.98 MIL/uL (ref 4.22–5.81)
RDW: 14.1 % (ref 11.5–15.5)
WBC: 7.2 10*3/uL (ref 4.0–10.5)
nRBC: 0 % (ref 0.0–0.2)

## 2020-02-01 LAB — URINALYSIS, ROUTINE W REFLEX MICROSCOPIC
Bilirubin Urine: NEGATIVE
Glucose, UA: NEGATIVE mg/dL
Hgb urine dipstick: NEGATIVE
Ketones, ur: NEGATIVE mg/dL
Leukocytes,Ua: NEGATIVE
Nitrite: NEGATIVE
Protein, ur: 100 mg/dL — AB
Specific Gravity, Urine: 1.021 (ref 1.005–1.030)
pH: 6 (ref 5.0–8.0)

## 2020-02-01 LAB — LIPASE, BLOOD: Lipase: 24 U/L (ref 11–51)

## 2020-02-01 NOTE — ED Triage Notes (Signed)
Patient complains of left side and flank pain x 1 week. Pain worse with movement, denies dysuria.

## 2020-02-02 ENCOUNTER — Emergency Department (HOSPITAL_COMMUNITY): Payer: Self-pay

## 2020-02-02 MED ORDER — CYCLOBENZAPRINE HCL 10 MG PO TABS: 10.0000 mg | ORAL_TABLET | Freq: Two times a day (BID) | ORAL | 0 refills | Status: DC | PRN

## 2020-02-02 MED ORDER — LIDOCAINE 5 % EX PTCH: 1.0000 | MEDICATED_PATCH | CUTANEOUS | 0 refills | Status: DC

## 2020-02-02 MED ORDER — LIDOCAINE 5 % EX PTCH
1.0000 | MEDICATED_PATCH | CUTANEOUS | Status: DC
  Administered 2020-02-02: 1 via TRANSDERMAL
  Filled 2020-02-02: qty 1

## 2020-02-02 MED ORDER — OXYCODONE-ACETAMINOPHEN 5-325 MG PO TABS
2.0000 | ORAL_TABLET | Freq: Once | ORAL | Status: AC
  Administered 2020-02-02: 2 via ORAL
  Filled 2020-02-02: qty 2

## 2020-02-02 NOTE — ED Provider Notes (Signed)
[facility] EMERGENCY DEPARTMENT Provider Note   CSN: [id number] Arrival date & time: 02/01/20  1531     History No chief complaint on file.   Colm Lyford is a 34 y.o. male.  Left-sided flank pain for few weeks.  Patient states he heat pad sometimes helps.  Movement seems to make it worse.  Has history of kidney stones and this feels similar.  No hematuria, dysuria or other urinary changes.   Flank Pain This is a recurrent problem. The current episode started more than 1 week ago. The problem occurs daily. The problem has not changed since onset.Pertinent negatives include no chest pain, no abdominal pain, no headaches and no shortness of breath. Nothing aggravates the symptoms. Nothing relieves the symptoms. He has tried nothing for the symptoms. The treatment provided no relief.       Past Medical History:  Diagnosis Date  . Hypertension   . OSA (obstructive sleep apnea)     Patient Active Problem List   Diagnosis Date Noted  . Educated about COVID-19 virus infection 09/23/2018  . Acute systolic HF (heart failure) (HCC) 09/23/2018  . Hypertensive urgency 09/23/2018  . Chest pain 08/28/2018  . Excessive daytime sleepiness 02/01/2017  . Morning headache 02/01/2017  . Morbid obesity (HCC) 02/01/2017  . Snoring 02/01/2017  . Hypertensive emergency 02/01/2017    Past Surgical History:  Procedure Laterality Date  . NO PAST SURGERIES    . None         Family History  Problem Relation Age of Onset  . Hypertension Mother   . Hypertension Father     Social History   Tobacco Use  . Smoking status: Current Every Day Smoker    Packs/day: 0.50    Types: Cigarettes  . Smokeless tobacco: Current User  Vaping Use  . Vaping Use: Never used  Substance Use Topics  . Alcohol use: Yes    Comment: occ  . Drug use: Yes    Types: Marijuana    Home Medications Prior to Admission medications   Medication Sig Start Date End Date Taking? Authorizing  Provider  amLODipine (NORVASC) 10 MG tablet Take 1 tablet (10 mg total) by mouth daily. 01/04/20   Claiborne Rigg, NP  carvedilol (COREG) 25 MG tablet Take 1 tablet (25 mg total) by mouth 2 (two) times daily with a meal. 01/06/20   Claiborne Rigg, NP  cyclobenzaprine (FLEXERIL) 10 MG tablet Take 1 tablet (10 mg total) by mouth 2 (two) times daily as needed for muscle spasms. 02/02/20   Inga Noller, Barbara Cower, MD  lidocaine (LIDODERM) 5 % Place 1 patch onto the skin daily. Remove & Discard patch within 12 hours or as directed by MD 02/02/20   Zondra Lawlor, Barbara Cower, MD  losartan (COZAAR) 100 MG tablet Take 1 tablet (100 mg total) by mouth daily. 01/06/20   Claiborne Rigg, NP  nitroGLYCERIN (NITROSTAT) 0.4 MG SL tablet Place 1 tablet (0.4 mg total) under the tongue every 5 (five) minutes as needed for chest pain (per CT heart protocol). Patient not taking: Reported on 11/02/2018 08/29/18 02/25/19  Glade Lloyd, MD    Allergies    Bee venom and Shrimp [shellfish allergy]  Review of Systems   Review of Systems  Respiratory: Negative for shortness of breath.   Cardiovascular: Negative for chest pain.  Gastrointestinal: Negative for abdominal pain.  Genitourinary: Positive for flank pain.  Neurological: Negative for headaches.  All other systems reviewed and are negative.   Physical Exam  Updated Vital Signs BP 115/67 (BP Location: Left Arm)   Pulse 72   Temp 98.5 F (36.9 C) (Oral)   Resp 16   Ht 5\' 10"  (1.778 m)   Wt 134.7 kg   SpO2 99%   BMI 42.62 kg/m   Physical Exam Vitals and nursing note reviewed.  Constitutional:      Appearance: He is well-developed.  HENT:     Head: Normocephalic and atraumatic.     Nose: No congestion or rhinorrhea.     Mouth/Throat:     Mouth: Mucous membranes are moist.     Pharynx: Oropharynx is clear. No oropharyngeal exudate.  Eyes:     Pupils: Pupils are equal, round, and reactive to light.  Cardiovascular:     Rate and Rhythm: Normal rate.  Pulmonary:      Effort: Pulmonary effort is normal. No respiratory distress.  Abdominal:     General: There is no distension.  Musculoskeletal:        General: Tenderness (left flank, no rash) present. Normal range of motion.     Cervical back: Normal range of motion.  Skin:    General: Skin is warm and dry.     Coloration: Skin is not jaundiced or pale.  Neurological:     General: No focal deficit present.     Mental Status: He is alert.     ED Results / Procedures / Treatments   Labs (all labs ordered are listed, but only abnormal results are displayed) Labs Reviewed  COMPREHENSIVE METABOLIC PANEL - Abnormal; Notable for the following components:      Result Value   Potassium 3.3 (*)    Glucose, Bld 136 (*)    Creatinine, Ser 1.43 (*)    All other components within normal limits  URINALYSIS, ROUTINE W REFLEX MICROSCOPIC - Abnormal; Notable for the following components:   Protein, ur 100 (*)    Bacteria, UA RARE (*)    All other components within normal limits  LIPASE, BLOOD  CBC    EKG None  Radiology CT Renal Stone Study  Result Date: 02/02/2020 CLINICAL DATA:  Left flank pain for 1 week. History of kidney stones EXAM: CT ABDOMEN AND PELVIS WITHOUT CONTRAST TECHNIQUE: Multidetector CT imaging of the abdomen and pelvis was performed following the standard protocol without IV contrast. COMPARISON:  05/20/2019 FINDINGS: Lower chest: The lung bases are clear. Hepatobiliary: No focal liver abnormality is seen. No gallstones, gallbladder wall thickening, or biliary dilatation. Pancreas: Unremarkable. No pancreatic ductal dilatation or surrounding inflammatory changes. Spleen: Normal in size without focal abnormality. Adrenals/Urinary Tract: No adrenal gland nodules. Kidneys are symmetrical. Cyst in the left kidney as seen on previous contrast-enhanced study. No hydronephrosis or hydroureter. No renal, ureteral, or bladder stones. Bladder wall is not thickened. Stomach/Bowel: Stomach is within  normal limits. Appendix appears normal. No evidence of bowel wall thickening, distention, or inflammatory changes. Vascular/Lymphatic: No significant vascular findings are present. No enlarged abdominal or pelvic lymph nodes. Reproductive: Prostate is unremarkable. Other: No abdominal wall hernia or abnormality. No abdominopelvic ascites. Musculoskeletal: No acute or significant osseous findings. IMPRESSION: No acute process demonstrated in the abdomen or pelvis. No renal or ureteral stone or obstruction. Electronically Signed   By: 05/22/2019 M.D.   On: 02/02/2020 03:09    Procedures Procedures (including critical care time)  Medications Ordered in ED Medications  lidocaine (LIDODERM) 5 % 1 patch (1 patch Transdermal Patch Applied 02/02/20 0325)  oxyCODONE-acetaminophen (PERCOCET/ROXICET) 5-325 MG per tablet 2  tablet (2 tablets Oral Given 02/02/20 0318)    ED Course  I have reviewed the triage vital signs and the nursing notes.  Pertinent labs & imaging results that were available during my care of the patient were reviewed by me and considered in my medical decision making (see chart for details).    MDM Rules/Calculators/A&P                         Rule out kidney stone but seems MSK in nature.  No stone. Will try muscle relaxers, supportive care, lidoderm patches.   Final Clinical Impression(s) / ED Diagnoses Final diagnoses:  Acute left-sided low back pain without sciatica    Rx / DC Orders ED Discharge Orders         Ordered    lidocaine (LIDODERM) 5 %  Every 24 hours        02/02/20 0323    cyclobenzaprine (FLEXERIL) 10 MG tablet  2 times daily PRN        02/02/20 0323           Aariah Godette, Barbara Cower, MD 02/02/20 [phone]

## 2020-02-23 ENCOUNTER — Other Ambulatory Visit: Payer: Self-pay | Admitting: Nurse Practitioner

## 2020-02-23 DIAGNOSIS — I10 Essential (primary) hypertension: Secondary | ICD-10-CM

## 2020-02-23 MED ORDER — CARVEDILOL 25 MG PO TABS: 25.0000 mg | ORAL_TABLET | Freq: Two times a day (BID) | ORAL | 0 refills | Status: DC

## 2020-02-23 MED ORDER — LOSARTAN POTASSIUM 100 MG PO TABS: 100.0000 mg | ORAL_TABLET | Freq: Every day | ORAL | 0 refills | Status: DC

## 2020-02-23 MED FILL — LOSARTAN POTASSIUM 100 MG T: 100 | 30 days supply | Qty: 30 | Fill #0

## 2020-02-23 MED FILL — CARVEDILOL 25 MG TABLET: 25 | 30 days supply | Qty: 60 | Fill #0

## 2020-02-23 NOTE — Telephone Encounter (Signed)
Requested Prescriptions  Pending Prescriptions Disp Refills   losartan (COZAAR) 100 MG tablet 30 tablet 0    Sig: Take 1 tablet (100 mg total) by mouth daily.     Cardiovascular:  Angiotensin Receptor Blockers Failed - 02/23/2020  2:37 PM      Failed - Cr in normal range and within 180 days    Creatinine, Ser  Date Value Ref Range Status  02/01/2020 1.43 (H) 0.61 - 1.24 mg/dL Final         Failed - K in normal range and within 180 days    Potassium  Date Value Ref Range Status  02/01/2020 3.3 (L) 3.5 - 5.1 mmol/L Final         Passed - Patient is not pregnant      Passed - Last BP in normal range    BP Readings from Last 1 Encounters:  02/01/20 115/67         Passed - Valid encounter within last 6 months    Recent Outpatient Visits          1 month ago Essential hypertension   [facility] [place], [place] W, NP   4 months ago Hypertension, unspecified type   [facility] [place], [place] M, [place]              carvedilol (COREG) 25 MG tablet 60 tablet 0    Sig: Take 1 tablet (25 mg total) by mouth 2 (two) times daily with a meal.     Cardiovascular:  Beta Blockers Passed - 02/23/2020  2:37 PM      Passed - Last BP in normal range    BP Readings from Last 1 Encounters:  02/01/20 115/67         Passed - Last Heart Rate in normal range    Pulse Readings from Last 1 Encounters:  02/01/20 72         Passed - Valid encounter within last 6 months    Recent Outpatient Visits          1 month ago Essential hypertension   [place] [facility] [place], Shea Stakes, NP   4 months ago Hypertension, unspecified type   [facility] [place], Marzella Schlein, [place]              Phone call to pt.  Voice mailbox full; unable to notify pt. Of need to schedule f/u appt. For BP recheck/ management.  Courtesy refills given x 1 mo.

## 2020-02-23 NOTE — Telephone Encounter (Signed)
Medication Refill - Medication: carvedilol (COREG) 25 MG tablet  losartan (COZAAR) 100 MG tablet    Has the patient contacted their pharmacy? No. (Agent: If no, request that the patient contact the pharmacy for the refill.) (Agent: If yes, when and what did the pharmacy advise?)  Preferred Pharmacy (with phone number or street name): [facility]   Agent: Please be advised that RX refills may take up to 3 business days. We ask that you follow-up with your pharmacy.

## 2020-03-17 MED FILL — CARVEDILOL 25 MG TABLET: 25 | 30 days supply | Qty: 60 | Fill #0

## 2020-03-17 MED FILL — AMLODIPINE BESYLATE 10 MG T: 10 | 30 days supply | Qty: 30 | Fill #1

## 2020-03-17 MED FILL — LOSARTAN POTASSIUM 100 MG T: 100 | 30 days supply | Qty: 30 | Fill #0

## 2020-04-20 ENCOUNTER — Other Ambulatory Visit: Payer: Self-pay | Admitting: Nurse Practitioner

## 2020-04-20 DIAGNOSIS — I10 Essential (primary) hypertension: Secondary | ICD-10-CM

## 2020-04-20 NOTE — Telephone Encounter (Signed)
Requested medication (s) are due for refill today:  Yes  Requested medication (s) are on the active medication list:  Yes  Future visit scheduled:  Yes  Last Refill: 02/23/20; 30 day courtesy refill  Notes to clinic: pt. Last seen 12/2019; was to f/u in 2 weeks with [facility] for BP check, and did not do so.  Attempted to call; number not in service.   Requested Prescriptions  Pending Prescriptions Disp Refills   losartan (COZAAR) 100 MG tablet 30 tablet 0    Sig: Take 1 tablet (100 mg total) by mouth daily.      Cardiovascular:  Angiotensin Receptor Blockers Failed - 04/20/2020  2:12 PM      Failed - Cr in normal range and within 180 days    Creatinine, Ser  Date Value Ref Range Status  02/01/2020 1.43 (H) 0.61 - 1.24 mg/dL Final          Failed - K in normal range and within 180 days    Potassium  Date Value Ref Range Status  02/01/2020 3.3 (L) 3.5 - 5.1 mmol/L Final          Passed - Patient is not pregnant      Passed - Last BP in normal range    BP Readings from Last 1 Encounters:  02/01/20 115/67          Passed - Valid encounter within last 6 months    Recent Outpatient Visits           3 months ago Essential hypertension   [place] [facility] [place], [place] W, NP   6 months ago Hypertension, unspecified type   [facility] [place], Marzella Schlein, [place]       Future Appointments             In 1 month Claiborne Rigg, NP [facility]               carvedilol (COREG) 25 MG tablet 60 tablet 0    Sig: Take 1 tablet (25 mg total) by mouth 2 (two) times daily with a meal.      Cardiovascular:  Beta Blockers Passed - 04/20/2020  2:12 PM      Passed - Last BP in normal range    BP Readings from Last 1 Encounters:  02/01/20 115/67          Passed - Last Heart Rate in normal range    Pulse Readings from Last 1 Encounters:  02/01/20 72          Passed - Valid encounter  within last 6 months    Recent Outpatient Visits           3 months ago Essential hypertension   [facility] [place], Shea Stakes, NP   6 months ago Hypertension, unspecified type   [facility] [place], [place], [place]       Future Appointments             In 1 month Claiborne Rigg, NP [facility]              Refused Prescriptions Disp Refills   amLODipine (NORVASC) 10 MG tablet 90 tablet 3    Sig: Take 1 tablet (10 mg total) by mouth daily.      Cardiovascular:  Calcium Channel Blockers Passed - 04/20/2020  2:12 PM  Passed - Last BP in normal range    BP Readings from Last 1 Encounters:  02/01/20 115/67          Passed - Valid encounter within last 6 months    Recent Outpatient Visits           3 months ago Essential hypertension   [place] [facility] [place], Shea Stakes, NP   6 months ago Hypertension, unspecified type   [facility] [place], Marzella Schlein, [place]       Future Appointments             In 1 month Claiborne Rigg, NP [facility]

## 2020-04-20 NOTE — Telephone Encounter (Signed)
Requested Prescriptions  Pending Prescriptions Disp Refills  . losartan (COZAAR) 100 MG tablet 30 tablet 0    Sig: Take 1 tablet (100 mg total) by mouth daily.     Cardiovascular:  Angiotensin Receptor Blockers Failed - 04/20/2020  2:12 PM      Failed - Cr in normal range and within 180 days    Creatinine, Ser  Date Value Ref Range Status  02/01/2020 1.43 (H) 0.61 - 1.24 mg/dL Final         Failed - K in normal range and within 180 days    Potassium  Date Value Ref Range Status  02/01/2020 3.3 (L) 3.5 - 5.1 mmol/L Final         Passed - Patient is not pregnant      Passed - Last BP in normal range    BP Readings from Last 1 Encounters:  02/01/20 115/67         Passed - Valid encounter within last 6 months    Recent Outpatient Visits          3 months ago Essential hypertension   [facility] [place], Shea Stakes, NP   6 months ago Hypertension, unspecified type   [facility] [place], Marzella Sch[place], [place]      Future Appointments            In 1 month Claiborne Rigg, NP [facility]           . carvedilol (COREG) 25 MG tablet 60 tablet 0    Sig: Take 1 tablet (25 mg total) by mouth 2 (two) times daily with a meal.     Cardiovascular:  Beta Blockers Passed - 04/20/2020  2:12 PM      Passed - Last BP in normal range    BP Readings from Last 1 Encounters:  02/01/20 115/67         Passed - Last Heart Rate in normal range    Pulse Readings from Last 1 Encounters:  02/01/20 72         Passed - Valid encounter within last 6 months    Recent Outpatient Visits          3 months ago Essential hypertension   [place] [facility] [place], Shea Stakes, NP   6 months ago Hypertension, unspecified type   [facility] [place], Marzella Schlein, [place]      Future Appointments            In 1 month Claiborne Rigg, NP [facility]           . amLODipine (NORVASC) 10 MG tablet 90 tablet 3    Sig: Take 1 tablet (10 mg total) by mouth daily.     Cardiovascular:  Calcium Channel Blockers Passed - 04/20/2020  2:12 PM      Passed - Last BP in normal range    BP Readings from Last 1 Encounters:  02/01/20 115/67         Passed - Valid encounter within last 6 months    Recent Outpatient Visits          3 months ago Essential hypertension    [facility] [place], Shea Stakes, NP   6 months ago Hypertension, unspecified type   [facility] [place], [place], [place]  Future Appointments            In 1 month Claiborne Rigg, NP [facility]

## 2020-04-20 NOTE — Telephone Encounter (Signed)
Medication: losartan (COZAAR) 100 MG tablet [[id number]] , carvedilol (COREG) 25 MG tablet [[id number]] , amLODipine (NORVASC) 10 MG tablet [[id number]] apt 06/12/20  Has the patient contacted their pharmacy? YES  (Agent: If no, request that the patient contact the pharmacy for the refill.) (Agent: If yes, when and what did the pharmacy advise?)  Preferred Pharmacy (with phone number or street name): [facility] - [place], [place] - [place] [address] [place] [address] Phone: [phone] Fax: [phone] Hours: Not open 24 hours    Agent: Please be advised that RX refills may take up to 3 business days. We ask that you follow-up with your pharmacy.

## 2020-04-21 MED FILL — LOSARTAN POTASSIUM 100 MG T: 100 | 30 days supply | Qty: 30 | Fill #0

## 2020-04-21 MED FILL — CARVEDILOL 25 MG TABLET: 25 | 30 days supply | Qty: 60 | Fill #0

## 2020-04-21 MED FILL — AMLODIPINE BESYLATE 10 MG T: 10 | 30 days supply | Qty: 30 | Fill #2

## 2020-04-24 MED ORDER — LOSARTAN POTASSIUM 100 MG PO TABS: 100.0000 mg | ORAL_TABLET | Freq: Every day | ORAL | 1 refills | Status: DC

## 2020-04-24 MED ORDER — CARVEDILOL 25 MG PO TABS: 25.0000 mg | ORAL_TABLET | Freq: Two times a day (BID) | ORAL | 1 refills | Status: DC

## 2020-06-01 ENCOUNTER — Other Ambulatory Visit (HOSPITAL_COMMUNITY): Payer: Self-pay | Admitting: Emergency Medicine

## 2020-06-01 ENCOUNTER — Ambulatory Visit (HOSPITAL_COMMUNITY)
Admission: EM | Admit: 2020-06-01 | Discharge: 2020-06-01 | Disposition: A | Discharge status: Home / Self Care | Payer: Self-pay | Attending: Emergency Medicine | Admitting: Emergency Medicine

## 2020-06-01 ENCOUNTER — Encounter (HOSPITAL_COMMUNITY): Payer: Self-pay

## 2020-06-01 DIAGNOSIS — R1032 Left lower quadrant pain: Secondary | ICD-10-CM | POA: Insufficient documentation

## 2020-06-01 DIAGNOSIS — R102 Pelvic and perineal pain: Secondary | ICD-10-CM | POA: Insufficient documentation

## 2020-06-01 DIAGNOSIS — R300 Dysuria: Secondary | ICD-10-CM | POA: Insufficient documentation

## 2020-06-01 LAB — CBC WITH DIFFERENTIAL/PLATELET
Abs Immature Granulocytes: 0.04 10*3/uL (ref 0.00–0.07)
Basophils Absolute: 0.1 10*3/uL (ref 0.0–0.1)
Basophils Relative: 0 %
Eosinophils Absolute: 0.2 10*3/uL (ref 0.0–0.5)
Eosinophils Relative: 2 %
HCT: 45.9 % (ref 39.0–52.0)
Hemoglobin: 14.2 g/dL (ref 13.0–17.0)
Immature Granulocytes: 0 %
Lymphocytes Relative: 17 %
Lymphs Abs: 2 10*3/uL (ref 0.7–4.0)
MCH: 27.6 pg (ref 26.0–34.0)
MCHC: 30.9 g/dL (ref 30.0–36.0)
MCV: 89.1 fL (ref 80.0–100.0)
Monocytes Absolute: 1 10*3/uL (ref 0.1–1.0)
Monocytes Relative: 9 %
Neutro Abs: 8.6 10*3/uL — ABNORMAL HIGH (ref 1.7–7.7)
Neutrophils Relative %: 72 %
Platelets: 201 10*3/uL (ref 150–400)
RBC: 5.15 MIL/uL (ref 4.22–5.81)
RDW: 14.9 % (ref 11.5–15.5)
WBC: 11.9 10*3/uL — ABNORMAL HIGH (ref 4.0–10.5)
nRBC: 0 % (ref 0.0–0.2)

## 2020-06-01 LAB — POCT URINALYSIS DIPSTICK, ED / UC
Bilirubin Urine: NEGATIVE
Glucose, UA: NEGATIVE mg/dL
Hgb urine dipstick: NEGATIVE
Leukocytes,Ua: NEGATIVE
Nitrite: NEGATIVE
Protein, ur: 30 mg/dL — AB
Specific Gravity, Urine: 1.025 (ref 1.005–1.030)
Urobilinogen, UA: 0.2 mg/dL (ref 0.0–1.0)
pH: 5.5 (ref 5.0–8.0)

## 2020-06-01 MED ORDER — CIPROFLOXACIN HCL 500 MG PO TABS: 500.0000 mg | ORAL_TABLET | Freq: Two times a day (BID) | ORAL | 0 refills | Status: DC

## 2020-06-01 MED ORDER — METRONIDAZOLE 500 MG PO TABS: 500.0000 mg | ORAL_TABLET | Freq: Three times a day (TID) | ORAL | 0 refills | Status: DC

## 2020-06-01 MED FILL — metroNIDAZOLE 500 MG TABS: 500 | 10 days supply | Qty: 30 | Fill #0

## 2020-06-01 MED FILL — CIPROFLOXACIN HCL 500 MG TA: 500 | 10 days supply | Qty: 20 | Fill #0

## 2020-06-01 NOTE — ED Triage Notes (Addendum)
Pt presents with abdominal pain starting today. Pt states he is not nauseated. Patient stated he has had a bm today. Both RLQ and LLQ pain. Pain on palpitation

## 2020-06-01 NOTE — Discharge Instructions (Addendum)
I am starting antibiotics for diverticulitis.  We are checking some basic labs as well to further evaluate this.  We will notify of you any positive findings from your penis testing, or if any changes to treatment are needed. If normal or otherwise without concern to your results, we will not call you. Please log on to your MyChart to review your results if interested in so.   If worsening of symptoms or no improvement in the next few days please go to the ER.  Please follow up with your PCP in the next two weeks for BP recheck.

## 2020-06-01 NOTE — ED Provider Notes (Signed)
[facility]    CSN: [id number] Arrival date & time: 06/01/20  1221      History   Chief Complaint Chief Complaint  Patient presents with  . Abdominal Pain    HPI Dale Mcintosh is a 35 y.o. male.   Dale Mcintosh presents with complaints of low abdominal pain, bilaterally. No back pain. No blood in urine, no urinary frequency. Also with pain with urination to urethra as well as testicles. Otherwise no testicular pain, redness or swelling.  He states he does have to strain to pass stool but does have a stool daily. History of hypertension, he takes his blood pressure medication as prescribed. He doesn't check his blood pressure at home. He is interested in screening for stds, no known exposure. 2 partners in the past 6 months, doesn't use condoms. No history of colonoscopy. He states he has had similar abdominal pain in the past and had an "infection" which it sounds like he is referred to diverticulitis. On chart review history of diverticulitis 05/20/19 as well as nephrolithiasis. States he has an appointment with his PCP 2/7   ROS per HPI, negative if not otherwise mentioned.      Past Medical History:  Diagnosis Date  . Hypertension   . OSA (obstructive sleep apnea)     Patient Active Problem List   Diagnosis Date Noted  . Educated about COVID-19 virus infection 09/23/2018  . Acute systolic HF (heart failure) (HCC) 09/23/2018  . Hypertensive urgency 09/23/2018  . Chest pain 08/28/2018  . Excessive daytime sleepiness 02/01/2017  . Morning headache 02/01/2017  . Morbid obesity (HCC) 02/01/2017  . Snoring 02/01/2017  . Hypertensive emergency 02/01/2017    Past Surgical History:  Procedure Laterality Date  . NO PAST SURGERIES    . None         Home Medications    Prior to Admission medications   Medication Sig Start Date End Date Taking? Authorizing Provider  ciprofloxacin (CIPRO) 500 MG tablet Take 1 tablet (500 mg total) by mouth 2 (two) times  daily for 10 days. 06/01/20 06/11/20 Yes Burky, Barron Alvine, NP  metroNIDAZOLE (FLAGYL) 500 MG tablet Take 1 tablet (500 mg total) by mouth 3 (three) times daily for 10 days. 06/01/20 06/11/20 Yes Burky, Dorene Grebe B, NP  amLODipine (NORVASC) 10 MG tablet Take 1 tablet (10 mg total) by mouth daily. 01/04/20   Claiborne Rigg, NP  carvedilol (COREG) 25 MG tablet Take 1 tablet (25 mg total) by mouth 2 (two) times daily with a meal. 04/24/20   Hoy Register, MD  cyclobenzaprine (FLEXERIL) 10 MG tablet Take 1 tablet (10 mg total) by mouth 2 (two) times daily as needed for muscle spasms. 02/02/20   Mesner, Barbara Cower, MD  lidocaine (LIDODERM) 5 % Place 1 patch onto the skin daily. Remove & Discard patch within 12 hours or as directed by MD 02/02/20   Mesner, Barbara Cower, MD  losartan (COZAAR) 100 MG tablet Take 1 tablet (100 mg total) by mouth daily. 04/24/20   Hoy Register, MD  nitroGLYCERIN (NITROSTAT) 0.4 MG SL tablet Place 1 tablet (0.4 mg total) under the tongue every 5 (five) minutes as needed for chest pain (per CT heart protocol). Patient not taking: Reported on 11/02/2018 08/29/18 02/25/19  Glade Lloyd, MD    Family History Family History  Problem Relation Age of Onset  . Hypertension Mother   . Hypertension Father   . Diabetes Father     Social History Social History   Tobacco  Use  . Smoking status: Current Every Day Smoker    Packs/day: 0.50    Types: Cigarettes  . Smokeless tobacco: Current User  Vaping Use  . Vaping Use: Never used  Substance Use Topics  . Alcohol use: Yes    Comment: occ  . Drug use: Yes    Types: Marijuana    Comment: daily     Allergies   Bee venom and Shrimp [shellfish allergy]   Review of Systems Review of Systems   Physical Exam Triage Vital Signs ED Triage Vitals  Enc Vitals Group     BP 06/01/20 1310 (!) 182/118     Pulse Rate 06/01/20 1310 78     Resp 06/01/20 1307 17     Temp 06/01/20 1307 98.9 F (37.2 C)     Temp Source 06/01/20 1307 Oral      SpO2 06/01/20 1307 100 %     Weight 06/01/20 1304 298 lb (135.2 kg)     Height 06/01/20 1304 5' 10.5" (1.791 m)     Head Circumference --      Peak Flow --      Pain Score 06/01/20 1304 10     Pain Loc --      Pain Edu? --      Excl. in GC? --    No data found.  Updated Vital Signs BP (!) 182/118 Comment: pt just took bp med 1 hour ago.  Pulse 78   Temp 98.9 F (37.2 C) (Oral)   Resp 17   Ht 5' 10.5" (1.791 m)   Wt 298 lb (135.2 kg)   SpO2 100%   BMI 42.15 kg/m   Visual Acuity Right Eye Distance:   Left Eye Distance:   Bilateral Distance:    Right Eye Near:   Left Eye Near:    Bilateral Near:     Physical Exam Exam conducted with a chaperone present.  Constitutional:      Appearance: He is well-developed.  Cardiovascular:     Rate and Rhythm: Normal rate.  Pulmonary:     Effort: Pulmonary effort is normal.  Abdominal:     Palpations: Abdomen is soft.     Tenderness: There is abdominal tenderness in the suprapubic area and left lower quadrant. There is no right CVA tenderness or left CVA tenderness.  Genitourinary:    Penis: Normal and uncircumcised.      Testes: Normal.        Right: Tenderness or swelling not present.        Left: Tenderness or swelling not present.  Skin:    General: Skin is warm and dry.  Neurological:     Mental Status: He is alert and oriented to person, place, and time.      UC Treatments / Results  Labs (all labs ordered are listed, but only abnormal results are displayed) Labs Reviewed  POCT URINALYSIS DIPSTICK, ED / UC - Abnormal; Notable for the following components:      Result Value   Ketones, ur TRACE (*)    Protein, ur 30 (*)    All other components within normal limits  CBC WITH DIFFERENTIAL/PLATELET  CYTOLOGY, (ORAL, ANAL, URETHRAL) ANCILLARY ONLY    EKG   Radiology No results found.  Procedures Procedures (including critical care time)  Medications Ordered in UC Medications - No data to  display  Initial Impression / Assessment and Plan / UC Course  I have reviewed the triage vital signs and the nursing notes.  Pertinent labs & imaging results that were available during my care of the patient were reviewed by me and considered in my medical decision making (see chart for details).     Concern for diverticulitis on exam today. Cbc pending. antibiotics initiated. Penile cytology pending as well. Benign exam of genitalia. Strict er precautions discussed. Encouraged follow up with PCP for bp recheck. Patient verbalized understanding and agreeable to plan.   Final Clinical Impressions(s) / UC Diagnoses   Final diagnoses:  Abdominal pain, left lower quadrant  Suprapubic pain  Dysuria     Discharge Instructions     I am starting antibiotics for diverticulitis.  We are checking some basic labs as well to further evaluate this.  We will notify of you any positive findings from your penis testing, or if any changes to treatment are needed. If normal or otherwise without concern to your results, we will not call you. Please log on to your MyChart to review your results if interested in so.   If worsening of symptoms or no improvement in the next few days please go to the ER.  Please follow up with your PCP in the next two weeks for BP recheck.     ED Prescriptions    Medication Sig Dispense Auth. Provider   metroNIDAZOLE (FLAGYL) 500 MG tablet Take 1 tablet (500 mg total) by mouth 3 (three) times daily for 10 days. 30 tablet Linus Mako B, NP   ciprofloxacin (CIPRO) 500 MG tablet Take 1 tablet (500 mg total) by mouth 2 (two) times daily for 10 days. 20 tablet Georgetta Haber, NP     PDMP not reviewed this encounter.   Georgetta Haber, NP 06/01/20 [phone]

## 2020-06-01 NOTE — ED Triage Notes (Signed)
Pt reports dysuria and testicular pain x 1 month. Requesting STD testing.   Denies fever, hematuria.

## 2020-06-02 LAB — CYTOLOGY, (ORAL, ANAL, URETHRAL) ANCILLARY ONLY
Chlamydia: NEGATIVE
Comment: NEGATIVE
Comment: NEGATIVE
Comment: NORMAL
Neisseria Gonorrhea: NEGATIVE
Trichomonas: NEGATIVE

## 2020-06-12 ENCOUNTER — Ambulatory Visit: Payer: Self-pay | Admitting: Nurse Practitioner

## 2020-06-12 ENCOUNTER — Other Ambulatory Visit: Payer: Self-pay

## 2020-06-26 ENCOUNTER — Other Ambulatory Visit: Payer: Self-pay

## 2020-06-26 ENCOUNTER — Encounter: Payer: Self-pay | Admitting: Family

## 2020-06-26 ENCOUNTER — Ambulatory Visit (INDEPENDENT_AMBULATORY_CARE_PROVIDER_SITE_OTHER): Payer: Self-pay | Admitting: Family

## 2020-06-26 DIAGNOSIS — I10 Essential (primary) hypertension: Secondary | ICD-10-CM

## 2020-06-26 MED ORDER — CLONIDINE HCL 0.2 MG PO TABS: 0.2000 mg | ORAL_TABLET | Freq: Once | ORAL | Status: DC

## 2020-06-26 MED ORDER — LOSARTAN POTASSIUM 100 MG PO TABS: 100.0000 mg | ORAL_TABLET | Freq: Every day | ORAL | 0 refills | Status: DC

## 2020-06-26 MED ORDER — HYDROCHLOROTHIAZIDE 25 MG PO TABS: 25.0000 mg | ORAL_TABLET | Freq: Every day | ORAL | 0 refills | Status: DC

## 2020-06-26 MED ORDER — CARVEDILOL 25 MG PO TABS: 25.0000 mg | ORAL_TABLET | Freq: Two times a day (BID) | ORAL | 2 refills | Status: DC

## 2020-06-26 MED ORDER — AMLODIPINE BESYLATE 10 MG PO TABS: 10.0000 mg | ORAL_TABLET | Freq: Every day | ORAL | 0 refills | Status: DC

## 2020-06-26 NOTE — Progress Notes (Signed)
Patient ID: Dale Mcintosh, male    DOB: 24-Dec-1985  MRN: [id number]  CC: Hypertension Follow-Up  Subjective: Dale Mcintosh is a 35 y.o. male who presents for hypertension follow-up.  1. HYPERTENSION FOLLOW-UP: Visit 01/04/2020 at [facility] per NP note: -     cloNIDine (CATAPRES) tablet 0.2 mg -     amLODipine (NORVASC) 10 MG tablet; Take 1 tablet (10 mg total) by mouth daily. Continue all antihypertensives as prescribed.  Remember to bring in your blood pressure log with you for your follow up appointment.  DASH/Mediterranean Diets are healthier choices for HTN.   06/26/2020: Currently taking: see medication list Have you taken your blood pressure medication today: [x]  Yes []  No  Med Adherence: []  Yes    [x]  No, reports sometimes he forgets to take the second dose of Carvedilol Medication side effects: []  Yes    [x]  No Adherence with salt restriction (low-salt diet): []  Yes    [x]  No Exercise: Yes []  No [x]  Home Monitoring?: []  Yes    [x]  No Monitoring Frequency: []  Yes    [x]  No Home BP results range: []  Yes    [x]  No Smoking [x]  Yes []  No SOB? []  Yes    [x]  No Chest Pain?: []  Yes    [x]  No Leg swelling?: []  Yes    [x]  No Headaches?: [x]  Yes    []  No Dizziness? []  Yes    [x]  No   Vitals with BMI 06/26/2020 06/01/2020 02/01/2020  Height - 5' 10.5" -  Weight 299 lbs 10 oz 298 lbs -  BMI - 42.14 -  Systolic 198 182  Diastolic 132 118 67  Pulse 80 78 72    Patient Active Problem List   Diagnosis Date Noted  . Educated about COVID-19 virus infection 09/23/2018  . Acute systolic HF (heart failure) (HCC) 09/23/2018  . Hypertensive urgency 09/23/2018  . Chest pain 08/28/2018  . Excessive daytime sleepiness 02/01/2017  . Morning headache 02/01/2017  . Morbid obesity (HCC) 02/01/2017  . Snoring 02/01/2017  . Hypertensive emergency 02/01/2017     Current Outpatient Medications on File Prior to Visit  Medication Sig Dispense Refill  .  [DISCONTINUED] nitroGLYCERIN (NITROSTAT) 0.4 MG SL tablet Place 1 tablet (0.4 mg total) under the tongue every 5 (five) minutes as needed for chest pain (per CT heart protocol). (Patient not taking: Reported on 11/02/2018) 30 tablet 0   No current facility-administered medications on file prior to visit.    Allergies  Allergen Reactions  . Bee Venom Anaphylaxis  . Shrimp [Shellfish Allergy] Itching    Social History   Socioeconomic History  . Marital status: Significant Other    Spouse name: Not on file  . Number of children: Not on file  . Years of education: Not on file  . Highest education level: Not on file  Occupational History  . Not on file  Tobacco Use  . Smoking status: Current Every Day Smoker    Packs/day: 0.50    Types: Cigarettes  . Smokeless tobacco: Current User  Vaping Use  . Vaping Use: Never used  Substance and Sexual Activity  . Alcohol use: Yes    Comment: occ  . Drug use: Yes    Types: Marijuana    Comment: daily  . Sexual activity: Yes  Other Topics Concern  . Not on file  Social History Narrative  . Not on file   Social Determinants of Health   Financial Resource  Strain: Not on file  Food Insecurity: Not on file  Transportation Needs: Not on file  Physical Activity: Not on file  Stress: Not on file  Social Connections: Not on file  Intimate Partner Violence: Not on file    Family History  Problem Relation Age of Onset  . Hypertension Mother   . Hypertension Father   . Diabetes Father     Past Surgical History:  Procedure Laterality Date  . NO PAST SURGERIES    . None      ROS: Review of Systems Negative except as stated above  PHYSICAL EXAM: BP (!) 198/132 (BP Location: Left Arm, Patient Position: Sitting)   Pulse 80   Wt 299 lb 9.6 oz (135.9 kg)   SpO2 98%   BMI 42.38 kg/m   Physical Exam Constitutional:      Appearance: He is obese.  HENT:     Head: Normocephalic and atraumatic.  Eyes:     Extraocular Movements:  Extraocular movements intact.     Pupils: Pupils are equal, round, and reactive to light.  Cardiovascular:     Rate and Rhythm: Normal rate and regular rhythm.     Pulses: Normal pulses.     Heart sounds: Normal heart sounds.  Pulmonary:     Effort: Pulmonary effort is normal.     Breath sounds: Normal breath sounds.  Musculoskeletal:     Cervical back: Normal range of motion and neck supple.  Neurological:     General: No focal deficit present.     Mental Status: He is alert and oriented to person, place, and time.  Psychiatric:        Mood and Affect: Mood normal.        Behavior: Behavior normal.      ASSESSMENT AND PLAN: 1. Essential hypertension: - Blood pressure not at goal during today's visit. Patient asymptomatic without chest pressure, chest pain, palpitations, shortness of breath, and worst headache of life. - Clonidine administered in clinic.  - Continue Amlodipine 10 mg daily, Carvedilol 25 mg twice daly, and Losartan 100 mg daily as prescribed.  - Adding Hydrochlorothiazide 25 mg daily.  - Follow-up in 1 week or sooner if needed with primary provider for blood pressure check. - Counseled on blood pressure goal of less than 130/80, low-sodium, DASH diet, medication compliance, 150 minutes of moderate intensity exercise per week as tolerated. Discussed medication compliance, adverse effects. - cloNIDine (CATAPRES) tablet 0.2 mg   Patient was given the opportunity to ask questions.  Patient verbalized understanding of the plan and was able to repeat key elements of the plan. Patient was given clear instructions to go to Emergency Department or return to medical center if symptoms don't improve, worsen, or new problems develop.The patient verbalized understanding.   Return in about 1 week (around 07/03/2020) for Follow-Up Bertram Denver, NP.  Rema Fendt, NP

## 2020-06-26 NOTE — Patient Instructions (Addendum)
Continue Amlodipine, Carvedilol, and Losartan.   Adding Hydrochlorothiazide for high blood pressure.   Blood pressure check-up in 2 weeks or sooner if needed. Write down the date and blood pressure reading and bring to appointment in 2 weeks.   Hypertension, Adult Hypertension is another name for high blood pressure. High blood pressure forces your heart to work harder to pump blood. This can cause problems over time. There are two numbers in a blood pressure reading. There is a top number (systolic) over a bottom number (diastolic). It is best to have a blood pressure that is below 120/80. Healthy choices can help lower your blood pressure, or you may need medicine to help lower it. What are the causes? The cause of this condition is not known. Some conditions may be related to high blood pressure. What increases the risk?  Smoking.  Having type 2 diabetes mellitus, high cholesterol, or both.  Not getting enough exercise or physical activity.  Being overweight.  Having too much fat, sugar, calories, or salt (sodium) in your diet.  Drinking too much alcohol.  Having long-term (chronic) kidney disease.  Having a family history of high blood pressure.  Age. Risk increases with age.  Race. You may be at higher risk if you are African American.  Gender. Men are at higher risk than women before age 61. After age 74, women are at higher risk than men.  Having obstructive sleep apnea.  Stress. What are the signs or symptoms?  High blood pressure may not cause symptoms. Very high blood pressure (hypertensive crisis) may cause: ? Headache. ? Feelings of worry or nervousness (anxiety). ? Shortness of breath. ? Nosebleed. ? A feeling of being sick to your stomach (nausea). ? Throwing up (vomiting). ? Changes in how you see. ? Very bad chest pain. ? Seizures. How is this treated?  This condition is treated by making healthy lifestyle changes, such as: ? Eating healthy  foods. ? Exercising more. ? Drinking less alcohol.  Your health care provider may prescribe medicine if lifestyle changes are not enough to get your blood pressure under control, and if: ? Your top number is above 130. ? Your bottom number is above 80.  Your personal target blood pressure may vary. Follow these instructions at home: Eating and drinking  If told, follow the DASH eating plan. To follow this plan: ? Fill one half of your plate at each meal with fruits and vegetables. ? Fill one fourth of your plate at each meal with whole grains. Whole grains include whole-wheat pasta, brown rice, and whole-grain bread. ? Eat or drink low-fat dairy products, such as skim milk or low-fat yogurt. ? Fill one fourth of your plate at each meal with low-fat (lean) proteins. Low-fat proteins include fish, chicken without skin, eggs, beans, and tofu. ? Avoid fatty meat, cured and processed meat, or chicken with skin. ? Avoid pre-made or processed food.  Eat less than 1,500 mg of salt each day.  Do not drink alcohol if: ? Your doctor tells you not to drink. ? You are pregnant, may be pregnant, or are planning to become pregnant.  If you drink alcohol: ? Limit how much you use to:  0-1 drink a day for women.  0-2 drinks a day for men. ? Be aware of how much alcohol is in your drink. In the [place] one drink equals one 12 oz bottle of beer (355 mL), one 5 oz glass of wine (148 mL), or one 1 oz  glass of hard liquor (44 mL).   Lifestyle  Work with your doctor to stay at a healthy weight or to lose weight. Ask your doctor what the best weight is for you.  Get at least 30 minutes of exercise most days of the week. This may include walking, swimming, or biking.  Get at least 30 minutes of exercise that strengthens your muscles (resistance exercise) at least 3 days a week. This may include lifting weights or doing Pilates.  Do not use any products that contain nicotine or tobacco, such as  cigarettes, e-cigarettes, and chewing tobacco. If you need help quitting, ask your doctor.  Check your blood pressure at home as told by your doctor.  Keep all follow-up visits as told by your doctor. This is important.   Medicines  Take over-the-counter and prescription medicines only as told by your doctor. Follow directions carefully.  Do not skip doses of blood pressure medicine. The medicine does not work as well if you skip doses. Skipping doses also puts you at risk for problems.  Ask your doctor about side effects or reactions to medicines that you should watch for. Contact a doctor if you:  Think you are having a reaction to the medicine you are taking.  Have headaches that keep coming back (recurring).  Feel dizzy.  Have swelling in your ankles.  Have trouble with your vision. Get help right away if you:  Get a very bad headache.  Start to feel mixed up (confused).  Feel weak or numb.  Feel faint.  Have very bad pain in your: ? Chest. ? Belly (abdomen).  Throw up more than once.  Have trouble breathing. Summary  Hypertension is another name for high blood pressure.  High blood pressure forces your heart to work harder to pump blood.  For most people, a normal blood pressure is less than 120/80.  Making healthy choices can help lower blood pressure. If your blood pressure does not get lower with healthy choices, you may need to take medicine. This information is not intended to replace advice given to you by your health care provider. Make sure you discuss any questions you have with your health care provider. Document Revised: 12/31/2017 Document Reviewed: 12/31/2017 Elsevier Patient Education  2021 [employer].

## 2020-06-26 NOTE — Progress Notes (Signed)
Needs refill on all meds

## 2020-06-27 ENCOUNTER — Other Ambulatory Visit: Payer: Self-pay

## 2020-06-27 DIAGNOSIS — I10 Essential (primary) hypertension: Secondary | ICD-10-CM

## 2020-06-27 MED ORDER — AMLODIPINE BESYLATE 10 MG PO TABS: 10.0000 mg | ORAL_TABLET | Freq: Every day | ORAL | 0 refills | Status: DC

## 2020-06-27 MED ORDER — CARVEDILOL 25 MG PO TABS: 25.0000 mg | ORAL_TABLET | Freq: Two times a day (BID) | ORAL | 2 refills | Status: DC

## 2020-06-27 MED ORDER — LOSARTAN POTASSIUM 100 MG PO TABS: 100.0000 mg | ORAL_TABLET | Freq: Every day | ORAL | 0 refills | Status: DC

## 2020-06-27 MED ORDER — HYDROCHLOROTHIAZIDE 25 MG PO TABS: 25.0000 mg | ORAL_TABLET | Freq: Every day | ORAL | 0 refills | Status: DC

## 2020-06-27 MED FILL — LOSARTAN POTASSIUM 100 MG T: 100 | 30 days supply | Qty: 30 | Fill #0

## 2020-06-27 MED FILL — CARVEDILOL 25 MG TABLET: 25 | 30 days supply | Qty: 60 | Fill #0

## 2020-06-27 MED FILL — HYDROCHLOROTHIAZIDE 25 MG T: 25 | 30 days supply | Qty: 30 | Fill #0

## 2020-06-27 MED FILL — AMLODIPINE BESYLATE 10 MG T: 10 | 30 days supply | Qty: 30 | Fill #0

## 2020-06-27 NOTE — Progress Notes (Signed)
Reordered prescriptions for pt

## 2020-07-03 ENCOUNTER — Encounter: Payer: Self-pay | Admitting: Nurse Practitioner

## 2020-07-03 ENCOUNTER — Ambulatory Visit: Payer: Self-pay | Attending: Nurse Practitioner | Admitting: Nurse Practitioner

## 2020-07-03 ENCOUNTER — Other Ambulatory Visit: Payer: Self-pay

## 2020-07-03 DIAGNOSIS — Z6841 Body Mass Index (BMI) 40.0 and over, adult: Secondary | ICD-10-CM

## 2020-07-03 DIAGNOSIS — D72829 Elevated white blood cell count, unspecified: Secondary | ICD-10-CM

## 2020-07-03 DIAGNOSIS — R7309 Other abnormal glucose: Secondary | ICD-10-CM

## 2020-07-03 DIAGNOSIS — I10 Essential (primary) hypertension: Secondary | ICD-10-CM

## 2020-07-03 NOTE — Progress Notes (Signed)
Virtual Visit via Telephone Note Due to national recommendations of social distancing due to Dale Mcintosh, telehealth visit is felt to be most appropriate for this patient at this time.  I discussed the limitations, risks, security and privacy concerns of performing an evaluation and management service by telephone and the availability of in person appointments. I also discussed with the patient that there may be a patient responsible charge related to this service. The patient expressed understanding and agreed to proceed.    I connected with Dale Mcintosh on 07/03/20  at  10:50 AM EST  EDT by telephone and verified that I am speaking with the correct person using two identifiers.   Consent I discussed the limitations, risks, security and privacy concerns of performing an evaluation and management service by telephone and the availability of in person appointments. I also discussed with the patient that there may be a patient responsible charge related to this service. The patient expressed understanding and agreed to proceed.   Location of Patient: Private  Residence   Location of Provider: [place] and [employer] Office    Persons participating in Telemedicine visit: Dale Rankins FNP-BC [place]    History of Present Illness: Telemedicine visit for: Hypertension Past medical history is significant for acute systolic heart failure, poorly controlled hypertension, morbid obesity  He has symptoms of OSA which include snoring, excessive daytime sleepiness and daytime fatigue.  Risk factors include male gender, hypertension and morbid obesity.  He was diagnosed with sleep apnea 3 to 4 years ago but does not have his own personal CPAP.  Will need a repeat study at the sleep center.  He has been using his father's CPAP machine.  He has a history of depression as his mother passed away in Nov 27, 2019 unexpectedly.     Essential Hypertension He endorses adherence  taking amlodipine 10 mg daily, carvedilol 25 mg twice daily, hydrochlorothiazide 25 mg daily and losartan 100 mg daily. Denies chest pain, shortness of breath, palpitations, lightheadedness, dizziness, headaches or BLE edema.  He does endorse nausea if he does not take his blood pressure medications with sodium. He does not have a blood pressure monitoring device at home.  BP Readings from Last 3 Encounters:  06/26/20 (!) 198/132  06/01/20 (!) 182/118  02/01/20 115/67      Past Medical History:  Diagnosis Date  . Hypertension   . OSA (obstructive sleep apnea)     Past Surgical History:  Procedure Laterality Date  . NO PAST SURGERIES    . None      Family History  Problem Relation Age of Onset  . Hypertension Mother   . Hypertension Father   . Diabetes Father     Social History   Socioeconomic History  . Marital status: Significant Other    Spouse name: Not on file  . Number of children: Not on file  . Years of education: Not on file  . Highest education level: Not on file  Occupational History  . Not on file  Tobacco Use  . Smoking status: Current Every Day Smoker    Packs/day: 0.50    Types: Cigarettes  . Smokeless tobacco: Current User  Vaping Use  . Vaping Use: Never used  Substance and Sexual Activity  . Alcohol use: Yes    Comment: occ  . Drug use: Yes    Types: Marijuana    Comment: daily  . Sexual activity: Yes  Other Topics Concern  . Not on  file  Social History Narrative  . Not on file   Social Determinants of Health   Financial Resource Strain: Not on file  Food Insecurity: Not on file  Transportation Needs: Not on file  Physical Activity: Not on file  Stress: Not on file  Social Connections: Not on file     Observations/Objective: Awake, alert and oriented x 3   Review of Systems  Constitutional: Negative for fever, malaise/fatigue and weight loss.  HENT: Negative.  Negative for nosebleeds.   Eyes: Negative.  Negative for blurred  vision, double vision and photophobia.  Respiratory: Negative.  Negative for cough and shortness of breath.   Cardiovascular: Negative.  Negative for chest pain, palpitations and leg swelling.  Gastrointestinal: Negative.  Negative for heartburn, nausea and vomiting.  Musculoskeletal: Negative.  Negative for myalgias.  Neurological: Negative.  Negative for dizziness, focal weakness, seizures and headaches.  Psychiatric/Behavioral: Negative.  Negative for suicidal ideas.    Assessment and Plan: Dale Mcintosh was seen today for hypertension.  Diagnoses and all orders for this visit:  Essential hypertension -     CMP14+EGFR; Future Continue all antihypertensives as prescribed.  Remember to bring in your blood pressure log with you for your follow up appointment.  DASH/Mediterranean Diets are healthier choices for HTN.     Morbid obesity with BMI of 40.0-44.9, adult ([place]) -     Lipid panel; Future Discussed diet and exercise for person with BMI >. Instructed: You must burn more calories than you eat. Losing 5 percent of your body weight should be considered a success. In the longer term, losing more than 15 percent of your body weight and staying at this weight is an extremely good result. However, keep in mind that even losing 5 percent of your body weight leads to important health benefits, so try not to get discouraged if you're not able to lose more than this. Will recheck weight in 3-6 months.   Elevated glucose -     Hemoglobin A1c; Future   Leukocytosis, unspecified type -     CBC with Differential; Future      Follow Up Instructions Return in about 3 weeks (around 07/24/2020) for BP recheck.     I discussed the assessment and treatment plan with the patient. The patient was provided an opportunity to ask questions and all were answered. The patient agreed with the plan and demonstrated an understanding of the instructions.   The patient was advised to call back or seek an  in-person evaluation if the symptoms worsen or if the condition fails to improve as anticipated.  I provided 12 minutes of non-face-to-face time during this encounter including median intraservice time, reviewing previous notes, labs, imaging, medications and explaining diagnosis and management.  Gildardo Pounds, FNP-BC

## 2020-07-04 LAB — CBC WITH DIFFERENTIAL/PLATELET
Basophils Absolute: 0.1 10*3/uL (ref 0.0–0.2)
Basos: 1 %
EOS (ABSOLUTE): 0.2 10*3/uL (ref 0.0–0.4)
Eos: 2 %
Hematocrit: 45.7 % (ref 37.5–51.0)
Hemoglobin: 14.4 g/dL (ref 13.0–17.7)
Immature Grans (Abs): 0 10*3/uL (ref 0.0–0.1)
Immature Granulocytes: 0 %
Lymphocytes Absolute: 1.7 10*3/uL (ref 0.7–3.1)
Lymphs: 21 %
MCH: 27.7 pg (ref 26.6–33.0)
MCHC: 31.5 g/dL (ref 31.5–35.7)
MCV: 88 fL (ref 79–97)
Monocytes Absolute: 0.7 10*3/uL (ref 0.1–0.9)
Monocytes: 9 %
Neutrophils Absolute: 5.2 10*3/uL (ref 1.4–7.0)
Neutrophils: 67 %
Platelets: 170 10*3/uL (ref 150–450)
RBC: 5.2 x10E6/uL (ref 4.14–5.80)
RDW: 13.6 % (ref 11.6–15.4)
WBC: 7.9 10*3/uL (ref 3.4–10.8)

## 2020-07-04 LAB — LIPID PANEL
Chol/HDL Ratio: 3.5 ratio (ref 0.0–5.0)
Cholesterol, Total: 165 mg/dL (ref 100–199)
HDL: 47 mg/dL (ref >39)
LDL Chol Calc (NIH): 101 mg/dL — ABNORMAL HIGH (ref 0–99)
Triglycerides: 92 mg/dL (ref 0–149)
VLDL Cholesterol Cal: 17 mg/dL (ref 5–40)

## 2020-07-04 LAB — CMP14+EGFR
ALT: 21 IU/L (ref 0–44)
AST: 24 IU/L (ref 0–40)
Albumin/Globulin Ratio: 1.6 (ref 1.2–2.2)
Albumin: 4.5 g/dL (ref 4.0–5.0)
Alkaline Phosphatase: 66 IU/L (ref 44–121)
BUN/Creatinine Ratio: 14 (ref 9–20)
BUN: 18 mg/dL (ref 6–20)
Bilirubin Total: 0.5 mg/dL (ref 0.0–1.2)
CO2: 24 mmol/L (ref 20–29)
Calcium: 9.5 mg/dL (ref 8.7–10.2)
Chloride: 102 mmol/L (ref 96–106)
Creatinine, Ser: 1.29 mg/dL — ABNORMAL HIGH (ref 0.76–1.27)
Globulin, Total: 2.8 g/dL (ref 1.5–4.5)
Glucose: 85 mg/dL (ref 65–99)
Potassium: 4.5 mmol/L (ref 3.5–5.2)
Sodium: 141 mmol/L (ref 134–144)
Total Protein: 7.3 g/dL (ref 6.0–8.5)
eGFR: 75 mL/min/{1.73_m2} (ref >59)

## 2020-07-04 LAB — HEMOGLOBIN A1C
Est. average glucose Bld gHb Est-mCnc: 111 mg/dL
Hgb A1c MFr Bld: 5.5 % (ref 4.8–5.6)

## 2020-07-21 ENCOUNTER — Ambulatory Visit: Payer: Self-pay | Attending: Nurse Practitioner | Admitting: Nurse Practitioner

## 2020-07-21 ENCOUNTER — Encounter: Payer: Self-pay | Admitting: Nurse Practitioner

## 2020-07-21 ENCOUNTER — Other Ambulatory Visit: Payer: Self-pay | Admitting: Nurse Practitioner

## 2020-07-21 ENCOUNTER — Other Ambulatory Visit: Payer: Self-pay

## 2020-07-21 VITALS — BP 156/110 | HR 77 | Resp 16 | Wt 301.8 lb

## 2020-07-21 DIAGNOSIS — I10 Essential (primary) hypertension: Secondary | ICD-10-CM

## 2020-07-21 MED ORDER — LOSARTAN POTASSIUM-HCTZ 100-25 MG PO TABS: 1.0000 | ORAL_TABLET | Freq: Every day | ORAL | 3 refills | Status: DC

## 2020-07-21 MED ORDER — CLONIDINE 0.1 MG/24HR TD PTWK: 0.1000 mg | MEDICATED_PATCH | TRANSDERMAL | 12 refills | Status: DC

## 2020-07-21 NOTE — Progress Notes (Signed)
Assessment & Plan:  Dale Mcintosh was seen today for hypertension.  Diagnoses and all orders for this visit:  Essential hypertension -     losartan-hydrochlorothiazide (HYZAAR) 100-25 MG tablet; Take 1 tablet by mouth daily. -     cloNIDine (CATAPRES - DOSED IN MG/24 HR) 0.1 mg/24hr patch; Place 1 patch (0.1 mg total) onto the skin once a week.    Patient has been counseled on age-appropriate routine health concerns for screening and prevention. These are reviewed and up-to-date. Referrals have been placed accordingly. Immunizations are up-to-date or declined.    Subjective:   Chief Complaint  Patient presents with  . Hypertension   HPI Dale Mcintosh 35 y.o. male presents to office today for follow up. He has a past medical history of Hypertension   Needs sleep study as he presents with possible obstructive sleep apnea. He generally gets several hours of sleep per night, and endorses nightime awakenings. Snoring of moderate severity is present. Apneic episodes are present. Nasal obstruction is not present.  Patient has not had tonsillectomy. Risk factors include morbid obesity, HTN, male gender. Patient has been advised to apply for financial assistance and schedule to see our financial counselor.   Essential Hypertension Blood pressure at home averaging: 160-200/90-100s.  Will losartan and hydrochlorothiazide to Hyzaar.  He will continue on amlodipine 10 mg daily, carvedilol 25 mg twice daily and will also add clonidine patch 0.1 mg weekly at this time. He smokes 1/2 ppd of cigarettes per day. Will address smoking cessation medications at next office visit. Denies chest pain, shortness of breath, palpitations, lightheadedness, dizziness, headaches or BLE edema.  BP Readings from Last 3 Encounters:  07/21/20 (!) 156/110  06/26/20 (!) 198/132  06/01/20 (!) 182/118   Depression screen PHQ 2/9 07/21/2020 07/03/2020 01/04/2020  Decreased Interest 0 0 2  Down, Depressed, Hopeless 0 0 1  PHQ  - 2 Score 0 0 3  Altered sleeping 3 0 0  Tired, decreased energy 3 0 3  Change in appetite 0 0 0  Feeling bad or failure about yourself  0 0 0  Trouble concentrating 0 0 0  Moving slowly or fidgety/restless 0 0 0  Suicidal thoughts 0 0 0  PHQ-9 Score 6 0 6   GAD 7 : Generalized Anxiety Score 07/21/2020 07/03/2020 01/04/2020  Nervous, Anxious, on Edge 0 0 0  Control/stop worrying 0 0 3  Worry too much - different things 2 0 3  Trouble relaxing 2 0 1  Restless 0 0 0  Easily annoyed or irritable 0 0 2  Afraid - awful might happen 0 0 0  Total GAD 7 Score 4 0 9    Review of Systems  Constitutional: Negative for fever, malaise/fatigue and weight loss.  HENT: Negative.  Negative for nosebleeds.   Eyes: Negative.  Negative for blurred vision, double vision and photophobia.  Respiratory: Negative.  Negative for cough and shortness of breath.   Cardiovascular: Negative.  Negative for chest pain, palpitations and leg swelling.  Gastrointestinal: Negative.  Negative for heartburn, nausea and vomiting.  Musculoskeletal: Negative.  Negative for myalgias.  Neurological: Negative.  Negative for dizziness, focal weakness, seizures and headaches.  Psychiatric/Behavioral: Negative.  Negative for suicidal ideas.    Past Medical History:  Diagnosis Date  . Hypertension   . OSA (obstructive sleep apnea)     Past Surgical History:  Procedure Laterality Date  . NO PAST SURGERIES    . None      Family History  Problem Relation Age of Onset  . Hypertension Mother   . Hypertension Father   . Diabetes Father     Social History Reviewed with no changes to be made today.   Outpatient Medications Prior to Visit  Medication Sig Dispense Refill  . amLODipine (NORVASC) 10 MG tablet Take 1 tablet (10 mg total) by mouth daily. 90 tablet 0  . carvedilol (COREG) 25 MG tablet Take 1 tablet (25 mg total) by mouth 2 (two) times daily with a meal. 60 tablet 2  . hydrochlorothiazide (HYDRODIURIL) 25 MG  tablet Take 1 tablet (25 mg total) by mouth daily. 30 tablet 0  . losartan (COZAAR) 100 MG tablet Take 1 tablet (100 mg total) by mouth daily. 90 tablet 0   Facility-Administered Medications Prior to Visit  Medication Dose Route Frequency Provider Last Rate Last Admin  . cloNIDine (CATAPRES) tablet 0.2 mg  0.2 mg Oral Once Rema Fendt, NP        Allergies  Allergen Reactions  . Bee Venom Anaphylaxis  . Shrimp [Shellfish Allergy] Itching       Objective:    BP (!) 156/110   Pulse 77   Resp 16   Wt (!) 301 lb 12.8 oz (136.9 kg)   SpO2 99%   BMI 42.69 kg/m  Wt Readings from Last 3 Encounters:  07/21/20 (!) 301 lb 12.8 oz (136.9 kg)  06/26/20 299 lb 9.6 oz (135.9 kg)  06/01/20 298 lb (135.2 kg)    Physical Exam Vitals and nursing note reviewed.  Constitutional:      Appearance: He is well-developed.  HENT:     Head: Normocephalic and atraumatic.  Cardiovascular:     Rate and Rhythm: Normal rate and regular rhythm.     Heart sounds: Normal heart sounds. No murmur heard. No friction rub. No gallop.   Pulmonary:     Effort: Pulmonary effort is normal. No tachypnea or respiratory distress.     Breath sounds: Normal breath sounds. No decreased breath sounds, wheezing, rhonchi or rales.  Chest:     Chest wall: No tenderness.  Abdominal:     General: Bowel sounds are normal.     Palpations: Abdomen is soft.  Musculoskeletal:        General: Normal range of motion.     Cervical back: Normal range of motion.  Skin:    General: Skin is warm and dry.  Neurological:     Mental Status: He is alert and oriented to person, place, and time.     Coordination: Coordination normal.  Psychiatric:        Behavior: Behavior normal. Behavior is cooperative.        Thought Content: Thought content normal.        Judgment: Judgment normal.          Patient has been counseled extensively about nutrition and exercise as well as the importance of adherence with medications and  regular follow-up. The patient was given clear instructions to go to ER or return to medical center if symptoms don't improve, worsen or new problems develop. The patient verbalized understanding.   Follow-up: Return for 2 weeks with luke. See me in 3 months. Claiborne Rigg, FNP-BC [facility] and [facility] [place], [place] [phone]   07/21/2020, 11:47 AM

## 2020-07-28 MED FILL — CLONIDINE 0.1 MG/24HR PTWK: 0.1 | 28 days supply | Qty: 4 | Fill #0

## 2020-07-28 MED FILL — LOSARTAN-HCTZ 100-25 MG TAB: 100-25 | 30 days supply | Qty: 30 | Fill #0

## 2020-08-03 ENCOUNTER — Other Ambulatory Visit: Payer: Self-pay

## 2020-08-03 ENCOUNTER — Ambulatory Visit: Payer: Self-pay | Attending: Nurse Practitioner | Admitting: Pharmacist

## 2020-08-03 ENCOUNTER — Encounter: Payer: Self-pay | Admitting: Pharmacist

## 2020-08-03 VITALS — BP 143/92

## 2020-08-03 DIAGNOSIS — I10 Essential (primary) hypertension: Secondary | ICD-10-CM

## 2020-08-03 MED FILL — CARVEDILOL 25 MG TABLET: 25 | 30 days supply | Qty: 60 | Fill #1

## 2020-08-03 MED FILL — AMLODIPINE BESYLATE 10 MG T: 10 | 30 days supply | Qty: 30 | Fill #1

## 2020-08-03 NOTE — Progress Notes (Signed)
   S:    PCP: Zelda   Patient arrives in good spirits. Presents to the clinic for hypertension evaluation, counseling, and management. Patient was referred and last seen by Primary Care Provider on 07/21/20. At that visit, BP was 156/110 so losartan and HCTZ was switched to Hyzaar and a clonidine patch 0.1 mg/day was started.  Today, patient comes in for BP check. Medication adherence reported with no missed doses since his appointment with PCP on 03/18. Endorses some dizziness when standing up but says it goes away within 60 seconds. Also endorses lightheadedness and blurry vision when he "coughs hard."   Current BP Medications include: amlodipine 10 mg daily, Hyzaar 100/25 mg daily, carvedilol 25 mg BID, clonidine patch 0.1 mg/24hr weekly  Dietary habits include: reports trying to avoid salt as much as he can, usually doesn't eat until dinner but says he averages 2 meals a day. Exercise habits include: works out for 30 minutes a day  Family History: hypertension (mother and father) Tobacco: 1/2 PPD Alcohol: occasionally  O:  Home BP readings: reports a reading of 139/81 a couple days ago  Today's Vitals   08/03/20 1023  BP: (!) 143/92   Last 3 Office BP readings: BP Readings from Last 3 Encounters:  08/03/20 (!) 143/92  07/21/20 (!) 156/110  06/26/20 (!) 198/132    BMET    Component Value Date/Time   NA 141 07/03/2020 1123   K 4.5 07/03/2020 1123   CL 102 07/03/2020 1123   CO2 24 07/03/2020 1123   GLUCOSE 85 07/03/2020 1123   GLUCOSE 136 (H) 02/01/2020 1622   BUN 18 07/03/2020 1123   CREATININE 1.29 (H) 07/03/2020 1123   CALCIUM 9.5 07/03/2020 1123   GFRNONAA >60 02/01/2020 1622   GFRAA >60 02/01/2020 1622    Renal function: CrCl cannot be calculated (Patient's most recent lab result is older than the maximum 21 days allowed.).  Clinical ASCVD: No  The ASCVD Risk score Denman George DC Jr., et al., 2013) failed to calculate for the following reasons:   The 2013 ASCVD  risk score is only valid for ages 85 to 66  A/P: Hypertension longstanding currently uncontrolled on current medications. BP Goal = < 130/80 mmHg. Medication adherence was reported. Patient has been improving since last clinic visit and since starting the clonidine patch. Counseled patient to keep taking medications appropriately and adhering to his current diet and exercise regimen. No changes made today because of slight improvement and want to see effects after full month of clonidine patch therapy. Informed patient that dizziness when standing up is most likely from the clonidine patch (orthostatic hypotension) but encouraged him that it should resolve in the next few months. -Continue amlodipine 10 mg daily -Continue Hyzaar 100/25 mg daily -Continue carvedilol 25 mg BID -Continue clonidine 0.1mg /24hr weekly -Counseled on lifestyle modifications for blood pressure control including reduced dietary sodium, increased exercise, adequate sleep.  Results reviewed and written information provided.   Total time in face-to-face counseling 15 minutes.   F/U Clinic Visit in 2-3 weeks.    Patient seen with: Paulino Door, PharmD Candidate [employer]  Butch Penny, PharmD, [place], CPP Clinical Pharmacist [facility] [phone]

## 2020-08-05 ENCOUNTER — Other Ambulatory Visit: Payer: Self-pay

## 2020-08-17 ENCOUNTER — Other Ambulatory Visit: Payer: Self-pay

## 2020-08-17 ENCOUNTER — Ambulatory Visit: Payer: Self-pay | Attending: Nurse Practitioner | Admitting: Pharmacist

## 2020-08-17 ENCOUNTER — Encounter: Payer: Self-pay | Admitting: Pharmacist

## 2020-08-17 VITALS — BP 141/88

## 2020-08-17 DIAGNOSIS — I10 Essential (primary) hypertension: Secondary | ICD-10-CM

## 2020-08-17 MED ORDER — CLONIDINE 0.2 MG/24HR TD PTWK
0.2000 mg | MEDICATED_PATCH | TRANSDERMAL | 2 refills | Status: DC
  Filled 2020-08-17: qty 4, 28d supply, fill #0

## 2020-08-17 NOTE — Progress Notes (Signed)
   S:    PCP: Zelda   Patient arrives in good spirits. Presents to the clinic for hypertension evaluation, counseling, and management. Patient was referred and last seen by Primary Care Provider on 07/21/20. At that visit, BP was 156/110 so losartan and HCTZ was switched to Hyzaar and a clonidine patch 0.1 mg/day was started. We saw him on 3/31 and made no changes given BP improvement.   Today, patient comes in for BP check. Medication adherence reported with no missed doses. Endorses some dizziness when standing up but says it goes away within 60 seconds. Endorses itching that's started after clonidine patch initiation. Wears the patch in the same place.   Current BP Medications include: amlodipine 10 mg daily, Hyzaar 100/25 mg daily, carvedilol 25 mg BID, clonidine patch 0.1 mg/24hr weekly  Dietary habits include: reports trying to avoid salt as much as he can, usually doesn't eat until dinner but says he averages 2 meals a day. Exercise habits include: works out for 30 minutes a day  Family History: hypertension (mother and father) Tobacco: 1/2 PPD Alcohol: occasionally  O:  Home BP readings: reports a reading of 139/81 a couple days ago  Today's Vitals   08/17/20 1027  BP: (!) 141/88   Last 3 Office BP readings: BP Readings from Last 3 Encounters:  08/17/20 (!) 141/88  08/03/20 (!) 143/92  07/21/20 (!) 156/110    BMET    Component Value Date/Time   NA 141 07/03/2020 1123   K 4.5 07/03/2020 1123   CL 102 07/03/2020 1123   CO2 24 07/03/2020 1123   GLUCOSE 85 07/03/2020 1123   GLUCOSE 136 (H) 02/01/2020 1622   BUN 18 07/03/2020 1123   CREATININE 1.29 (H) 07/03/2020 1123   CALCIUM 9.5 07/03/2020 1123   GFRNONAA >60 02/01/2020 1622   GFRAA >60 02/01/2020 1622    Renal function: CrCl cannot be calculated (Patient's most recent lab result is older than the maximum 21 days allowed.).  Clinical ASCVD: No  The ASCVD Risk score Denman George DC Jr., et al., 2013) failed to  calculate for the following reasons:   The 2013 ASCVD risk score is only valid for ages 69 to 42  A/P: Hypertension longstanding currently uncontrolled on current medications. BP Goal = < 130/80 mmHg. Medication adherence reported. Patient has been improving since last clinic visit. Counseled patient to rotate sites in regards to clonidine patch application. Will increase clonidine to 0.2 mg/24hr weekly patch.  -Continue amlodipine 10 mg daily -Continue Hyzaar 100/25 mg daily -Continue carvedilol 25 mg BID -Increase clonidine to 0.2mg /24hr weekly -Counseled on lifestyle modifications for blood pressure control including reduced dietary sodium, increased exercise, adequate sleep.  Results reviewed and written information provided.   Total time in face-to-face counseling 15 minutes.   F/U Clinic Visit in 2-3 weeks.    Patient seen with: Paulino Door, PharmD Candidate [employer]  Butch Penny, PharmD, [place], CPP Clinical Pharmacist [facility] [phone]

## 2020-08-31 ENCOUNTER — Ambulatory Visit: Payer: Self-pay | Attending: Nurse Practitioner | Admitting: Pharmacist

## 2020-08-31 ENCOUNTER — Encounter: Payer: Self-pay | Admitting: Nurse Practitioner

## 2020-08-31 ENCOUNTER — Other Ambulatory Visit: Payer: Self-pay

## 2020-08-31 ENCOUNTER — Other Ambulatory Visit: Payer: Self-pay | Admitting: Nurse Practitioner

## 2020-08-31 VITALS — BP 117/77 | HR 73

## 2020-08-31 DIAGNOSIS — I10 Essential (primary) hypertension: Secondary | ICD-10-CM

## 2020-08-31 MED ORDER — SILDENAFIL CITRATE 100 MG PO TABS
50.0000 mg | ORAL_TABLET | Freq: Every day | ORAL | 6 refills | Status: DC | PRN
  Filled 2020-08-31: qty 10, 30d supply, fill #0

## 2020-08-31 NOTE — Progress Notes (Signed)
   S:    PCP: Zelda   Patient arrives in good spirits. Presents to the clinic for hypertension evaluation, counseling, and management. Patient was referred and last seen by Primary Care Provider on 07/21/20. At that visit, BP was 156/110 so losartan and HCTZ was switched to Hyzaar and a clonidine patch 0.1 mg/day was started. We saw him on 08/17/20 and increased the clonidine to 0.2 mg/daily.   Today, patient comes in for BP check. Medication adherence reported with no missed doses. Itching has improved since he was counseled to change sites for the clonidine patch. Also reports that dizziness has improved. He is endorsing trouble getting and maintaining an erection. Reports that the problem started shortly after starting the clonidine patch.   Current BP Medications include: amlodipine 10 mg daily, Hyzaar 100/25 mg daily, carvedilol 25 mg BID, clonidine patch 0.1 mg/24hr weekly  Dietary habits include: reports trying to avoid salt as much as he can, usually doesn't eat until dinner but says he averages 2 meals a day. Exercise habits include: works out for 30 minutes a day  Family History: hypertension (mother and father) Tobacco: 1/2 PPD Alcohol: occasionally  O:  Home BP readings: reports a reading of 139/81 a couple days ago  Today's Vitals   08/31/20 1017  BP: 117/77  Pulse: 73   Last 3 Office BP readings: BP Readings from Last 3 Encounters:  08/31/20 117/77  08/17/20 (!) 141/88  08/03/20 (!) 143/92   BMET    Component Value Date/Time   NA 141 07/03/2020 1123   K 4.5 07/03/2020 1123   CL 102 07/03/2020 1123   CO2 24 07/03/2020 1123   GLUCOSE 85 07/03/2020 1123   GLUCOSE 136 (H) 02/01/2020 1622   BUN 18 07/03/2020 1123   CREATININE 1.29 (H) 07/03/2020 1123   CALCIUM 9.5 07/03/2020 1123   GFRNONAA >60 02/01/2020 1622   GFRAA >60 02/01/2020 1622    Renal function: CrCl cannot be calculated (Patient's most recent lab result is older than the maximum 21 days  allowed.).  Clinical ASCVD: No  The ASCVD Risk score Denman George DC Jr., et al., 2013) failed to calculate for the following reasons:   The 2013 ASCVD risk score is only valid for ages 54 to 62  A/P: Hypertension longstanding currently controlled on current medications. BP Goal = < 130/80 mmHg. Medication adherence reported.  -Continue amlodipine 10 mg daily -Continue Hyzaar 100/25 mg daily -Continue carvedilol 25 mg BID -Continue clonidine 0.2mg /day patch  -Counseled on lifestyle modifications for blood pressure control including reduced dietary sodium, increased exercise, adequate sleep.  Results reviewed and written information provided.   Total time in face-to-face counseling 15 minutes.   F/U Clinic Visit w/ PCP.     Butch Penny, PharmD, [place], CPP Clinical Pharmacist [facility] [phone]

## 2020-09-01 ENCOUNTER — Other Ambulatory Visit: Payer: Self-pay

## 2020-09-08 ENCOUNTER — Other Ambulatory Visit: Payer: Self-pay

## 2020-09-08 MED FILL — Losartan Potassium & Hydrochlorothiazide Tab 100-25 MG: ORAL | 30 days supply | Qty: 30 | Fill #0 | Status: AC

## 2020-09-08 MED FILL — Amlodipine Besylate Tab 10 MG (Base Equivalent): ORAL | 30 days supply | Qty: 30 | Fill #0 | Status: AC

## 2020-09-08 MED FILL — Carvedilol Tab 25 MG: ORAL | 30 days supply | Qty: 60 | Fill #0 | Status: AC

## 2020-10-04 ENCOUNTER — Emergency Department (HOSPITAL_COMMUNITY)
Admission: EM | Admit: 2020-10-04 | Discharge: 2020-10-05 | Disposition: A | Discharge status: Home / Self Care | Payer: Self-pay | Attending: Emergency Medicine | Admitting: Emergency Medicine

## 2020-10-04 ENCOUNTER — Encounter (HOSPITAL_COMMUNITY): Payer: Self-pay

## 2020-10-04 ENCOUNTER — Other Ambulatory Visit: Payer: Self-pay

## 2020-10-04 DIAGNOSIS — I110 Hypertensive heart disease with heart failure: Secondary | ICD-10-CM | POA: Insufficient documentation

## 2020-10-04 DIAGNOSIS — Z79899 Other long term (current) drug therapy: Secondary | ICD-10-CM | POA: Insufficient documentation

## 2020-10-04 DIAGNOSIS — R55 Syncope and collapse: Secondary | ICD-10-CM | POA: Insufficient documentation

## 2020-10-04 DIAGNOSIS — I5021 Acute systolic (congestive) heart failure: Secondary | ICD-10-CM | POA: Insufficient documentation

## 2020-10-04 DIAGNOSIS — T675XXA Heat exhaustion, unspecified, initial encounter: Secondary | ICD-10-CM | POA: Insufficient documentation

## 2020-10-04 DIAGNOSIS — F17210 Nicotine dependence, cigarettes, uncomplicated: Secondary | ICD-10-CM | POA: Insufficient documentation

## 2020-10-04 DIAGNOSIS — T679XXA Effect of heat and light, unspecified, initial encounter: Secondary | ICD-10-CM

## 2020-10-04 NOTE — ED Triage Notes (Signed)
Feels over heated while at work - had nausea and vomiting x 2 episodes. Denies any abdominal pain, dysuria and diarrhea. [id number]

## 2020-10-05 LAB — CBC WITH DIFFERENTIAL/PLATELET
Abs Immature Granulocytes: 0.04 10*3/uL (ref 0.00–0.07)
Basophils Absolute: 0.1 10*3/uL (ref 0.0–0.1)
Basophils Relative: 1 %
Eosinophils Absolute: 0.3 10*3/uL (ref 0.0–0.5)
Eosinophils Relative: 3 %
HCT: 43.5 % (ref 39.0–52.0)
Hemoglobin: 14 g/dL (ref 13.0–17.0)
Immature Granulocytes: 0 %
Lymphocytes Relative: 29 %
Lymphs Abs: 2.7 10*3/uL (ref 0.7–4.0)
MCH: 28.9 pg (ref 26.0–34.0)
MCHC: 32.2 g/dL (ref 30.0–36.0)
MCV: 89.9 fL (ref 80.0–100.0)
Monocytes Absolute: 1 10*3/uL (ref 0.1–1.0)
Monocytes Relative: 11 %
Neutro Abs: 5 10*3/uL (ref 1.7–7.7)
Neutrophils Relative %: 56 %
Platelets: 203 10*3/uL (ref 150–400)
RBC: 4.84 MIL/uL (ref 4.22–5.81)
RDW: 15 % (ref 11.5–15.5)
WBC: 9.1 10*3/uL (ref 4.0–10.5)
nRBC: 0 % (ref 0.0–0.2)

## 2020-10-05 LAB — COMPREHENSIVE METABOLIC PANEL
ALT: 24 U/L (ref 0–44)
AST: 27 U/L (ref 15–41)
Albumin: 3.9 g/dL (ref 3.5–5.0)
Alkaline Phosphatase: 62 U/L (ref 38–126)
Anion gap: 10 (ref 5–15)
BUN: 19 mg/dL (ref 6–20)
CO2: 25 mmol/L (ref 22–32)
Calcium: 9 mg/dL (ref 8.9–10.3)
Chloride: 102 mmol/L (ref 98–111)
Creatinine, Ser: 1.43 mg/dL — ABNORMAL HIGH (ref 0.61–1.24)
GFR, Estimated: >60 mL/min (ref >60)
Glucose, Bld: 104 mg/dL — ABNORMAL HIGH (ref 70–99)
Potassium: 3.3 mmol/L — ABNORMAL LOW (ref 3.5–5.1)
Sodium: 137 mmol/L (ref 135–145)
Total Bilirubin: 0.6 mg/dL (ref 0.3–1.2)
Total Protein: 7.6 g/dL (ref 6.5–8.1)

## 2020-10-05 LAB — CK: Total CK: 415 U/L — ABNORMAL HIGH (ref 49–397)

## 2020-10-05 MED ORDER — SODIUM CHLORIDE 0.9 % IV BOLUS
1000.0000 mL | Freq: Once | INTRAVENOUS | Status: AC
  Administered 2020-10-05: 1000 mL via INTRAVENOUS

## 2020-10-05 NOTE — Discharge Instructions (Signed)
Your labs today were reassuring.  You did not allow an EKG so cannot say for sure there is no cardiac abnormalities. I recommend that you rest and hydrate today, try to stay out of the heat. Follow-up with your primary care doctor. Return here for new concerns.

## 2020-10-05 NOTE — ED Provider Notes (Signed)
[facility] EMERGENCY DEPARTMENT Provider Note   CSN: [id number] Arrival date & time: 10/04/20  2007     History Chief Complaint  Patient presents with  . Nausea  . Emesis  . Heat Exposure    Dale Mcintosh is a 35 y.o. male.  The history is provided by the patient and medical records.  Emesis   35 year old male with history of hypertension, sleep apnea, presenting to the ED after heat exposure and near syncopal event.  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  States while working he got very lightheaded like he was going to pass out, mainly sweaty, and overall "did not feel right".  He did have a few episodes of nausea/vomiting.  He reports history of heat exposure in the past with similar.  He never lost consciousness.  He has been waiting for a few hours now and states overall he feels improved but is concerned due to his high blood pressure.  He felt that may have been associated, he is currently on 3 different pills as well as a patch for blood pressure control.  He has been told by his PCP did it is "resistant".  He denies any chest pain, headache, confusion, numbness, or weakness.  He was able to drink soda in the lobby without recurrent emesis.  Past Medical History:  Diagnosis Date  . Hypertension   . OSA (obstructive sleep apnea)     Patient Active Problem List   Diagnosis Date Noted  . Educated about COVID-19 virus infection 09/23/2018  . Acute systolic HF (heart failure) (HCC) 09/23/2018  . Hypertensive urgency 09/23/2018  . Chest pain 08/28/2018  . Excessive daytime sleepiness 02/01/2017  . Morning headache 02/01/2017  . Morbid obesity (HCC) 02/01/2017  . Snoring 02/01/2017  . Hypertensive emergency 02/01/2017    Past Surgical History:  Procedure Laterality Date  . NO PAST SURGERIES    . None         Family History  Problem Relation Age of Onset  . Hypertension Mother    . Hypertension Father   . Diabetes Father     Social History   Tobacco Use  . Smoking status: Current Every Day Smoker    Packs/day: 0.50    Types: Cigarettes  . Smokeless tobacco: Current User  Vaping Use  . Vaping Use: Never used  Substance Use Topics  . Alcohol use: Yes    Comment: occ  . Drug use: Yes    Types: Marijuana    Comment: daily    Home Medications Prior to Admission medications   Medication Sig Start Date End Date Taking? Authorizing Provider  amLODipine (NORVASC) 10 MG tablet TAKE 1 TABLET (10 MG TOTAL) BY MOUTH DAILY. 06/27/20 06/27/21  Rema Fendt, NP  carvedilol (COREG) 25 MG tablet TAKE 1 TABLET (25 MG TOTAL) BY MOUTH 2 (TWO) TIMES DAILY WITH A MEAL. 06/27/20 06/27/21  Rema Fendt, NP  ciprofloxacin (CIPRO) 500 MG tablet TAKE 1 TABLET (500 MG TOTAL) BY MOUTH 2 (TWO) TIMES DAILY FOR 10 DAYS. 06/01/20 06/01/21  Georgetta Haber, NP  cloNIDine (CATAPRES - DOSED IN MG/24 HR) 0.2 mg/24hr patch Place 1 patch (0.2 mg total) onto the skin once a week. 08/17/20   Hoy Register, MD  losartan-hydrochlorothiazide (HYZAAR) 100-25 MG tablet TAKE 1 TABLET BY MOUTH DAILY. 07/21/20 07/21/21  Claiborne Rigg, NP  metroNIDAZOLE (FLAGYL) 500 MG tablet TAKE 1 TABLET (  500 MG TOTAL) BY MOUTH 3 (THREE) TIMES DAILY FOR 10 DAYS. 06/01/20 06/01/21  Georgetta Haber, NP  sildenafil (VIAGRA) 100 MG tablet Take 0.5-1 tablets (50-100 mg total) by mouth daily as needed for erectile dysfunction. 08/31/20   Claiborne Rigg, NP  hydrochlorothiazide (HYDRODIURIL) 25 MG tablet Take 1 tablet (25 mg total) by mouth daily. 06/27/20 07/21/20  Rema Fendt, NP  losartan (COZAAR) 100 MG tablet Take 1 tablet (100 mg total) by mouth daily. 06/27/20 07/21/20  Rema Fendt, NP  nitroGLYCERIN (NITROSTAT) 0.4 MG SL tablet Place 1 tablet (0.4 mg total) under the tongue every 5 (five) minutes as needed for chest pain (per CT heart protocol). Patient not taking: Reported on 11/02/2018 08/29/18 02/25/19  Glade Lloyd, MD    Allergies    Bee venom and Shrimp [shellfish allergy]  Review of Systems   Review of Systems  Constitutional:       Heat exposure  Gastrointestinal: Positive for nausea and vomiting.  All other systems reviewed and are negative.   Physical Exam Updated Vital Signs BP 137/79 (BP Location: Left Arm)   Pulse 72   Temp 98.4 F (36.9 C) (Oral)   Resp 20   Ht 5\' 10"  (1.778 m)   Wt 134.3 kg   SpO2 100%   BMI 42.47 kg/m   Physical Exam Vitals and nursing note reviewed.  Constitutional:      Appearance: He is well-developed.  HENT:     Head: Normocephalic and atraumatic.  Eyes:     Conjunctiva/sclera: Conjunctivae normal.     Pupils: Pupils are equal, round, and reactive to light.  Cardiovascular:     Rate and Rhythm: Normal rate and regular rhythm.     Heart sounds: Normal heart sounds.  Pulmonary:     Effort: Pulmonary effort is normal. No respiratory distress.     Breath sounds: Normal breath sounds. No rhonchi.  Abdominal:     General: Bowel sounds are normal.     Palpations: Abdomen is soft.     Tenderness: There is no abdominal tenderness. There is no rebound.  Musculoskeletal:        General: Normal range of motion.     Cervical back: Normal range of motion.  Skin:    General: Skin is warm and dry.  Neurological:     Mental Status: He is alert and oriented to person, place, and time.     Comments: AAOx3, answering questions and following commands appropriately; equal strength UE and LE bilaterally; CN grossly intact; moves all extremities appropriately without ataxia; no focal neuro deficits or facial asymmetry appreciated     ED Results / Procedures / Treatments   Labs (all labs ordered are listed, but only abnormal results are displayed) Labs Reviewed  CK - Abnormal; Notable for the following components:      Result Value   Total CK 415 (*)    All other components within normal limits  COMPREHENSIVE METABOLIC PANEL - Abnormal; Notable  for the following components:   Potassium 3.3 (*)    Glucose, Bld 104 (*)    Creatinine, Ser 1.43 (*)    All other components within normal limits  CBC WITH DIFFERENTIAL/PLATELET  URINALYSIS, ROUTINE W REFLEX MICROSCOPIC    EKG None  Radiology No results found.  Procedures Procedures   Medications Ordered in ED Medications  sodium chloride 0.9 % bolus 1,000 mL (has no administration in time range)    ED Course  I have reviewed  the triage vital signs and the nursing notes.  Pertinent labs & imaging results that were available during my care of the patient were reviewed by me and considered in my medical decision making (see chart for details).    MDM Rules/Calculators/A&P    35 year old male presenting to the ED after heat exposure, nausea, vomiting, and near syncopal event.  He reports history of same.  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  There was no full loss of consciousness.  Unfortunately, prolonged wait in the lobby and states he is actually feeling better at this time after sitting in the air conditioner.  He has been able to eat and drink in the waiting room without recurrent emesis.  His vitals are stable.  He is concerned due to his underlying history of hypertension, pressures have been well controlled here in the ED.  He denies any chest pain or headache.  Neurologic exam is non-focal.  Will obtain screening labs including CK, give IV fluids.  Will obtain EKG.  2:50 AM Notified by RN that patient refusing EKG, IVF, and any further work-up.  Labs have been drawn but have not resulted.  Apparently he is mad that he has been here for several hours and voiced he "just wants to go home".  Patient is aware evaluation is not complete and cannot definitively say no acute process ongoing.  He is of sound mind, capable of making his own medical decisions.  He has now decided to stay for fluids but still refusing EKG.   Labs  overall reassuring.  CK minimally elevated at 415.  SrCr appears around his baseline.  He was hydrated here, VS remained stable.  Continues to refuse EKG on multiple attempts.  Suspect this is related to heat exposure but he is aware potential cardiac etiology cannot be fully excluded.  He was instructed to rest, hydrate, and take it easy today.  Will need to follow-up with primary care doctor.  Return here for new concerns.  Final Clinical Impression(s) / ED Diagnoses Final diagnoses:  Heat exposure, initial encounter    Rx / DC Orders ED Discharge Orders    None       Garlon Hatchet, PA-C 10/05/20 0539    Marily Memos, MD 10/05/20 [phone]

## 2020-10-05 NOTE — ED Notes (Signed)
Patient refusing EKG and IVF at this time, PA Sanders notified, patient states he would stay for fluids but then wants to leave.

## 2020-10-05 NOTE — ED Notes (Signed)
Patient refused EKG.

## 2020-10-23 ENCOUNTER — Encounter: Payer: Self-pay | Admitting: Nurse Practitioner

## 2020-10-23 ENCOUNTER — Ambulatory Visit: Payer: Self-pay | Attending: Nurse Practitioner | Admitting: Nurse Practitioner

## 2020-10-23 ENCOUNTER — Other Ambulatory Visit: Payer: Self-pay

## 2020-10-23 DIAGNOSIS — G4733 Obstructive sleep apnea (adult) (pediatric): Secondary | ICD-10-CM

## 2020-10-23 DIAGNOSIS — I10 Essential (primary) hypertension: Secondary | ICD-10-CM

## 2020-10-23 MED ORDER — CLONIDINE 0.2 MG/24HR TD PTWK
0.2000 mg | MEDICATED_PATCH | TRANSDERMAL | 2 refills | Status: DC
  Filled 2020-10-23: qty 4, 28d supply, fill #0
  Filled 2021-03-19 – 2021-03-28 (×2): qty 4, 28d supply, fill #1

## 2020-10-23 MED ORDER — AMLODIPINE BESYLATE 10 MG PO TABS
ORAL_TABLET | Freq: Every day | ORAL | 0 refills | Status: DC
  Filled 2020-10-23 – 2020-11-03 (×2): qty 30, 30d supply, fill #0
  Filled 2021-01-17: qty 30, 30d supply, fill #1
  Filled 2021-03-19 – 2021-03-28 (×2): qty 30, 30d supply, fill #2

## 2020-10-23 MED ORDER — CARVEDILOL 25 MG PO TABS
ORAL_TABLET | Freq: Two times a day (BID) | ORAL | 2 refills | Status: DC
  Filled 2020-10-23 – 2020-11-03 (×2): qty 60, 30d supply, fill #0
  Filled 2021-01-17: qty 60, 30d supply, fill #1
  Filled 2021-03-19 – 2021-03-28 (×2): qty 60, 30d supply, fill #2

## 2020-10-23 MED ORDER — LOSARTAN POTASSIUM-HCTZ 100-25 MG PO TABS
1.0000 | ORAL_TABLET | Freq: Every day | ORAL | 3 refills | Status: DC
  Filled 2020-10-23 – 2020-11-03 (×2): qty 30, 30d supply, fill #0
  Filled 2021-01-17: qty 30, 30d supply, fill #1
  Filled 2021-03-19 – 2021-03-28 (×2): qty 30, 30d supply, fill #2

## 2020-10-23 NOTE — Progress Notes (Signed)
No concerns today. Medication refill

## 2020-10-23 NOTE — Progress Notes (Signed)
Virtual Visit via Telephone Note Due to national recommendations of social distancing due to COVID 19, telehealth visit is felt to be most appropriate for this patient at this time.  I discussed the limitations, risks, security and privacy concerns of performing an evaluation and management service by telephone and the availability of in person appointments. I also discussed with the patient that there may be a patient responsible charge related to this service. The patient expressed understanding and agreed to proceed.    I connected with Dale Mcintosh on 10/23/20  at  10:30 AM EDT  EDT by telephone and verified that I am speaking with the correct person using two identifiers.  Location of Patient: Private Residence   Location of Provider: [facility]    Persons participating in Telemedicine visit: Dale Mcintosh Dale Mcintosh    History of Present Illness: Telemedicine visit for: Follow up to HTN and medication refills.  He has a past medical history of Hypertension and OSA (obstructive sleep apnea).    OSA Can't sleep at night. Wakes up tired after only 3 hours of sleep. Excessive daytime sleepiness, fatigue. Risk factors: Morbid obesity, snoring, CHF, HTN, morning headaches. He does not wear a CPAP.  NEEDS SLEEP STUDY.  Patient has been advised to apply for financial assistance and schedule to see our financial counselor.    Essential Hypertension Well controlled. May need to switch hyzaar due to hypokalemia.  He has been on the irbesartan/hydrochlorothiazide 150-12.5 mg daily in the past which was prescribed by cardiology.  Currently taking should amlodipine 10 mg daily, carvedilol 25 mg twice daily, clonidine patch 0.2 mg weekly and Hyzaar 100-25 mg daily. Denies chest pain, shortness of breath, palpitations, lightheadedness, dizziness,  or BLE edema.   BP Readings from Last 3 Encounters:  10/05/20 123/71  08/31/20 117/77  08/17/20 (!)  141/88        Past Medical History:  Diagnosis Date   Hypertension    OSA (obstructive sleep apnea)     Past Surgical History:  Procedure Laterality Date   NO PAST SURGERIES     None      Family History  Problem Relation Age of Onset   Hypertension Mother    Hypertension Father    Diabetes Father     Social History   Socioeconomic History   Marital status: Significant Other    Spouse name: Not on file   Number of children: Not on file   Years of education: Not on file   Highest education level: Not on file  Occupational History   Not on file  Tobacco Use   Smoking status: Every Day    Packs/day: 0.50    Pack years: 0.00    Types: Cigarettes   Smokeless tobacco: Current  Vaping Use   Vaping Use: Never used  Substance and Sexual Activity   Alcohol use: Yes    Comment: occ   Drug use: Yes    Types: Marijuana    Comment: daily   Sexual activity: Yes  Other Topics Concern   Not on file  Social History Narrative   Not on file   Social Determinants of Health   Financial Resource Strain: Not on file  Food Insecurity: Not on file  Transportation Needs: Not on file  Physical Activity: Not on file  Stress: Not on file  Social Connections: Not on file     Observations/Objective: Awake, alert and oriented x 3   Review of Systems  Constitutional:  Positive for malaise/fatigue. Negative for fever and weight loss.  HENT: Negative.  Negative for nosebleeds.   Eyes: Negative.  Negative for blurred vision, double vision and photophobia.  Respiratory: Negative.  Negative for cough and shortness of breath.   Cardiovascular: Negative.  Negative for chest pain, palpitations and leg swelling.  Gastrointestinal: Negative.  Negative for heartburn, nausea and vomiting.  Musculoskeletal: Negative.  Negative for myalgias.  Neurological: Negative.  Negative for dizziness, focal weakness, seizures and headaches.  Psychiatric/Behavioral: Negative.  Negative for suicidal  ideas.    Assessment and Plan: Dale Mcintosh was seen today for hypertension.  Diagnoses and all orders for this visit:  Primary hypertension -     amLODipine (NORVASC) 10 MG tablet; TAKE 1 TABLET (10 MG TOTAL) BY MOUTH DAILY. -     carvedilol (COREG) 25 MG tablet; TAKE 1 TABLET (25 MG TOTAL) BY MOUTH 2 (TWO) TIMES DAILY WITH A MEAL. -     cloNIDine (CATAPRES - DOSED IN MG/24 HR) 0.2 mg/24hr patch; Place 1 patch (0.2 mg total) onto the skin once a week. -     losartan-hydrochlorothiazide (HYZAAR) 100-25 MG tablet; TAKE 1 TABLET BY MOUTH DAILY. -     Basic metabolic panel; Future  OSA (obstructive sleep apnea)  NEEDS SLEEP STUDY   Follow Up Instructions Return in about 3 months (around 01/23/2021).     I discussed the assessment and treatment plan with the patient. The patient was provided an opportunity to ask questions and all were answered. The patient agreed with the plan and demonstrated an understanding of the instructions.   The patient was advised to call back or seek an in-person evaluation if the symptoms worsen or if the condition fails to improve as anticipated.  I provided 14 minutes of non-face-to-face time during this encounter including median intraservice time, reviewing previous notes, labs, imaging, medications and explaining diagnosis and management.  Claiborne Rigg, Mcintosh

## 2020-10-24 ENCOUNTER — Other Ambulatory Visit: Payer: Self-pay

## 2020-10-30 ENCOUNTER — Other Ambulatory Visit: Payer: Self-pay

## 2020-10-31 ENCOUNTER — Other Ambulatory Visit: Payer: Self-pay

## 2020-11-03 ENCOUNTER — Other Ambulatory Visit: Payer: Self-pay

## 2020-11-25 ENCOUNTER — Ambulatory Visit (HOSPITAL_COMMUNITY)
Admission: EM | Admit: 2020-11-25 | Discharge: 2020-11-25 | Disposition: A | Discharge status: Home / Self Care | Payer: Self-pay | Attending: Emergency Medicine | Admitting: Emergency Medicine

## 2020-11-25 ENCOUNTER — Other Ambulatory Visit: Payer: Self-pay

## 2020-11-25 ENCOUNTER — Encounter (HOSPITAL_COMMUNITY): Payer: Self-pay

## 2020-11-25 DIAGNOSIS — K047 Periapical abscess without sinus: Secondary | ICD-10-CM

## 2020-11-25 DIAGNOSIS — K0889 Other specified disorders of teeth and supporting structures: Secondary | ICD-10-CM

## 2020-11-25 MED ORDER — LIDOCAINE VISCOUS HCL 2 % MT SOLN: 15.0000 mL | OROMUCOSAL | 0 refills | Status: DC | PRN

## 2020-11-25 MED ORDER — AMOXICILLIN-POT CLAVULANATE 875-125 MG PO TABS: 1.0000 | ORAL_TABLET | Freq: Two times a day (BID) | ORAL | 0 refills | Status: DC

## 2020-11-25 NOTE — ED Provider Notes (Signed)
[facility]    CSN: [id number] Arrival date & time: 11/25/20  1621      History   Chief Complaint Chief Complaint  Patient presents with   Dental Pain    HPI Dale Mcintosh is a 35 y.o. male.   Patient here for evaluation of left upper dental pain that started yesterday.  Report history of chipped tooth.  Reports being evaluated by dentist recently but has not been able to follow up to have tooth pulled due to cost.  Reports using motrin and orajel with minimal relief.  Denies any trauma, injury, or other precipitating event.  Denies any specific alleviating or aggravating factors.  Denies any fevers, chest pain, shortness of breath, N/V/D, numbness, tingling, weakness, abdominal pain, or headaches.     The history is provided by the patient.  Dental Pain  Past Medical History:  Diagnosis Date   Hypertension    OSA (obstructive sleep apnea)     Patient Active Problem List   Diagnosis Date Noted   Educated about COVID-19 virus infection 09/23/2018   Acute systolic HF (heart failure) (HCC) 09/23/2018   Hypertensive urgency 09/23/2018   Chest pain 08/28/2018   Excessive daytime sleepiness 02/01/2017   Morning headache 02/01/2017   Morbid obesity (HCC) 02/01/2017   Snoring 02/01/2017   Hypertensive emergency 02/01/2017    Past Surgical History:  Procedure Laterality Date   NO PAST SURGERIES     None         Home Medications    Prior to Admission medications   Medication Sig Start Date End Date Taking? Authorizing Provider  amoxicillin-clavulanate (AUGMENTIN) 875-125 MG tablet Take 1 tablet by mouth every 12 (twelve) hours. 11/25/20  Yes Ivette Loyal, NP  lidocaine (XYLOCAINE) 2 % solution Use as directed 15 mLs in the mouth or throat as needed for mouth pain. 11/25/20  Yes Ivette Loyal, NP  amLODipine (NORVASC) 10 MG tablet TAKE 1 TABLET (10 MG TOTAL) BY MOUTH DAILY. 10/23/20 10/23/21  Claiborne Rigg, NP  carvedilol (COREG) 25 MG tablet TAKE 1  TABLET (25 MG TOTAL) BY MOUTH 2 (TWO) TIMES DAILY WITH A MEAL. 10/23/20 10/23/21  Claiborne Rigg, NP  cloNIDine (CATAPRES - DOSED IN MG/24 HR) 0.2 mg/24hr patch Place 1 patch (0.2 mg total) onto the skin once a week. 10/23/20   Claiborne Rigg, NP  losartan-hydrochlorothiazide (HYZAAR) 100-25 MG tablet TAKE 1 TABLET BY MOUTH DAILY. 10/23/20 10/23/21  Claiborne Rigg, NP  sildenafil (VIAGRA) 100 MG tablet Take 0.5-1 tablets (50-100 mg total) by mouth daily as needed for erectile dysfunction. 08/31/20   Claiborne Rigg, NP  hydrochlorothiazide (HYDRODIURIL) 25 MG tablet Take 1 tablet (25 mg total) by mouth daily. 06/27/20 07/21/20  Rema Fendt, NP  losartan (COZAAR) 100 MG tablet Take 1 tablet (100 mg total) by mouth daily. 06/27/20 07/21/20  Rema Fendt, NP  nitroGLYCERIN (NITROSTAT) 0.4 MG SL tablet Place 1 tablet (0.4 mg total) under the tongue every 5 (five) minutes as needed for chest pain (per CT heart protocol). Patient not taking: Reported on 11/02/2018 08/29/18 02/25/19  Glade Lloyd, MD    Family History Family History  Problem Relation Age of Onset   Hypertension Mother    Hypertension Father    Diabetes Father     Social History Social History   Tobacco Use   Smoking status: Every Day    Packs/day: 0.50    Types: Cigarettes   Smokeless tobacco: Current  Vaping  Use   Vaping Use: Never used  Substance Use Topics   Alcohol use: Yes    Comment: occ   Drug use: Yes    Types: Marijuana    Comment: daily     Allergies   Bee venom and Shrimp [shellfish allergy]   Review of Systems Review of Systems  HENT:  Positive for dental problem.   All other systems reviewed and are negative.   Physical Exam Triage Vital Signs ED Triage Vitals  Enc Vitals Group     BP 11/25/20 1633 (!) 142/92     Pulse Rate 11/25/20 1633 68     Resp 11/25/20 1633 20     Temp 11/25/20 1633 98.6 F (37 C)     Temp Source 11/25/20 1633 Oral     SpO2 11/25/20 1633 98 %     Weight --       Height --      Head Circumference --      Peak Flow --      Pain Score 11/25/20 1631 10     Pain Loc --      Pain Edu? --      Excl. in GC? --    No data found.  Updated Vital Signs BP (!) 142/92 (BP Location: Left Arm)   Pulse 68   Temp 98.6 F (37 C) (Oral)   Resp 20   SpO2 98%   Visual Acuity Right Eye Distance:   Left Eye Distance:   Bilateral Distance:    Right Eye Near:   Left Eye Near:    Bilateral Near:     Physical Exam Vitals and nursing note reviewed.  Constitutional:      General: He is not in acute distress.    Appearance: Normal appearance. He is not ill-appearing, toxic-appearing or diaphoretic.  HENT:     Head: Normocephalic and atraumatic.     Mouth/Throat:     Dentition: Abnormal dentition. Dental caries and dental abscesses present.   Eyes:     Conjunctiva/sclera: Conjunctivae normal.  Cardiovascular:     Rate and Rhythm: Normal rate.     Pulses: Normal pulses.  Pulmonary:     Effort: Pulmonary effort is normal.  Abdominal:     General: Abdomen is flat.  Musculoskeletal:        General: Normal range of motion.     Cervical back: Normal range of motion.  Skin:    General: Skin is warm and dry.  Neurological:     General: No focal deficit present.     Mental Status: He is alert and oriented to person, place, and time.  Psychiatric:        Mood and Affect: Mood normal.     UC Treatments / Results  Labs (all labs ordered are listed, but only abnormal results are displayed) Labs Reviewed - No data to display  EKG   Radiology No results found.  Procedures Procedures (including critical care time)  Medications Ordered in UC Medications - No data to display  Initial Impression / Assessment and Plan / UC Course  I have reviewed the triage vital signs and the nursing notes.  Pertinent labs & imaging results that were available during my care of the patient were reviewed by me and considered in my medical decision making (see  chart for details).    Assessment negative for red flags or concerns.   Augmentin prescribed Recommend Ibuprofen and/or Tylenol as needed.  Prescribed viscous lidocaine for pain relief.  Recommend soft diet until evaluated by dentist Maintain oral hygiene care Follow up with dentist as soon as possible for further evaluation and treatment  Return or go to the ED if you have any new or worsening symptoms such as fever, chills, difficulty swallowing, painful swallowing, oral or neck swelling, nausea, vomiting, chest pain, SOB.  Reviewed expectations re: course of current medical issues. Questions answered. Outlined signs and symptoms indicating need for more acute intervention. Patient verbalized understanding. After Visit Summary given.  Final Clinical Impressions(s) / UC Diagnoses   Final diagnoses:  Dental abscess  Pain, dental     Discharge Instructions      Take the Augmentin twice a day for the next 7 days.    Take Ibuprofen and/or Tylenol as needed.  You can soak a cotton ball in the viscous lidocaine and place it on your tooth for pain relief.   Recommend soft diet until evaluated by dentist Maintain oral hygiene care  Follow up with dentist as soon as possible for further evaluation and treatment  Return or go to the ED if you have any new or worsening symptoms such as fever, chills, difficulty swallowing, painful swallowing, oral or neck swelling, nausea, vomiting, chest pain, SOB.       ED Prescriptions     Medication Sig Dispense Auth. Provider   amoxicillin-clavulanate (AUGMENTIN) 875-125 MG tablet Take 1 tablet by mouth every 12 (twelve) hours. 14 tablet Chales Salmon R, NP   lidocaine (XYLOCAINE) 2 % solution Use as directed 15 mLs in the mouth or throat as needed for mouth pain. 100 mL Ivette Loyal, NP      PDMP not reviewed this encounter.   Ivette Loyal, NP 11/25/20 [phone]

## 2020-11-25 NOTE — ED Triage Notes (Signed)
Pt c/o dental pain to left upper mouth. Pt states he had a chipped tooth that might be stuck in gum still.  Started: yesterday  Interventions: Motrin, orajel, none helpful

## 2020-11-25 NOTE — Discharge Instructions (Addendum)
Take the Augmentin twice a day for the next 7 days.    Take Ibuprofen and/or Tylenol as needed.  You can soak a cotton ball in the viscous lidocaine and place it on your tooth for pain relief.   Recommend soft diet until evaluated by dentist Maintain oral hygiene care  Follow up with dentist as soon as possible for further evaluation and treatment  Return or go to the ED if you have any new or worsening symptoms such as fever, chills, difficulty swallowing, painful swallowing, oral or neck swelling, nausea, vomiting, chest pain, SOB.

## 2021-01-02 ENCOUNTER — Ambulatory Visit (INDEPENDENT_AMBULATORY_CARE_PROVIDER_SITE_OTHER): Payer: Self-pay

## 2021-01-02 ENCOUNTER — Ambulatory Visit (HOSPITAL_COMMUNITY)
Admission: EM | Admit: 2021-01-02 | Discharge: 2021-01-02 | Disposition: A | Discharge status: Home / Self Care | Payer: Self-pay | Attending: Internal Medicine | Admitting: Internal Medicine

## 2021-01-02 ENCOUNTER — Other Ambulatory Visit: Payer: Self-pay

## 2021-01-02 ENCOUNTER — Encounter (HOSPITAL_COMMUNITY): Payer: Self-pay | Admitting: *Deleted

## 2021-01-02 DIAGNOSIS — W19XXXA Unspecified fall, initial encounter: Secondary | ICD-10-CM

## 2021-01-02 DIAGNOSIS — S63501A Unspecified sprain of right wrist, initial encounter: Secondary | ICD-10-CM

## 2021-01-02 DIAGNOSIS — M25531 Pain in right wrist: Secondary | ICD-10-CM

## 2021-01-02 NOTE — ED Triage Notes (Signed)
Pt reports he did not take BP med today.

## 2021-01-02 NOTE — Discharge Instructions (Addendum)
You can take Tylenol and or ibuprofen/Excedrin as needed for pain relief and fever reduction.  Rest as much as possible Ice for 10-15 minutes every 4-6 hours as needed for pain and swelling Compression- use an ace bandage or splint for comfort Elevate above your hip/heart when sitting and laying down  Follow up with sports medicine or orthopedics if symptoms do not improve in the next few weeks.

## 2021-01-02 NOTE — ED Triage Notes (Signed)
Pt fell in shower today and has Rt wrist pain

## 2021-01-02 NOTE — ED Provider Notes (Signed)
[facility]    CSN: [id number] Arrival date & time: 01/02/21  1924      History   Chief Complaint Chief Complaint  Patient presents with   Fall   Wrist Pain    HPI Dale Mcintosh is a 35 y.o. male.   Patient here for evaluation of right wrist pain after falling in the shower earlier today.  Reports pain on bilateral sides of wrist that is worse with movement.  Has not taken any OTC medications or treatments.  Denies any fevers, chest pain, shortness of breath, N/V/D, numbness, tingling, weakness, abdominal pain, or headaches.    The history is provided by the patient.  Fall  Wrist Pain   Past Medical History:  Diagnosis Date   Hypertension    OSA (obstructive sleep apnea)     Patient Active Problem List   Diagnosis Date Noted   Educated about COVID-19 virus infection 09/23/2018   Acute systolic HF (heart failure) (HCC) 09/23/2018   Hypertensive urgency 09/23/2018   Chest pain 08/28/2018   Excessive daytime sleepiness 02/01/2017   Morning headache 02/01/2017   Morbid obesity (HCC) 02/01/2017   Snoring 02/01/2017   Hypertensive emergency 02/01/2017    Past Surgical History:  Procedure Laterality Date   NO PAST SURGERIES     None         Home Medications    Prior to Admission medications   Medication Sig Start Date End Date Taking? Authorizing Provider  amLODipine (NORVASC) 10 MG tablet TAKE 1 TABLET (10 MG TOTAL) BY MOUTH DAILY. 10/23/20 10/23/21  Claiborne Rigg, NP  amoxicillin-clavulanate (AUGMENTIN) 875-125 MG tablet Take 1 tablet by mouth every 12 (twelve) hours. 11/25/20   Ivette Loyal, NP  carvedilol (COREG) 25 MG tablet TAKE 1 TABLET (25 MG TOTAL) BY MOUTH 2 (TWO) TIMES DAILY WITH A MEAL. 10/23/20 10/23/21  Claiborne Rigg, NP  cloNIDine (CATAPRES - DOSED IN MG/24 HR) 0.2 mg/24hr patch Place 1 patch (0.2 mg total) onto the skin once a week. 10/23/20   Claiborne Rigg, NP  lidocaine (XYLOCAINE) 2 % solution Use as directed 15 mLs in the  mouth or throat as needed for mouth pain. 11/25/20   Ivette Loyal, NP  losartan-hydrochlorothiazide (HYZAAR) 100-25 MG tablet TAKE 1 TABLET BY MOUTH DAILY. 10/23/20 10/23/21  Claiborne Rigg, NP  sildenafil (VIAGRA) 100 MG tablet Take 0.5-1 tablets (50-100 mg total) by mouth daily as needed for erectile dysfunction. 08/31/20   Claiborne Rigg, NP  hydrochlorothiazide (HYDRODIURIL) 25 MG tablet Take 1 tablet (25 mg total) by mouth daily. 06/27/20 07/21/20  Rema Fendt, NP  losartan (COZAAR) 100 MG tablet Take 1 tablet (100 mg total) by mouth daily. 06/27/20 07/21/20  Rema Fendt, NP  nitroGLYCERIN (NITROSTAT) 0.4 MG SL tablet Place 1 tablet (0.4 mg total) under the tongue every 5 (five) minutes as needed for chest pain (per CT heart protocol). Patient not taking: Reported on 11/02/2018 08/29/18 02/25/19  Glade Lloyd, MD    Family History Family History  Problem Relation Age of Onset   Hypertension Mother    Hypertension Father    Diabetes Father     Social History Social History   Tobacco Use   Smoking status: Every Day    Packs/day: 0.50    Types: Cigarettes   Smokeless tobacco: Current  Vaping Use   Vaping Use: Never used  Substance Use Topics   Alcohol use: Yes    Comment: occ   Drug  use: Yes    Types: Marijuana    Comment: daily     Allergies   Bee venom and Shrimp [shellfish allergy]   Review of Systems Review of Systems  Musculoskeletal:  Positive for arthralgias and joint swelling.  All other systems reviewed and are negative.   Physical Exam Triage Vital Signs ED Triage Vitals  Enc Vitals Group     BP 01/02/21 1934 (!) 190/127     Pulse Rate 01/02/21 1934 92     Resp 01/02/21 1934 20     Temp 01/02/21 1934 98.5 F (36.9 C)     Temp src --      SpO2 01/02/21 1934 98 %     Weight --      Height --      Head Circumference --      Peak Flow --      Pain Score 01/02/21 1935 6     Pain Loc --      Pain Edu? --      Excl. in GC? --    No data  found.  Updated Vital Signs BP (!) 190/127   Pulse 92   Temp 98.5 F (36.9 C)   Resp 20   SpO2 98%   Visual Acuity Right Eye Distance:   Left Eye Distance:   Bilateral Distance:    Right Eye Near:   Left Eye Near:    Bilateral Near:     Physical Exam Vitals and nursing note reviewed.  Constitutional:      General: He is not in acute distress.    Appearance: Normal appearance. He is not ill-appearing, toxic-appearing or diaphoretic.  HENT:     Head: Normocephalic and atraumatic.  Eyes:     Conjunctiva/sclera: Conjunctivae normal.  Cardiovascular:     Rate and Rhythm: Normal rate.     Pulses: Normal pulses.  Pulmonary:     Effort: Pulmonary effort is normal.  Abdominal:     General: Abdomen is flat.  Musculoskeletal:     Right wrist: Tenderness and bony tenderness present. No swelling or crepitus. Decreased range of motion (due to pain). Normal pulse.     Left wrist: Normal.     Cervical back: Normal range of motion.  Skin:    General: Skin is warm and dry.  Neurological:     General: No focal deficit present.     Mental Status: He is alert and oriented to person, place, and time.  Psychiatric:        Mood and Affect: Mood normal.     UC Treatments / Results  Labs (all labs ordered are listed, but only abnormal results are displayed) Labs Reviewed - No data to display  EKG   Radiology DG Wrist Complete Right  Result Date: 01/02/2021 CLINICAL DATA:  Fall with right wrist pain EXAM: RIGHT WRIST - COMPLETE 3+ VIEW COMPARISON:  None. FINDINGS: There is no evidence of fracture or dislocation. There is no evidence of arthropathy or other focal bone abnormality. Soft tissues are unremarkable. IMPRESSION: Negative. Electronically Signed   By: Deatra Robinson M.D.   On: 01/02/2021 20:10    Procedures Procedures (including critical care time)  Medications Ordered in UC Medications - No data to display  Initial Impression / Assessment and Plan / UC Course  I have  reviewed the triage vital signs and the nursing notes.  Pertinent labs & imaging results that were available during my care of the patient were reviewed by me and considered  in my medical decision making (see chart for details).    Assessment negative for red flags or concerns.  X-ray with no acute bony abnormality.  Ace bandage applied in office.  Likely right wrist sprain.  May take Tylenol and/or ibuprofen as needed for pain.  Recommend rest, ice, compression, and elevation.  Follow-up with orthopedics if symptoms do not improve in the next week. Final Clinical Impressions(s) / UC Diagnoses   Final diagnoses:  Sprain of right wrist, initial encounter  Fall, initial encounter     Discharge Instructions      You can take Tylenol and or ibuprofen/Excedrin as needed for pain relief and fever reduction.  Rest as much as possible Ice for 10-15 minutes every 4-6 hours as needed for pain and swelling Compression- use an ace bandage or splint for comfort Elevate above your hip/heart when sitting and laying down  Follow up with sports medicine or orthopedics if symptoms do not improve in the next few weeks.      ED Prescriptions   None    PDMP not reviewed this encounter.   Ivette Loyal, NP 01/02/21 2022

## 2021-01-17 ENCOUNTER — Other Ambulatory Visit: Payer: Self-pay

## 2021-01-18 ENCOUNTER — Other Ambulatory Visit: Payer: Self-pay

## 2021-02-28 ENCOUNTER — Emergency Department (HOSPITAL_COMMUNITY)
Admission: EM | Admit: 2021-02-28 | Discharge: 2021-02-28 | Disposition: A | Discharge status: Home / Self Care | Payer: Self-pay | Attending: Emergency Medicine | Admitting: Emergency Medicine

## 2021-02-28 ENCOUNTER — Encounter (HOSPITAL_COMMUNITY): Payer: Self-pay | Admitting: Emergency Medicine

## 2021-02-28 ENCOUNTER — Other Ambulatory Visit: Payer: Self-pay

## 2021-02-28 DIAGNOSIS — F17210 Nicotine dependence, cigarettes, uncomplicated: Secondary | ICD-10-CM | POA: Insufficient documentation

## 2021-02-28 DIAGNOSIS — I5021 Acute systolic (congestive) heart failure: Secondary | ICD-10-CM | POA: Insufficient documentation

## 2021-02-28 DIAGNOSIS — T2122XA Burn of second degree of abdominal wall, initial encounter: Secondary | ICD-10-CM | POA: Insufficient documentation

## 2021-02-28 DIAGNOSIS — I10 Essential (primary) hypertension: Secondary | ICD-10-CM

## 2021-02-28 DIAGNOSIS — Z79899 Other long term (current) drug therapy: Secondary | ICD-10-CM | POA: Insufficient documentation

## 2021-02-28 DIAGNOSIS — X102XXA Contact with fats and cooking oils, initial encounter: Secondary | ICD-10-CM | POA: Insufficient documentation

## 2021-02-28 DIAGNOSIS — Z23 Encounter for immunization: Secondary | ICD-10-CM | POA: Insufficient documentation

## 2021-02-28 DIAGNOSIS — I110 Hypertensive heart disease with heart failure: Secondary | ICD-10-CM | POA: Insufficient documentation

## 2021-02-28 MED ORDER — TETANUS-DIPHTH-ACELL PERTUSSIS 5-2.5-18.5 LF-MCG/0.5 IM SUSY
0.5000 mL | PREFILLED_SYRINGE | Freq: Once | INTRAMUSCULAR | Status: AC
  Administered 2021-02-28: 0.5 mL via INTRAMUSCULAR
  Filled 2021-02-28: qty 0.5

## 2021-02-28 MED ORDER — BACITRACIN ZINC 500 UNIT/GM EX OINT
1.0000 "application " | TOPICAL_OINTMENT | Freq: Two times a day (BID) | CUTANEOUS | Status: DC
  Administered 2021-02-28: 1 via TOPICAL
  Filled 2021-02-28: qty 0.9

## 2021-02-28 MED ORDER — BACITRACIN ZINC 500 UNIT/GM EX OINT: 1.0000 "application " | TOPICAL_OINTMENT | Freq: Two times a day (BID) | CUTANEOUS | 0 refills | Status: DC
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
## 2021-02-28 NOTE — Discharge Instructions (Addendum)
We recommend that you change her dressing at least once per day to keep the area clean.  Apply bacitracin twice a day as prescribed.  Use ibuprofen or Tylenol for management of pain.  Return for new or concerning symptoms.

## 2021-03-19 ENCOUNTER — Other Ambulatory Visit: Payer: Self-pay

## 2021-03-20 ENCOUNTER — Other Ambulatory Visit: Payer: Self-pay

## 2021-03-26 ENCOUNTER — Other Ambulatory Visit: Payer: Self-pay

## 2021-03-27 ENCOUNTER — Other Ambulatory Visit: Payer: Self-pay

## 2021-03-28 ENCOUNTER — Other Ambulatory Visit: Payer: Self-pay

## 2021-05-08 ENCOUNTER — Other Ambulatory Visit: Payer: Self-pay | Admitting: Nurse Practitioner

## 2021-05-08 DIAGNOSIS — I10 Essential (primary) hypertension: Secondary | ICD-10-CM

## 2021-05-08 NOTE — Telephone Encounter (Signed)
Medication Refill - Medication: Stated he is completely out and tried to contact pharmacy prior but it was closed for the holiday.    amLODipine (NORVASC) 10 MG tablet [id number]   carvedilol (COREG) 25 MG tablet [id number]  losartan-hydrochlorothiazide (HYZAAR) 100-25 MG tablet [id number]     cloNIDine (CATAPRES - DOSED IN MG/24 HR) 0.2 mg/24hr patch [id number]  Has the patient contacted their pharmacy? Yes.   (Agent: If no, request that the patient contact the pharmacy for the refill. If patient does not wish to contact the pharmacy document the reason why and proceed with request.) (Agent: If yes, when and what did the pharmacy advise?)  Preferred Pharmacy (with phone number or street name): [address]. [place], [place] [address]  Phone:  [phone]  Fax:  [phone]  Has the patient been seen for an appointment in the last year OR does the patient have an upcoming appointment? Yes.    Agent: Please be advised that RX refills may take up to 3 business days. We ask that you follow-up with your pharmacy.

## 2021-05-09 ENCOUNTER — Other Ambulatory Visit: Payer: Self-pay

## 2021-05-09 MED ORDER — CLONIDINE 0.2 MG/24HR TD PTWK
0.2000 mg | MEDICATED_PATCH | TRANSDERMAL | 0 refills | Status: DC
  Filled 2021-05-09 – 2021-05-15 (×2): qty 4, 28d supply, fill #0

## 2021-05-09 MED ORDER — LOSARTAN POTASSIUM-HCTZ 100-25 MG PO TABS
1.0000 | ORAL_TABLET | Freq: Every day | ORAL | 0 refills | Status: DC
  Filled 2021-05-09 – 2021-05-15 (×2): qty 30, 30d supply, fill #0

## 2021-05-09 MED ORDER — CARVEDILOL 25 MG PO TABS
ORAL_TABLET | Freq: Two times a day (BID) | ORAL | 0 refills | Status: DC
  Filled 2021-05-09: qty 60, fill #0
  Filled 2021-05-15: qty 60, 30d supply, fill #0

## 2021-05-09 MED ORDER — AMLODIPINE BESYLATE 10 MG PO TABS
ORAL_TABLET | Freq: Every day | ORAL | 0 refills | Status: DC
  Filled 2021-05-09: qty 30, fill #0
  Filled 2021-05-15: qty 30, 30d supply, fill #0

## 2021-05-09 NOTE — Telephone Encounter (Signed)
Courtesy refill. Patient will need an office visit for further refills. Requested Prescriptions  Pending Prescriptions Disp Refills   amLODipine (NORVASC) 10 MG tablet 30 tablet 0    Sig: TAKE 1 TABLET (10 MG TOTAL) BY MOUTH DAILY.     Cardiovascular:  Calcium Channel Blockers Failed - 05/08/2021  7:07 PM      Failed - Last BP in normal range    BP Readings from Last 1 Encounters:  02/28/21 (!) 187/139         Failed - Valid encounter within last 6 months    Recent Outpatient Visits          6 months ago Primary hypertension   Dale Mcintosh, Dale Mcintosh, Dale Mcintosh   8 months ago Essential hypertension   Dale Mcintosh, Dale Mcintosh, Dale Mcintosh   8 months ago Essential hypertension   Dale Mcintosh, Dale Mcintosh   9 months ago Essential hypertension   Dale Mcintosh, Dale Mcintosh   9 months ago Essential hypertension   Dale Mcintosh, Dale Mcintosh              carvedilol (COREG) 25 MG tablet 60 tablet 0    Sig: TAKE 1 TABLET (25 MG TOTAL) BY MOUTH 2 (TWO) TIMES DAILY WITH A MEAL.     Cardiovascular:  Beta Blockers Failed - 05/08/2021  7:07 PM      Failed - Last BP in normal range    BP Readings from Last 1 Encounters:  02/28/21 (!) 187/139         Failed - Valid encounter within last 6 months    Recent Outpatient Visits          6 months ago Primary hypertension   Dale Mcintosh, Dale Mcintosh, Dale Mcintosh   8 months ago Essential hypertension   Dale Mcintosh, Dale Mcintosh, Dale Mcintosh   8 months ago Essential hypertension   Dale Mcintosh, Dale Mcintosh, Dale Mcintosh   9 months ago Essential hypertension   Dale Mcintosh, Dale Mcintosh   9 months ago Essential  hypertension   Dale Mcintosh, Dale Mcintosh W, Dale Mcintosh             Passed - Last Heart Rate in normal range    Pulse Readings from Last 1 Encounters:  02/28/21 80          cloNIDine (CATAPRES - DOSED IN MG/24 HR) 0.2 mg/24hr patch 4 patch 0    Sig: Place 1 patch (0.2 mg total) onto the skin once a week.     Cardiovascular:  Alpha-2 Agonists Failed - 05/08/2021  7:07 PM      Failed - Last BP in normal range    BP Readings from Last 1 Encounters:  02/28/21 (!) 187/139         Failed - Valid encounter within last 6 months    Recent Outpatient Visits          6 months ago Primary hypertension   Dale Mcintosh, Dale Mcintosh, Dale Mcintosh   8 months ago Essential hypertension   Dale Mcintosh, Dale Mcintosh, Dale Mcintosh   8 months ago Essential hypertension  Dale Mcintosh, Dale Mcintosh, Dale Mcintosh   9 months ago Essential hypertension   Dale Mcintosh, Dale Mcintosh, Dale Mcintosh   9 months ago Essential hypertension   Mcintosh Dale Mcintosh, Dale Mcintosh W, Dale Mcintosh             Passed - Last Heart Rate in normal range    Pulse Readings from Last 1 Encounters:  02/28/21 80          losartan-hydrochlorothiazide (HYZAAR) 100-25 MG tablet 30 tablet 0    Sig: TAKE 1 TABLET BY MOUTH DAILY.     Cardiovascular: ARB + Diuretic Combos Failed - 05/08/2021  7:07 PM      Failed - K in normal range and within 180 days    Potassium  Date Value Ref Range Status  10/05/2020 3.3 (Mcintosh) 3.5 - 5.1 mmol/Mcintosh Final         Failed - Na in normal range and within 180 days    Sodium  Date Value Ref Range Status  10/05/2020 137 135 - 145 mmol/Mcintosh Final  07/03/2020 141 134 - 144 mmol/Mcintosh Final         Failed - Cr in normal range and within 180 days    Creatinine, Ser  Date Value Ref Range Status  10/05/2020 1.43 (H) 0.61 - 1.24 mg/dL Final          Failed - Ca in normal range and within 180 days    Calcium  Date Value Ref Range Status  10/05/2020 9.0 8.9 - 10.3 mg/dL Final   Calcium, Ion  Date Value Ref Range Status  09/30/2018 1.11 (Mcintosh) 1.15 - 1.40 mmol/Mcintosh Final         Failed - Last BP in normal range    BP Readings from Last 1 Encounters:  02/28/21 (!) 187/139         Failed - Valid encounter within last 6 months    Recent Outpatient Visits          6 months ago Primary hypertension   Dale Mcintosh, Dale Mcintosh, Dale Mcintosh   8 months ago Essential hypertension   Dale Mcintosh, Dale Mcintosh, Dale Mcintosh   8 months ago Essential hypertension   Dale Mcintosh, Dale Mcintosh, Dale Mcintosh   9 months ago Essential hypertension   Dale Mcintosh, Dale Mcintosh, Dale Mcintosh   9 months ago Essential hypertension   Dale Mcintosh, Dale Mcintosh, Dale Mcintosh             Passed - Patient is not pregnant

## 2021-05-15 ENCOUNTER — Other Ambulatory Visit: Payer: Self-pay

## 2021-05-15 ENCOUNTER — Encounter (HOSPITAL_COMMUNITY): Payer: Self-pay | Admitting: Emergency Medicine

## 2021-05-15 ENCOUNTER — Ambulatory Visit (HOSPITAL_COMMUNITY)
Admission: EM | Admit: 2021-05-15 | Discharge: 2021-05-15 | Disposition: A | Discharge status: Home / Self Care | Payer: Self-pay | Attending: Emergency Medicine | Admitting: Emergency Medicine

## 2021-05-15 DIAGNOSIS — I10 Essential (primary) hypertension: Secondary | ICD-10-CM | POA: Insufficient documentation

## 2021-05-15 DIAGNOSIS — J029 Acute pharyngitis, unspecified: Secondary | ICD-10-CM | POA: Insufficient documentation

## 2021-05-15 LAB — POCT RAPID STREP A, ED / UC: Streptococcus, Group A Screen (Direct): NEGATIVE

## 2021-05-15 MED ORDER — IBUPROFEN 800 MG PO TABS: 800.0000 mg | ORAL_TABLET | Freq: Three times a day (TID) | ORAL | 0 refills | Status: DC

## 2021-05-15 MED ORDER — LIDOCAINE VISCOUS HCL 2 % MT SOLN: 15.0000 mL | OROMUCOSAL | 0 refills | Status: DC | PRN

## 2021-05-15 NOTE — ED Triage Notes (Signed)
Sore throat for a week.  Has been taking nyquil, dayquil, halls cough drops.    Reports tongue last night and thinks it is swollen slightly now. Patient is able to eat and drink, but it hurts to swallow.

## 2021-05-15 NOTE — Discharge Instructions (Signed)
Your rapid strep test today was negative, your throat sample has been sent to the lab to see if it will grow bacteria, if this occurs you will be notified and antibiotic be sent in for use ° °In the meantime we must treat this as a viral symptom and manage the symptoms ° °You may gargle and spit lidocaine solution every 4 hours as needed for temporary relief of your sore throat ° °May use ibuprofen every 8 hours as needed in addition to Tylenol for additional comfort ° °You may follow-up at urgent care as needed  °

## 2021-05-15 NOTE — ED Provider Notes (Signed)
[facility]    CSN: [id number] Arrival date & time: 05/15/21  1156      History   Chief Complaint Chief Complaint  Patient presents with   Sore Throat    HPI Dale Mcintosh is a 36 y.o. male.   Patient presents with sore throat for 1 weeks. Painful to swallow but able to tolerate food and liquids. No known sick contacts.  Denies fever, chills, body aches, nasal congestion, rhinorrhea, cough, shortness of breath, wheezing, ear pain or facial swelling.  Endorses generalized headache that began today.  Has attempted use of DayQuil/NyQuil and cough drops which have not been helpful.  History of hypertension and sleep apnea.  Past Medical History:  Diagnosis Date   Hypertension    OSA (obstructive sleep apnea)     Patient Active Problem List   Diagnosis Date Noted   Educated about COVID-19 virus infection 09/23/2018   Acute systolic HF (heart failure) (HCC) 09/23/2018   Hypertensive urgency 09/23/2018   Chest pain 08/28/2018   Excessive daytime sleepiness 02/01/2017   Morning headache 02/01/2017   Morbid obesity (HCC) 02/01/2017   Snoring 02/01/2017   Hypertensive emergency 02/01/2017    Past Surgical History:  Procedure Laterality Date   NO PAST SURGERIES     None         Home Medications    Prior to Admission medications   Medication Sig Start Date End Date Taking? Authorizing Provider  amLODipine (NORVASC) 10 MG tablet TAKE 1 TABLET (10 MG TOTAL) BY MOUTH DAILY. 05/09/21 05/09/22  Claiborne Rigg, NP  amoxicillin-clavulanate (AUGMENTIN) 875-125 MG tablet Take 1 tablet by mouth every 12 (twelve) hours. Patient not taking: Reported on 05/15/2021 11/25/20   Ivette Loyal, NP  bacitracin ointment Apply 1 application topically 2 (two) times daily. 02/28/21   Antony Madura, PA-C  carvedilol (COREG) 25 MG tablet TAKE 1 TABLET (25 MG TOTAL) BY MOUTH 2 (TWO) TIMES DAILY WITH A MEAL. 05/09/21 05/09/22  Claiborne Rigg, NP  cloNIDine (CATAPRES - DOSED IN MG/24 HR) 0.2  mg/24hr patch Place 1 patch (0.2 mg total) onto the skin once a week. 05/09/21   Claiborne Rigg, NP  lidocaine (XYLOCAINE) 2 % solution Use as directed 15 mLs in the mouth or throat as needed for mouth pain. Patient not taking: Reported on 05/15/2021 11/25/20   Ivette Loyal, NP  losartan-hydrochlorothiazide (HYZAAR) 100-25 MG tablet TAKE 1 TABLET BY MOUTH DAILY. 05/09/21 05/09/22  Claiborne Rigg, NP  sildenafil (VIAGRA) 100 MG tablet Take 0.5-1 tablets (50-100 mg total) by mouth daily as needed for erectile dysfunction. 08/31/20   Claiborne Rigg, NP  hydrochlorothiazide (HYDRODIURIL) 25 MG tablet Take 1 tablet (25 mg total) by mouth daily. 06/27/20 07/21/20  Rema Fendt, NP  losartan (COZAAR) 100 MG tablet Take 1 tablet (100 mg total) by mouth daily. 06/27/20 07/21/20  Rema Fendt, NP  nitroGLYCERIN (NITROSTAT) 0.4 MG SL tablet Place 1 tablet (0.4 mg total) under the tongue every 5 (five) minutes as needed for chest pain (per CT heart protocol). Patient not taking: Reported on 11/02/2018 08/29/18 02/25/19  Glade Lloyd, MD    Family History Family History  Problem Relation Age of Onset   Hypertension Mother    Hypertension Father    Diabetes Father     Social History Social History   Tobacco Use   Smoking status: Former    Packs/day: 0.50    Types: Cigarettes   Smokeless tobacco: Current  Vaping  Use   Vaping Use: Never used  Substance Use Topics   Alcohol use: Yes    Comment: occ   Drug use: Yes    Types: Marijuana    Comment: daily     Allergies   Bee venom and Shrimp [shellfish allergy]   Review of Systems Review of Systems  Constitutional: Negative.   HENT:  Positive for sore throat. Negative for congestion, dental problem, drooling, ear discharge, ear pain, facial swelling, hearing loss, mouth sores, nosebleeds, postnasal drip, rhinorrhea, sinus pressure, sinus pain, sneezing, tinnitus, trouble swallowing and voice change.   Respiratory: Negative.     Cardiovascular: Negative.   Gastrointestinal: Negative.   Neurological:  Positive for headaches. Negative for dizziness, tremors, seizures, syncope, facial asymmetry, speech difficulty, weakness, light-headedness and numbness.    Physical Exam Triage Vital Signs ED Triage Vitals  Enc Vitals Group     BP 05/15/21 1231 (!) 209/129     Pulse Rate 05/15/21 1231 95     Resp 05/15/21 1231 (!) 22     Temp 05/15/21 1231 99 F (37.2 C)     Temp Source 05/15/21 1231 Oral     SpO2 05/15/21 1231 96 %     Weight --      Height --      Head Circumference --      Peak Flow --      Pain Score 05/15/21 1229 10     Pain Loc --      Pain Edu? --      Excl. in [place]? --    No data found.  Updated Vital Signs BP (!) 209/129 (BP Location: Left Arm) Comment (BP Location): large cuff-no medicine for 2 days   Pulse 95    Temp 99 F (37.2 C) (Oral)    Resp (!) 22    SpO2 96%   Visual Acuity Right Eye Distance:   Left Eye Distance:   Bilateral Distance:    Right Eye Near:   Left Eye Near:    Bilateral Near:     Physical Exam Constitutional:      Appearance: He is well-developed and normal weight.  HENT:     Head: Normocephalic.     Right Ear: Tympanic membrane and ear canal normal.     Left Ear: Tympanic membrane and ear canal normal.     Nose: No congestion.     Mouth/Throat:     Mouth: Mucous membranes are moist.     Pharynx: Posterior oropharyngeal erythema present.     Tonsils: No tonsillar exudate. 0 on the right. 0 on the left.  Cardiovascular:     Rate and Rhythm: Normal rate and regular rhythm.     Heart sounds: Normal heart sounds.  Pulmonary:     Effort: Pulmonary effort is normal.     Breath sounds: Normal breath sounds.  Musculoskeletal:     Cervical back: Normal range of motion.  Lymphadenopathy:     Cervical: Cervical adenopathy present.  Skin:    General: Skin is warm and dry.  Neurological:     General: No focal deficit present.     Mental Status: He is alert and  oriented to person, place, and time.  Psychiatric:        Mood and Affect: Mood normal.        Behavior: Behavior normal.     UC Treatments / Results  Labs (all labs ordered are listed, but only abnormal results are displayed) Labs Reviewed - No  data to display  EKG   Radiology No results found.  Procedures Procedures (including critical care time)  Medications Ordered in UC Medications - No data to display  Initial Impression / Assessment and Plan / UC Course  I have reviewed the triage vital signs and the nursing notes.  Pertinent labs & imaging results that were available during my care of the patient were reviewed by me and considered in my medical decision making (see chart for details).  Viral pharyngitis Elevated blood pressure reading in office with diagnosis of hypertension  Rapid strep negative, sent for culture, discussed etiology of symptoms, timeline and possible resolution with patient, viscous lidocaine and ibuprofen 800 mg prescribed for outpatient management, may use salt water gargles, throat lozenges, warm teas and honey in addition, work note given  Blood pressure elevated in triage 209/129, patient endorses that he ran out of his medication 2 days ago and plans to pick up from pharmacy today, discussed that headache is most likely a result of elevated blood pressure reading, no further signs of hypertensive urgency, given strict return precautions to go to the nearest emergency department if new symptoms occur or if headache worsens in severity  Final Clinical Impressions(s) / UC Diagnoses   Final diagnoses:  None   Discharge Instructions   None    ED Prescriptions   None    PDMP not reviewed this encounter.   Hans Eden, NP 05/15/21 1259

## 2021-05-17 LAB — CULTURE, GROUP A STREP (THRC)

## 2021-05-22 IMAGING — DX ABDOMEN - 1 VIEW
1 series · 1 of 1 positions shown · non-contrast
Comparison: None.

CLINICAL DATA: 32-year-old male with lower abdominal pain. Evaluate
for constipation.

EXAM:
ABDOMEN - 1 VIEW

[abdomen kub]
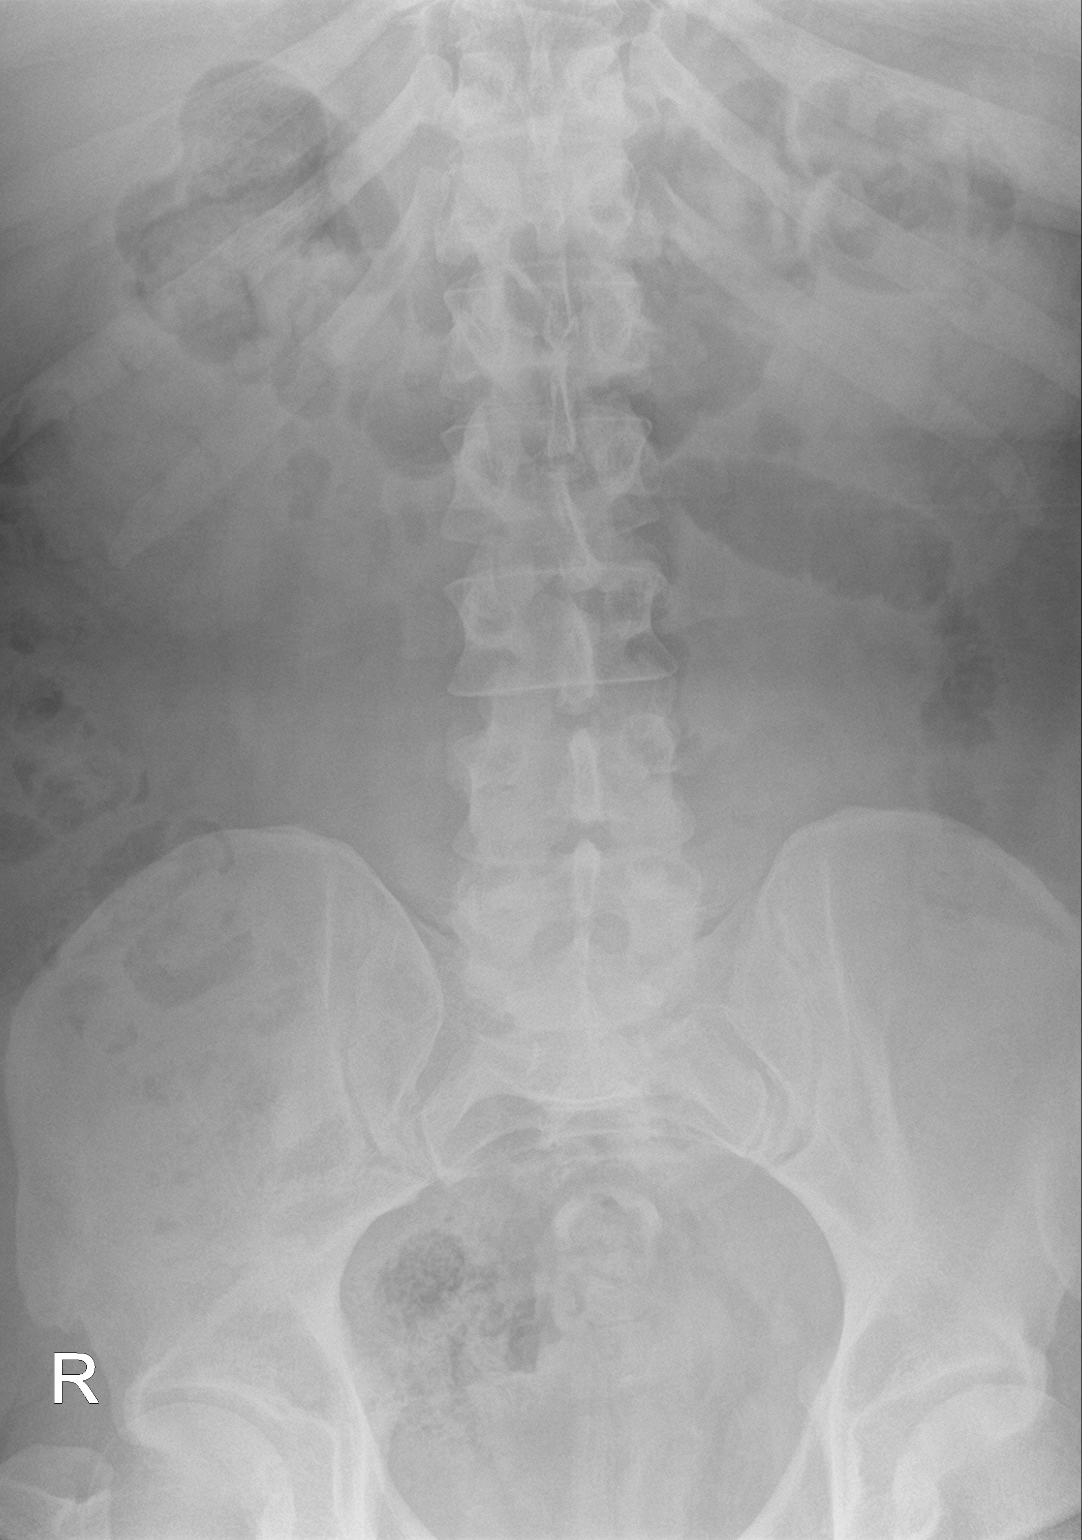

[1 of 1 positions shown; findings below may reference images not displayed]

FINDINGS: There is moderate amount of stool throughout the colon. No bowel
dilatation or evidence of obstruction. No free air or radiopaque
calculi identified on the provided image. The osseous structures and
soft tissues are unremarkable.
IMPRESSION: Moderate colonic stool burden.  No bowel obstruction.

## 2021-06-06 ENCOUNTER — Encounter: Payer: Self-pay | Admitting: Physician Assistant

## 2021-06-06 ENCOUNTER — Other Ambulatory Visit: Payer: Self-pay

## 2021-06-06 ENCOUNTER — Ambulatory Visit: Payer: Self-pay | Attending: Physician Assistant | Admitting: Physician Assistant

## 2021-06-06 VITALS — BP 147/99 | HR 88 | Resp 16 | Wt 309.0 lb

## 2021-06-06 DIAGNOSIS — Z13220 Encounter for screening for lipoid disorders: Secondary | ICD-10-CM

## 2021-06-06 DIAGNOSIS — F17200 Nicotine dependence, unspecified, uncomplicated: Secondary | ICD-10-CM

## 2021-06-06 DIAGNOSIS — R739 Hyperglycemia, unspecified: Secondary | ICD-10-CM

## 2021-06-06 DIAGNOSIS — Z09 Encounter for follow-up examination after completed treatment for conditions other than malignant neoplasm: Secondary | ICD-10-CM

## 2021-06-06 DIAGNOSIS — I10 Essential (primary) hypertension: Secondary | ICD-10-CM

## 2021-06-06 MED ORDER — CARVEDILOL 25 MG PO TABS
ORAL_TABLET | Freq: Two times a day (BID) | ORAL | 4 refills | Status: DC
  Filled 2021-06-06 – 2021-06-20 (×2): qty 60, 30d supply, fill #0
  Filled 2021-07-30: qty 60, 30d supply, fill #1
  Filled 2021-08-31: qty 60, 30d supply, fill #2
  Filled 2021-10-19: qty 60, 30d supply, fill #3
  Filled 2021-11-28: qty 60, 30d supply, fill #4

## 2021-06-06 MED ORDER — CLONIDINE 0.2 MG/24HR TD PTWK
0.2000 mg | MEDICATED_PATCH | TRANSDERMAL | 4 refills | Status: DC
  Filled 2021-06-06 – 2021-06-20 (×2): qty 4, 28d supply, fill #0
  Filled 2021-08-31 – 2021-11-28 (×3): qty 4, 28d supply, fill #1

## 2021-06-06 MED ORDER — AMLODIPINE BESYLATE 10 MG PO TABS
ORAL_TABLET | Freq: Every day | ORAL | 4 refills | Status: DC
  Filled 2021-06-06 – 2021-06-20 (×2): qty 30, 30d supply, fill #0
  Filled 2021-07-30: qty 30, 30d supply, fill #1
  Filled 2021-08-31: qty 30, 30d supply, fill #2
  Filled 2021-10-19: qty 30, 30d supply, fill #3
  Filled 2021-11-28: qty 30, 30d supply, fill #4

## 2021-06-06 MED ORDER — LOSARTAN POTASSIUM-HCTZ 100-25 MG PO TABS
1.0000 | ORAL_TABLET | Freq: Every day | ORAL | 4 refills | Status: DC
  Filled 2021-06-06 – 2021-06-20 (×2): qty 30, 30d supply, fill #0

## 2021-06-06 NOTE — Patient Instructions (Signed)
Smoking Tobacco Information, Adult Smoking tobacco can be harmful to your health. Tobacco contains a poisonous (toxic), colorless chemical called nicotine. Nicotine is addictive. It changes the brain and can make it hard to stop smoking. Tobacco also has other toxic chemicals that can hurt your body and raise your risk of many cancers. How can smoking tobacco affect me? Smoking tobacco puts you at risk for: Cancer. Smoking is most commonly associated with lung cancer, but can also lead to cancer in other parts of the body. Chronic obstructive pulmonary disease (COPD). This is a long-term lung condition that makes it hard to breathe. It also gets worse over time. High blood pressure (hypertension), heart disease, stroke, or heart attack. Lung infections, such as pneumonia. Cataracts. This is when the lenses in the eyes become clouded. Digestive problems. This may include peptic ulcers, heartburn, and gastroesophageal reflux disease (GERD). Oral health problems, such as gum disease and tooth loss. Loss of taste and smell. Smoking can affect your appearance by causing: Wrinkles. Yellow or stained teeth, fingers, and fingernails. Smoking tobacco can also affect your social life, because: It may be challenging to find places to smoke when away from home. Many workplaces, [employer], hotels, and public places are tobacco-free. Smoking is expensive. This is due to the cost of tobacco and the long-term costs of treating health problems from smoking. Secondhand smoke may affect those around you. Secondhand smoke can cause lung cancer, breathing problems, and heart disease. Children of smokers have a higher risk for: Sudden infant death syndrome (SIDS). Ear infections. Lung infections. If you currently smoke tobacco, quitting now can help you: Lead a longer and healthier life. Look, smell, breathe, and feel better over time. Save money. Protect others from the harms of secondhand smoke. What  actions can I take to prevent health problems? Quit smoking  Do not start smoking. Quit if you already do. Make a plan to quit smoking and commit to it. Look for programs to help you and ask your health care provider for recommendations and ideas. Set a date and write down all the reasons you want to quit. Let your friends and family know you are quitting so they can help and support you. Consider finding friends who also want to quit. It can be easier to quit with someone else, so that you can support each other. Talk with your health care provider about using nicotine replacement medicines to help you quit, such as gum, lozenges, patches, sprays, or pills. Do not replace cigarette smoking with electronic cigarettes, which are commonly called e-cigarettes. The safety of e-cigarettes is not known, and some may contain harmful chemicals. If you try to quit but return to smoking, stay positive. It is common to slip up when you first quit, so take it one day at a time. Be prepared for cravings. When you feel the urge to smoke, chew gum or suck on hard candy. Lifestyle Stay busy and take care of your body. Drink enough fluid to keep your urine pale yellow. Get plenty of exercise and eat a healthy diet. This can help prevent weight gain after quitting. Monitor your eating habits. Quitting smoking can cause you to have a larger appetite than when you smoke. Find ways to relax. Go out with friends or family to a movie or a restaurant where people do not smoke. Ask your health care provider about having regular tests (screenings) to check for cancer. This may include blood tests, imaging tests, and other tests. Find ways to manage  your stress, such as meditation, yoga, or exercise. Where to find support To get support to quit smoking, consider: Asking your health care provider for more information and resources. Taking classes to learn more about quitting smoking. Looking for local organizations that  offer resources about quitting smoking. Joining a support group for people who want to quit smoking in your local community. Calling the smokefree.gov counselor helpline: 1-800-Quit-Now 423-570-7199) Where to find more information You may find more information about quitting smoking from: HelpGuide.org: www.helpguide.org [employer]: smokefree.gov American Lung Association: www.lung.org Contact a health care provider if you: Have problems breathing. Notice that your lips, nose, or fingers turn blue. Have chest pain. Are coughing up blood. Feel faint or you pass out. Have other health changes that cause you to worry. Summary Smoking tobacco can negatively affect your health, the health of those around you, your finances, and your social life. Do not start smoking. Quit if you already do. If you need help quitting, ask your health care provider. Think about joining a support group for people who want to quit smoking in your local community. There are many effective programs that will help you to quit this behavior. This information is not intended to replace advice given to you by your health care provider. Make sure you discuss any questions you have with your health care provider. Document Revised: 12/25/2020 Document Reviewed: 03/14/2020 Elsevier Patient Education  2022 [employer] Hypertension, Adult High blood pressure (hypertension) is when the force of blood pumping through the arteries is too strong. The arteries are the blood vessels that carry blood from the heart throughout the body. Hypertension forces the heart to work harder to pump blood and may cause arteries to become narrow or stiff. Untreated or uncontrolled hypertension can cause a heart attack, heart failure, a stroke, kidney disease, and other problems. A blood pressure reading consists of a higher number over a lower number. Ideally, your blood pressure should be below 120/80. The first ("top") number is called  the systolic pressure. It is a measure of the pressure in your arteries as your heart beats. The second ("bottom") number is called the diastolic pressure. It is a measure of the pressure in your arteries as the heart relaxes. What are the causes? The exact cause of this condition is not known. There are some conditions that result in or are related to high blood pressure. What increases the risk? Some risk factors for high blood pressure are under your control. The following factors may make you more likely to develop this condition: Smoking. Having type 2 diabetes mellitus, high cholesterol, or both. Not getting enough exercise or physical activity. Being overweight. Having too much fat, sugar, calories, or salt (sodium) in your diet. Drinking too much alcohol. Some risk factors for high blood pressure may be difficult or impossible to change. Some of these factors include: Having chronic kidney disease. Having a family history of high blood pressure. Age. Risk increases with age. Race. You may be at higher risk if you are African American. Gender. Men are at higher risk than women before age 42. After age 41, women are at higher risk than men. Having obstructive sleep apnea. Stress. What are the signs or symptoms? High blood pressure may not cause symptoms. Very high blood pressure (hypertensive crisis) may cause: Headache. Anxiety. Shortness of breath. Nosebleed. Nausea and vomiting. Vision changes. Severe chest pain. Seizures. How is this diagnosed? This condition is diagnosed by measuring your blood pressure while you are seated, with  your arm resting on a flat surface, your legs uncrossed, and your feet flat on the floor. The cuff of the blood pressure monitor will be placed directly against the skin of your upper arm at the level of your heart. It should be measured at least twice using the same arm. Certain conditions can cause a difference in blood pressure between your right  and left arms. Certain factors can cause blood pressure readings to be lower or higher than normal for a short period of time: When your blood pressure is higher when you are in a health care provider's office than when you are at home, this is called white coat hypertension. Most people with this condition do not need medicines. When your blood pressure is higher at home than when you are in a health care provider's office, this is called masked hypertension. Most people with this condition may need medicines to control blood pressure. If you have a high blood pressure reading during one visit or you have normal blood pressure with other risk factors, you may be asked to: Return on a different day to have your blood pressure checked again. Monitor your blood pressure at home for 1 week or longer. If you are diagnosed with hypertension, you may have other blood or imaging tests to help your health care provider understand your overall risk for other conditions. How is this treated? This condition is treated by making healthy lifestyle changes, such as eating healthy foods, exercising more, and reducing your alcohol intake. Your health care provider may prescribe medicine if lifestyle changes are not enough to get your blood pressure under control, and if: Your systolic blood pressure is above 130. Your diastolic blood pressure is above 80. Your personal target blood pressure may vary depending on your medical conditions, your age, and other factors. Follow these instructions at home: Eating and drinking  Eat a diet that is high in fiber and potassium, and low in sodium, added sugar, and fat. An example eating plan is called the DASH (Dietary Approaches to Stop Hypertension) diet. To eat this way: Eat plenty of fresh fruits and vegetables. Try to fill one half of your plate at each meal with fruits and vegetables. Eat whole grains, such as whole-wheat pasta, brown rice, or whole-grain bread. Fill  about one fourth of your plate with whole grains. Eat or drink low-fat dairy products, such as skim milk or low-fat yogurt. Avoid fatty cuts of meat, processed or cured meats, and poultry with skin. Fill about one fourth of your plate with lean proteins, such as fish, chicken without skin, beans, eggs, or tofu. Avoid pre-made and processed foods. These tend to be higher in sodium, added sugar, and fat. Reduce your daily sodium intake. Most people with hypertension should eat less than 1,500 mg of sodium a day. Do not drink alcohol if: Your health care provider tells you not to drink. You are pregnant, may be pregnant, or are planning to become pregnant. If you drink alcohol: Limit how much you use to: 0-1 drink a day for women. 0-2 drinks a day for men. Be aware of how much alcohol is in your drink. In the [place] one drink equals one 12 oz bottle of beer (355 mL), one 5 oz glass of wine (148 mL), or one 1 oz glass of hard liquor (44 mL). Lifestyle  Work with your health care provider to maintain a healthy body weight or to lose weight. Ask what an ideal weight is for you.  Get at least 30 minutes of exercise most days of the week. Activities may include walking, swimming, or biking. Include exercise to strengthen your muscles (resistance exercise), such as Pilates or lifting weights, as part of your weekly exercise routine. Try to do these types of exercises for 30 minutes at least 3 days a week. Do not use any products that contain nicotine or tobacco, such as cigarettes, e-cigarettes, and chewing tobacco. If you need help quitting, ask your health care provider. Monitor your blood pressure at home as told by your health care provider. Keep all follow-up visits as told by your health care provider. This is important. Medicines Take over-the-counter and prescription medicines only as told by your health care provider. Follow directions carefully. Blood pressure medicines must be taken as  prescribed. Do not skip doses of blood pressure medicine. Doing this puts you at risk for problems and can make the medicine less effective. Ask your health care provider about side effects or reactions to medicines that you should watch for. Contact a health care provider if you: Think you are having a reaction to a medicine you are taking. Have headaches that keep coming back (recurring). Feel dizzy. Have swelling in your ankles. Have trouble with your vision. Get help right away if you: Develop a severe headache or confusion. Have unusual weakness or numbness. Feel faint. Have severe pain in your chest or abdomen. Vomit repeatedly. Have trouble breathing. Summary Hypertension is when the force of blood pumping through your arteries is too strong. If this condition is not controlled, it may put you at risk for serious complications. Your personal target blood pressure may vary depending on your medical conditions, your age, and other factors. For most people, a normal blood pressure is less than 120/80. Hypertension is treated with lifestyle changes, medicines, or a combination of both. Lifestyle changes include losing weight, eating a healthy, low-sodium diet, exercising more, and limiting alcohol. This information is not intended to replace advice given to you by your health care provider. Make sure you discuss any questions you have with your health care provider. Document Revised: 12/31/2017 Document Reviewed: 12/31/2017 Elsevier Patient Education  2022 [employer].

## 2021-06-06 NOTE — Progress Notes (Signed)
Dale Mcintosh, is a 36 y.o. male  TOI:[id number]  IPJ:[id number]  DOB - [birth year]  Chief Complaint  Patient presents with   Hypertension       Subjective:   Dale Mcintosh is a 36 y.o. male here today for a follow up visit From ED visit 05/15/2021 for sore throat.  That has resolved.  BP was very high.  He had run out of meds.  He says even when he is taking all for meds that BP is still 150-160/80s-90s.  He denies HA/CP/SOB.  Admits to poor diet.  Smokes 2-3 cigs per day and one marijauana joint.  No exercise   From ED visit 05/15/2021: Viral pharyngitis Elevated blood pressure reading in office with diagnosis of hypertension   Rapid strep negative, sent for culture, discussed etiology of symptoms, timeline and possible resolution with patient, viscous lidocaine and ibuprofen 800 mg prescribed for outpatient management, may use salt water gargles, throat lozenges, warm teas and honey in addition, work note given   Blood pressure elevated in triage 209/129, patient endorses that he ran out of his medication 2 days ago and plans to pick up from pharmacy today, discussed that headache is most likely a result of elevated blood pressure reading,    Patient has No headache, No chest pain, No abdominal pain - No Nausea, No new weakness tingling or numbness, No Cough - SOB.  No problems updated.  ALLERGIES: Allergies  Allergen Reactions   Bee Venom Anaphylaxis   Shrimp [Shellfish Allergy] Itching    PAST MEDICAL HISTORY: Past Medical History:  Diagnosis Date   Hypertension    OSA (obstructive sleep apnea)     MEDICATIONS AT HOME: Prior to Admission medications   Medication Sig Start Date End Date Taking? Authorizing Provider  amLODipine (NORVASC) 10 MG tablet TAKE 1 TABLET (10 MG TOTAL) BY MOUTH DAILY. 06/06/21 06/06/22  Anders Simmonds, PA-C  bacitracin ointment Apply 1 application topically 2 (two) times daily. 02/28/21   Antony Madura, PA-C  carvedilol (COREG) 25 MG tablet  TAKE 1 TABLET (25 MG TOTAL) BY MOUTH 2 (TWO) TIMES DAILY WITH A MEAL. 06/06/21 06/06/22  Anders Simmonds, PA-C  cloNIDine (CATAPRES - DOSED IN MG/24 HR) 0.2 mg/24hr patch Place 1 patch (0.2 mg total) onto the skin once a week. 06/06/21   Anders Simmonds, PA-C  ibuprofen (ADVIL) 800 MG tablet Take 1 tablet (800 mg total) by mouth 3 (three) times daily. 05/15/21   White, Elita Boone, NP  lidocaine (XYLOCAINE) 2 % solution Use as directed 15 mLs in the mouth or throat as needed for mouth pain. 05/15/21   White, Elita Boone, NP  losartan-hydrochlorothiazide (HYZAAR) 100-25 MG tablet TAKE 1 TABLET BY MOUTH DAILY. 06/06/21 06/06/22  Anders Simmonds, PA-C  sildenafil (VIAGRA) 100 MG tablet Take 0.5-1 tablets (50-100 mg total) by mouth daily as needed for erectile dysfunction. 08/31/20   Claiborne Rigg, NP  hydrochlorothiazide (HYDRODIURIL) 25 MG tablet Take 1 tablet (25 mg total) by mouth daily. 06/27/20 07/21/20  Rema Fendt, NP  losartan (COZAAR) 100 MG tablet Take 1 tablet (100 mg total) by mouth daily. 06/27/20 07/21/20  Rema Fendt, NP  nitroGLYCERIN (NITROSTAT) 0.4 MG SL tablet Place 1 tablet (0.4 mg total) under the tongue every 5 (five) minutes as needed for chest pain (per CT heart protocol). Patient not taking: Reported on 11/02/2018 08/29/18 02/25/19  Glade Lloyd, MD    ROS: Neg HEENT Neg resp Neg cardiac Neg GI Neg GU  Neg MS Neg psych Neg neuro  Objective:   Vitals:   06/06/21 1119  BP: (!) 147/99  Pulse: 88  Resp: 16  SpO2: 97%  Weight: (!) 309 lb (140.2 kg)   Exam General appearance : Awake, alert, not in any distress. Speech Clear. Not toxic looking; overweight HEENT: Atraumatic and Normocephalic, Neck: Supple, no JVD. No cervical lymphadenopathy.  Chest: Good air entry bilaterally, CTAB.  No rales/rhonchi/wheezing CVS: S1 S2 regular, no murmurs.  Extremities: B/L Lower Ext shows no edema, both legs are warm to touch Neurology: Awake alert, and oriented X 3, CN II-XII  intact, Non focal Skin: No Rash  Data Review Lab Results  Component Value Date   HGBA1C 5.5 07/03/2020    Assessment & Plan   1. Hyperglycemia I have had a lengthy discussion and provided education about insulin resistance and the intake of too much sugar/refined carbohydrates.  I have advised the patient to work at a goal of eliminating sugary drinks, candy, desserts, sweets, refined sugars, processed foods, and white carbohydrates.  The patient expresses understanding.  - Hemoglobin A1c - Ambulatory referral to Cardiology  2. Encounter for examination following treatment at hospital Doing well  3. Essential hypertension Uncontrolled;refractive to treatment.  Will ask for cardiology input - Comprehensive metabolic panel - Hemoglobin A1c - Ambulatory referral to Cardiology  4. Smoker He has cut way back which is good.  Cessation encouraged Smoking and dangers of nicotine have been discussed at length. Long term health consequences of smoking reviewed in detail.  Methods for helping with cessation have been reviewed.  Patient expresses understanding.   5. Screening cholesterol level - Lipid panel - Ambulatory referral to Cardiology    Patient have been counseled extensively about nutrition and exercise. Other issues discussed during this visit include: low cholesterol diet, weight control and daily exercise, foot care, annual eye examinations at Ophthalmology, importance of adherence with medications and regular follow-up. We also discussed long term complications of uncontrolled diabetes and hypertension.   Return in about 3 months (around 09/03/2021) for PCP-chronic conditions.  The patient was given clear instructions to go to ER or return to medical center if symptoms don't improve, worsen or new problems develop. The patient verbalized understanding. The patient was told to call to get lab results if they haven't heard anything in the next week.      Georgian Co,  PA-C [facility] [place], [place] [phone]   06/06/2021, 11:34 AM

## 2021-06-07 ENCOUNTER — Other Ambulatory Visit: Payer: Self-pay

## 2021-06-07 ENCOUNTER — Other Ambulatory Visit: Payer: Self-pay | Admitting: Physician Assistant

## 2021-06-07 DIAGNOSIS — R7303 Prediabetes: Secondary | ICD-10-CM

## 2021-06-07 LAB — LIPID PANEL
Chol/HDL Ratio: 4.2 ratio (ref 0.0–5.0)
Cholesterol, Total: 195 mg/dL (ref 100–199)
HDL: 46 mg/dL (ref >39)
LDL Chol Calc (NIH): 125 mg/dL — ABNORMAL HIGH (ref 0–99)
Triglycerides: 134 mg/dL (ref 0–149)
VLDL Cholesterol Cal: 24 mg/dL (ref 5–40)

## 2021-06-07 LAB — COMPREHENSIVE METABOLIC PANEL
ALT: 20 IU/L (ref 0–44)
AST: 19 IU/L (ref 0–40)
Albumin/Globulin Ratio: 1.3 (ref 1.2–2.2)
Albumin: 4.6 g/dL (ref 4.0–5.0)
Alkaline Phosphatase: 70 IU/L (ref 44–121)
BUN/Creatinine Ratio: 15 (ref 9–20)
BUN: 21 mg/dL — ABNORMAL HIGH (ref 6–20)
Bilirubin Total: 0.2 mg/dL (ref 0.0–1.2)
CO2: 28 mmol/L (ref 20–29)
Calcium: 9.7 mg/dL (ref 8.7–10.2)
Chloride: 103 mmol/L (ref 96–106)
Creatinine, Ser: 1.41 mg/dL — ABNORMAL HIGH (ref 0.76–1.27)
Globulin, Total: 3.5 g/dL (ref 1.5–4.5)
Glucose: 106 mg/dL — ABNORMAL HIGH (ref 70–99)
Potassium: 4 mmol/L (ref 3.5–5.2)
Sodium: 146 mmol/L — ABNORMAL HIGH (ref 134–144)
Total Protein: 8.1 g/dL (ref 6.0–8.5)
eGFR: 67 mL/min/{1.73_m2} (ref >59)

## 2021-06-07 LAB — HEMOGLOBIN A1C
Est. average glucose Bld gHb Est-mCnc: 126 mg/dL
Hgb A1c MFr Bld: 6 % — ABNORMAL HIGH (ref 4.8–5.6)

## 2021-06-07 MED ORDER — METFORMIN HCL 500 MG PO TABS
500.0000 mg | ORAL_TABLET | Freq: Two times a day (BID) | ORAL | 3 refills | Status: DC
  Filled 2021-06-07: qty 60, 30d supply, fill #0
  Filled 2021-06-20: qty 180, 90d supply, fill #0
  Filled 2021-11-28: qty 60, 30d supply, fill #1
  Filled 2022-01-17: qty 60, 30d supply, fill #2
  Filled 2022-02-25 – 2022-03-04 (×4): qty 60, 30d supply, fill #3
  Filled 2022-04-18: qty 60, 30d supply, fill #4
  Filled 2022-05-20 (×2): qty 60, 30d supply, fill #5

## 2021-06-14 ENCOUNTER — Other Ambulatory Visit: Payer: Self-pay

## 2021-06-20 ENCOUNTER — Encounter: Payer: Self-pay | Admitting: Nurse Practitioner

## 2021-06-20 ENCOUNTER — Other Ambulatory Visit: Payer: Self-pay

## 2021-06-20 NOTE — Telephone Encounter (Signed)
ERROR

## 2021-06-20 NOTE — Progress Notes (Signed)
Cardiology Office Note:    Date:  06/21/2021   ID:  Dale Mcintosh, DOB 13-Aug-1985, MRN [id number]  PCP:  Dale Rigg, NP [facility] Cardiologist: Rollene Rotunda, MD   Reason for visit: Follow-up  History of Present Illness:    Dale Mcintosh is a 36 y.o. 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  He last had office visit with Dr. Antoine Mcintosh in May 2020.    [facility].  Is having follow-up on high blood pressure.  Patient mid to poor diet.  He smoked 2 to 3 cigarettes/day and 1 marijuana joint.  No regular exercise.  He currently does not have insurance and therefore has not been able to have a sleep study for suspected sleep apnea.  Today, he feels okay.  He states he was referred back to [place] for refractory hypertension.  He states this current medication regimen for about a year.  He denies heart failure symptoms including PND, orthopnea lower extremity edema.  He admits to dietary indiscretion as he works at [employer].  He states he is applied for [place] card.  He denies headache and vision changes at this time.  He states he was diagnosed with prediabetes and was just started on metformin.   Past Medical History:  Diagnosis Date   Hypertension    OSA (obstructive sleep apnea)     Past Surgical History:  Procedure Laterality Date   NO PAST SURGERIES     None      Current Medications: Current Meds  Medication Sig   amLODipine (NORVASC) 10 MG tablet TAKE 1 TABLET (10 MG TOTAL) BY MOUTH DAILY.   bacitracin ointment Apply 1 application topically 2 (two) times daily.   carvedilol (COREG) 25 MG tablet TAKE 1 TABLET (25 MG TOTAL) BY MOUTH 2 (TWO) TIMES DAILY WITH A MEAL.   chlorthalidone (HYGROTON) 25 MG tablet Take 1 tablet (25 mg total) by mouth daily.   cloNIDine (CATAPRES - DOSED IN MG/24 HR)  0.2 mg/24hr patch Place 1 patch (0.2 mg total) onto the skin once a week.   ibuprofen (ADVIL) 800 MG tablet Take 1 tablet (800 mg total) by mouth 3 (three) times daily.   lidocaine (XYLOCAINE) 2 % solution Use as directed 15 mLs in the mouth or throat as needed for mouth pain.   metFORMIN (GLUCOPHAGE) 500 MG tablet Take 1 tablet (500 mg total) by mouth 2 (two) times daily with a meal.   sildenafil (VIAGRA) 100 MG tablet Take 0.5-1 tablets (50-100 mg total) by mouth daily as needed for erectile dysfunction.   valsartan (DIOVAN) 160 MG tablet Take 1 tablet (160 mg total) by mouth daily.   [DISCONTINUED] losartan-hydrochlorothiazide (HYZAAR) 100-25 MG tablet TAKE 1 TABLET BY MOUTH DAILY.     Allergies:   Bee venom and Shrimp [shellfish allergy]   Social History   Socioeconomic History   Marital status: Significant Other    Spouse name: Not on file   Number of children: Not on file   Years of education: Not on file   Highest education level: Not on file  Occupational History   Not on file  Tobacco Use   Smoking status: Former    Packs/day: 0.50    Types: Cigarettes   Smokeless tobacco: Current  Vaping Use   Vaping Use: Never used  Substance and  Sexual Activity   Alcohol use: Yes    Comment: occ   Drug use: Yes    Types: Marijuana    Comment: daily   Sexual activity: Yes  Other Topics Concern   Not on file  Social History Narrative   Not on file   Social Determinants of Health   Financial Resource Strain: Not on file  Food Insecurity: Not on file  Transportation Needs: Not on file  Physical Activity: Not on file  Stress: Not on file  Social Connections: Not on file     Family History: The patient's family history includes Diabetes in his father; Hypertension in his father and mother.  ROS:   Please see the history of present illness.     EKGs/Labs/Other Studies Reviewed:    EKG:  The ekg ordered today demonstrates normal sinus rhythm, heart rate 66, PR interval  164 ms, QRS duration 106 ms.  Recent Labs: 10/05/2020: Hemoglobin 14.0; Platelets 203 06/06/2021: ALT 20; BUN 21; Creatinine, Ser 1.41; Potassium 4.0; Sodium 146   Recent Lipid Panel Lab Results  Component Value Date/Time   CHOL 195 06/06/2021 11:39 AM   TRIG 134 06/06/2021 11:39 AM   HDL 46 06/06/2021 11:39 AM   LDLCALC 125 (H) 06/06/2021 11:39 AM    Physical Exam:    VS:  BP (!) 176/100    Pulse 66    Ht 5\' 11"  (1.803 m)    Wt (!) 313 lb (142 kg)    SpO2 96%    BMI 43.65 kg/m     Repeat BP 176/100 No data found.  Wt Readings from Last 3 Encounters:  06/21/21 (!) 313 lb (142 kg)  06/06/21 (!) 309 lb (140.2 kg)  10/04/20 296 lb (134.3 kg)     GEN:  Well nourished, well developed in no acute distress, obese HEENT: Normal NECK: No JVD; No carotid bruits CARDIAC: RRR, no murmurs, rubs, gallops RESPIRATORY:  Clear to auscultation without rales, wheezing or rhonchi  ABDOMEN: Soft, non-tender, non-distended MUSCULOSKELETAL: No edema; No deformity  SKIN: Warm and dry NEUROLOGIC:  Alert and oriented PSYCHIATRIC:  Normal affect     ASSESSMENT AND PLAN   Hypertension, BP elevated -As he has chronic hypertension and he is not having any headache or vision changes, do not think he requires hospitalization.   -Stop Hyzaar. -Change HCTZ to chlorthalidone. -Change Lostartan to Valsartan.  Apply for patient assistance for Entresto 49/51 mg twice daily. -B/l Cr ~1.4.  Check BMET in 1 week. -Goal BP is <130/80.  Recommend DASH diet (high in vegetables, fruits, low-fat dairy products, whole grains, poultry, fish, and nuts and low in sweets, sugar-sweetened beverages, and red meats), salt restriction and increase physical activity.  Recommend tobacco cessation. -Recommend sleep study when he has insurance. -We discussed that he is high risk for stroke, intracranial hemorrhage and heart failure. -EF was noted to be 40 to 45% on echo in 2020.  With no heart failure symptoms & no insurance,  will defer on repeating echocardiogram at this time. -We will have him follow-up with our pharmacist for further blood pressure management.    Hyperlipidemia -Total cholesterol 195, HDL 46, triglycerides 134 and LDL 125 on June 06, 2021. -With normal coronaries on CTA, okay to defer on statin therapy at this time.  Recommend lifestyle changes including weight loss and increasing exercise (he has a membership at June 08, 2021 but no car/transportation). -Discussed cholesterol lowering diets - Mediterranean diet, DASH diet, vegetarian diet, low-carbohydrate diet and avoidance of  trans fats.  Discussed healthier choice substitutes.  Nuts, high-fiber foods, and fiber supplements may also improve lipids.    Obesity -Discussed how even a 5-10% weight loss can have cardiovascular benefits.   -Recommend moderate intensity activity for 30 minutes 5 days/week and the DASH diet.  Tobacco use  -Recommend tobacco cessation.  Reviewed physiologic effects of nicotine and the immediate-eventual benefits of quitting including improvement in cough/breathing and reduction in cardiovascular events.  Discussed quitting tips such as removing triggers and getting support from family/friends and Quitline [place].   Disposition - Follow-up in 4 weeks with our pharmacist for refractory hypertension management.        Medication Adjustments/Labs and Tests Ordered: Current medicines are reviewed at length with the patient today.  Concerns regarding medicines are outlined above.  Orders Placed This Encounter  Procedures   Basic metabolic panel   EKG 12-Lead   Meds ordered this encounter  Medications   chlorthalidone (HYGROTON) 25 MG tablet    Sig: Take 1 tablet (25 mg total) by mouth daily.    Dispense:  30 tablet    Refill:  3   valsartan (DIOVAN) 160 MG tablet    Sig: Take 1 tablet (160 mg total) by mouth daily.    Dispense:  60 tablet    Refill:  3    Patient Instructions  Medication Instructions:  Stop  Hyzaar. Start Valsartan 160 mg ( 1 Tablet Twice Daily). *If you need a refill on your cardiac medications before your next appointment, please call your pharmacy*   Lab Work: BMET 1 week If you have labs (blood work) drawn today and your tests are completely normal, you will receive your results only by: MyChart Message (if you have MyChart) OR A paper copy in the mail If you have any lab test that is abnormal or we need to change your treatment, we will call you to review the results.   Testing/Procedures: No Testing   Follow-Up: At [facility], you and your health needs are our priority.  As part of our continuing mission to provide you with exceptional heart care, we have created designated Provider Care Teams.  These Care Teams include your primary Cardiologist (physician) and Advanced Practice Providers (APPs -  Physician Assistants and Nurse Practitioners) who all work together to provide you with the care you need, when you need it.  We recommend signing up for the patient portal called "MyChart".  Sign up information is provided on this After Visit Summary.  MyChart is used to connect with patients for Virtual Visits (Telemedicine).  Patients are able to view lab/test results, encounter notes, upcoming appointments, etc.  Non-urgent messages can be sent to your provider as well.   To learn more about what you can do with MyChart, go to [email or web address].    Your next appointment:   4 week(s)  The format for your next appointment:   In Person  Provider:   Juanda Crumble, PA-C    Then, Rollene Rotunda, MD will plan to see you again in 3 month(s).    Other Instructions Take Blood Daily 1 Hour after Taking Medication.    Signed, Cannon Kettle, PA-C  06/21/2021 10:34 AM    [facility]

## 2021-06-21 ENCOUNTER — Other Ambulatory Visit: Payer: Self-pay

## 2021-06-21 ENCOUNTER — Ambulatory Visit (INDEPENDENT_AMBULATORY_CARE_PROVIDER_SITE_OTHER): Payer: Self-pay | Admitting: Physician Assistant

## 2021-06-21 ENCOUNTER — Encounter: Payer: Self-pay | Admitting: Physician Assistant

## 2021-06-21 VITALS — BP 176/100 | HR 66 | Ht 71.0 in | Wt 313.0 lb

## 2021-06-21 DIAGNOSIS — E785 Hyperlipidemia, unspecified: Secondary | ICD-10-CM

## 2021-06-21 DIAGNOSIS — Z720 Tobacco use: Secondary | ICD-10-CM

## 2021-06-21 DIAGNOSIS — I10 Essential (primary) hypertension: Secondary | ICD-10-CM

## 2021-06-21 MED ORDER — VALSARTAN 160 MG PO TABS
160.0000 mg | ORAL_TABLET | Freq: Every day | ORAL | 3 refills | Status: DC
  Filled 2021-06-21: qty 30, 30d supply, fill #0
  Filled 2021-07-30: qty 30, 30d supply, fill #1
  Filled 2021-08-31: qty 30, 30d supply, fill #2
  Filled 2021-10-19: qty 30, 30d supply, fill #3
  Filled 2021-11-28: qty 30, 30d supply, fill #4
  Filled 2022-01-17: qty 30, 30d supply, fill #5
  Filled 2022-02-25 – 2022-03-04 (×2): qty 30, 30d supply, fill #6
  Filled 2022-04-18: qty 30, 30d supply, fill #7

## 2021-06-21 MED ORDER — CHLORTHALIDONE 25 MG PO TABS
25.0000 mg | ORAL_TABLET | Freq: Every day | ORAL | 3 refills | Status: DC
  Filled 2021-06-21: qty 30, 30d supply, fill #0
  Filled 2021-07-30: qty 30, 30d supply, fill #1
  Filled 2021-08-31: qty 30, 30d supply, fill #2
  Filled 2021-10-19: qty 30, 30d supply, fill #3

## 2021-06-21 NOTE — Patient Instructions (Addendum)
Medication Instructions:  Stop Hyzaar. Start Valsartan 160 mg ( 1 Tablet Twice Daily). Start chlorthalidone 25 mg (1 Tablet Daily). *If you need a refill on your cardiac medications before your next appointment, please call your pharmacy*   Lab Work: BMET 1 week If you have labs (blood work) drawn today and your tests are completely normal, you will receive your results only by: MyChart Message (if you have MyChart) OR A paper copy in the mail If you have any lab test that is abnormal or we need to change your treatment, we will call you to review the results.   Testing/Procedures: No Testing   Follow-Up: At [facility], you and your health needs are our priority.  As part of our continuing mission to provide you with exceptional heart care, we have created designated Provider Care Teams.  These Care Teams include your primary Cardiologist (physician) and Advanced Practice Providers (APPs -  Physician Assistants and Nurse Practitioners) who all work together to provide you with the care you need, when you need it.  We recommend signing up for the patient portal called "MyChart".  Sign up information is provided on this After Visit Summary.  MyChart is used to connect with patients for Virtual Visits (Telemedicine).  Patients are able to view lab/test results, encounter notes, upcoming appointments, etc.  Non-urgent messages can be sent to your provider as well.   To learn more about what you can do with MyChart, go to [email or web address].    Your next appointment:   4 week(s)  The format for your next appointment:   In Person  Provider:   Juanda Crumble, PA-C    Then, Rollene Rotunda, MD will plan to see you again in 3 month(s).    Other Instructions Take Blood Daily 1 Hour after Taking Medication.

## 2021-06-21 NOTE — Progress Notes (Signed)
Heart and Vascular Care Navigation  06/21/2021  Dale Mcintosh 12/13/1985 [id number]  Reason for Referral:  LCSW noted pt uninsured.  Engaged with patient face to face for initial visit for Heart and Vascular Care Coordination.                                                                                                   Assessment:      LCSW met with pt at conclusion of his appointment here at [facility]. Introduced self, role, reason for call. Confirmed home address, PCP, and emergency contact. Pt shares that he applied for [facility] Card last week but hasnt heard anything back yet. Pt did not apply through [employer] Public house manager through PCP). Pt does not think he applied for CAFA, does have outstanding bills which could potentially qualify. I provided him with my card, application for [facility] Financial Assistance and encouraged him to let me know if he has any issues with applying. He was also given a new [employer] Card application in case he has issues with the one already submitted. LCSW inquired about medications, housing etc. No additional concerns stated at this time.                                   HRT/VAS Care Coordination     Patients Home Cardiology Office [place] Team Social Worker   Social Worker Name: Valeda Malm, [place] [phone]   Living arrangements for the past 2 months Single Family Home   Lives with: Significant Other; Minor Children   Patient Current Insurance Coverage Self-Pay   Patient Has Concern With Paying Medical Bills Yes   Patient Concerns With Medical Bills recent ED visits, ongoing medical care   Medical Bill Referrals: provided CAFA, has submitted [place] Card per his report   Does Patient Have Prescription Coverage? No   Patient Prescription Assistance Programs Patient Assistance Programs  pt utilizes [place], recommended he reach out to [place] if any questions/concern arise   Home Assistive Devices/Equipment  None       Social History:                                                                             SDOH Screenings   Alcohol Screen: Not on file  Depression (PHQ2-9): Low Risk    PHQ-2 Score: 0  Financial Resource Strain: Low Risk    Difficulty of Paying Living Expenses: Not very hard  Food Insecurity: No Food Insecurity   Worried About Charity fundraiser in the Last Year: Never true   Ran Out of Food in the Last Year: Never true  Housing: Low Risk    Last Housing Risk Score: 0  Physical Activity:  Not on file  Social Connections: Not on file  Stress: Not on file  Tobacco Use: High Risk   Smoking Tobacco Use: Former   Smokeless Tobacco Use: Current   Passive Exposure: Not on file  Transportation Needs: No Transportation Needs   Lack of Transportation (Medical): No   Lack of Transportation (Non-Medical): No    SDOH Interventions: Financial Resources:  Sales promotion account executive Interventions: Development worker, community, Other (Comment) (referral for CAFA and [employer]) Occupational hygienist for [employer] Insecurity:  Food Insecurity Interventions: Intervention Not Indicated  Housing Insecurity:  Housing Interventions: Intervention Not Indicated  Transportation:   Transportation Interventions: Intervention Not Indicated    Other Care Navigation Interventions:     Provided Pharmacy assistance resources Patient Assistance Programs (pt utilizes [place], recommended he reach out to [place] if any questions/concern arise)- given Entresto App   Follow-up plan:   LCSW has provided pt with above applications, my contact information and I remain available as needed moving forward. CMA has given pt application for [facility] and he was encouraged to complete that.

## 2021-07-10 ENCOUNTER — Telehealth: Payer: Self-pay | Admitting: Licensed Clinical Social Worker

## 2021-07-10 NOTE — Telephone Encounter (Signed)
LCSW reached out to pt today regarding assistance applications provided to pt. Pt available at [phone], re-introduced self, role, reason for call. Pt shares that he still hasn't heard anything from [facility] and hasn't had any time to f/u with the other applications due to work.  ?Provided support for managing multiple concerns. Encouraged him to let me know if I could be of any assistance w/ completing these applications. I will reach out to [facility]) program and see what their response is, also will text pt (with his permission given) the [facility] number. Pt in agreement, I remain available. ? ?I was able to receive f/u back from [facility] with [facility]- they are not able to locate any submissions from pt, she encouraged pt to reach out to them. I texted pt that update and their number, he will call them. I will f/u with pt in a few weeks if no contact before that time.  ? ?Westley Hummer, MSW, LCSW ?Clinical Social Worker II ?[place] [facility] Navigation  ?[phone]- work cell phone (preferred) ?[phone]- desk phone ? ?

## 2021-07-18 NOTE — Progress Notes (Deleted)
?Cardiology Office Note:   ? ?Date:  07/18/2021  ? ?ID:  Dale Mcintosh, DOB [birth year], MRN [id number] ? ?PCP:  Dale Rigg, NP ?[facility] Cardiologist: Dale Rotunda, MD  ? ?Reason for visit: 4-week follow-up ? ?History of Present Illness:   ? ?Dale Mcintosh is a 36 y.o. 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 ?  ?He last saw me on June 21, 2021 for treatment of refractory hypertension.  We stopped his Hyzaar.  Started him on chlorthalidone and valsartan.  He is applying for health coverage, until then we are deferring on Entresto and a sleep study.  He admited to dietary indiscretion as he works at [employer].   ?BMET ? ?Hypertension, BP elevated ?-*** ?-Sleep study when health coverage obtained. ?-BMET today. ? ?Apply for patient assistance for Entresto 49/51 mg twice daily. ?-B/l Cr ~1.4.  Check BMET in 1 week. ? ?Hyperlipidemia ?-Total cholesterol 195, HDL 46, triglycerides 134 and LDL 125 on June 06, 2021. ?-With normal coronaries on CTA, defer on statin therapy at this time.  Recommend weight loss and increasing exercise (he has a membership at [employer] but no car/transportation).. ?  ?Obesity ?-Recommend moderate intensity activity for 30 minutes 5 days/week and the DASH diet. ?  ?Tobacco use  ?-Recommend tobacco cessation.   ?  ?Disposition - Follow-up in ***. ?  ? ?  ?Past Medical History:  ?Diagnosis Date  ? Hypertension   ? OSA (obstructive sleep apnea)   ? ? ?Past Surgical History:  ?Procedure Laterality Date  ? NO PAST SURGERIES    ? None    ? ? ?Current Medications: ?No outpatient medications have been marked as taking for the 07/19/21 encounter (Appointment) with Dale Kettle, PA-C.  ?  ? ?Allergies:   Bee venom and Shrimp [shellfish allergy]  ? ?Social History  ? ?Socioeconomic History  ? Marital status: Significant Other  ?  Spouse name: Not on file  ? Number of  children: Not on file  ? Years of education: Not on file  ? Highest education level: Not on file  ?Occupational History  ? Not on file  ?Tobacco Use  ? Smoking status: Former  ?  Packs/day: 0.50  ?  Types: Cigarettes  ? Smokeless tobacco: Current  ?Vaping Use  ? Vaping Use: Never used  ?Substance and Sexual Activity  ? Alcohol use: Yes  ?  Comment: occ  ? Drug use: Yes  ?  Types: Marijuana  ?  Comment: daily  ? Sexual activity: Yes  ?Other Topics Concern  ? Not on file  ?Social History Narrative  ? Not on file  ? ?Social Determinants of Health  ? ?Financial Resource Strain: Low Risk   ? Difficulty of Paying Living Expenses: Not very hard  ?Food Insecurity: No Food Insecurity  ? Worried About Programme researcher, broadcasting/film/video in the Last Year: Never true  ? Ran Out of Food in the Last Year: Never true  ?Transportation Needs: No Transportation Needs  ? Lack of Transportation (Medical): No  ? Lack of Transportation (Non-Medical): No  ?Physical Activity: Not on file  ?Stress: Not on file  ?Social Connections: Not on file  ?  ? ?Family History: ?The patient's family history includes Diabetes in his father; Hypertension in his father and mother. ? ?ROS:   ?Please see the history of present illness.    ? ?EKGs/Labs/Other Studies Reviewed:   ? ?  EKG:  The ekg ordered today demonstrates *** ? ?Recent Labs: ?10/05/2020: Hemoglobin 14.0; Platelets 203 ?06/06/2021: ALT 20; BUN 21; Creatinine, Ser 1.41; Potassium 4.0; Sodium 146  ? ?Recent Lipid Panel ?Lab Results  ?Component Value Date/Time  ? CHOL 195 06/06/2021 11:39 AM  ? TRIG 134 06/06/2021 11:39 AM  ? HDL 46 06/06/2021 11:39 AM  ? LDLCALC 125 (H) 06/06/2021 11:39 AM  ? ? ?Physical Exam:   ? ?VS:  There were no vitals taken for this visit.   ?No data found. ? ?Wt Readings from Last 3 Encounters:  ?06/21/21 (!) 313 lb (142 kg)  ?06/06/21 (!) 309 lb (140.2 kg)  ?10/04/20 296 lb (134.3 kg)  ?  ? ?GEN: *** Well nourished, well developed in no acute distress ?HEENT: Normal ?NECK: No JVD; No  carotid bruits ?CARDIAC: ***RRR, no murmurs, rubs, gallops ?RESPIRATORY:  Clear to auscultation without rales, wheezing or rhonchi  ?ABDOMEN: Soft, non-tender, non-distended ?MUSCULOSKELETAL: No edema; No deformity  ?SKIN: Warm and dry ?NEUROLOGIC:  Alert and oriented ?PSYCHIATRIC:  Normal affect  ? ?  ?ASSESSMENT AND PLAN  ? ?*** ? ? ?{Are you ordering a CV Procedure (e.g. stress test, cath, DCCV, TEE, etc)?   Press F2        :[id number]}  ? ? ?Medication Adjustments/Labs and Tests Ordered: ?Current medicines are reviewed at length with the patient today.  Concerns regarding medicines are outlined above.  ?No orders of the defined types were placed in this encounter. ? ?No orders of the defined types were placed in this encounter. ? ? ?There are no Patient Instructions on file for this visit.  ? ?Signed, ?Dale Kettle, PA-C  ?07/18/2021 2:10 PM    ?[facility] ?

## 2021-07-19 ENCOUNTER — Ambulatory Visit: Payer: Self-pay | Admitting: Physician Assistant

## 2021-07-19 ENCOUNTER — Telehealth: Payer: Self-pay | Admitting: Licensed Clinical Social Worker

## 2021-07-19 DIAGNOSIS — I10 Essential (primary) hypertension: Secondary | ICD-10-CM

## 2021-07-19 NOTE — Telephone Encounter (Signed)
Received the following text message from pt "good morning, I had to be there today but I think I went to the wrong building and I could not find the papers you gave me so you think you can email them to me please." ? ?LCSW responded with the following- ?"Good morning, your appointment was at [facility], over [employer], if you ended up somewhere else our schedulers at [phone] can get you rescheduled. Where did you go? I can email blank copies, what is the best email for me to get them to you. The [employer] application will be a link. But before your resubmit that one I'd definitely recommend you call them [phone] if you haven't yet." ? ?At this time no answer from pt. I have advised Colletta Maryland, CMA w/ Caron Presume today of the above.  ? ?Westley Hummer, MSW, LCSW ?Clinical Social Worker II ?[place] [facility]  ?[phone]- work cell phone (preferred) ?[phone]- desk phone ? ?

## 2021-07-23 NOTE — Telephone Encounter (Signed)
F/u message sent to pt regarding applications which were emailed to his requested email [email or web address]- want to ensure that he has received these.  ? ?Octavio Graves, MSW, LCSW ?Clinical Social Worker II ?[place] [facility]  ?[phone]- work cell phone (preferred) ?[phone]- desk phone ? ?

## 2021-07-24 NOTE — Telephone Encounter (Signed)
Pt sent me a message stating he had not yet checked his email but will today. I remain available.  ? ?Octavio Graves, MSW, LCSW ?Clinical Social Worker II ?[place] [facility]  ?[phone]- work cell phone (preferred) ?[phone]- desk phone ? ?

## 2021-07-26 ENCOUNTER — Encounter: Payer: Self-pay | Admitting: Physician Assistant

## 2021-07-27 ENCOUNTER — Telehealth: Payer: Self-pay | Admitting: Licensed Clinical Social Worker

## 2021-07-27 NOTE — Telephone Encounter (Signed)
LCSW received text message on work cell from pt stating he still hasn't received e-mail w/ assistance applications. I again emailed them to [email or web address] and left them at front desk per his request. I remain available.  ? ?Octavio Graves, MSW, LCSW ?Clinical Social Worker II ?[place] [facility]  ?[phone]- work cell phone (preferred) ?[phone]- desk phone ? ?

## 2021-07-28 NOTE — Progress Notes (Signed)
?Cardiology Office Note:   ? ?Date:  08/01/2021  ? ?ID:  Dale Mcintosh, DOB 1985-09-09, MRN [id number] ? ?PCP:  Gildardo Pounds, NP  ?Cardiologist:  Minus Breeding, MD  ?Electrophysiologist:  None  ? ?Referring MD: Gildardo Pounds, NP  ? ?Chief Complaint: follow-up of hypertension ? ?History of Present Illness:   ? ?Dale Mcintosh is a 36 y.o. 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 Percival Spanish and presents today for follow-up of hypertension. ? ?Patient was first seen by Dr. Debara Pickett in 08/2016 for further evaluation of malignant hypertension but was lost to follow-up after this.  He was not seen again until admission in 08/2018 for chest pain in setting of hypertensive emergency.  Echo showed LVEF of 40-45% with global hypokinesis, moderate LVH, and grade 2 diastolic dysfunction.  He underwent coronary CTA at that time which showed a coronary calcium score of 0 with no significant CAD.  Therefore, cardiomyopathy felt to be due to uncontrolled hypertension. He was suspected to have sleep apnea but could not afford a sleep study. He was seen for health follow-up once in 09/2018 following this admission and then was again lost to follow-up.  ? ?Patient was last seen by Caron Presume, PA-C, on 06/21/2021 again for poorly controlled hypertension after not being seen in almost 3 years.  He denied any real cardiac symptoms at this time but admitted to a poor diet and no regular exercise.  He also reported tobacco abuse and marijuana use.  His current antihypertensive regimen included Amlodipine 10 mg daily, Coreg 25 mg daily, Losartan-HCTZ 100-83m daily, and Clonidine patch 0.2 mg weekly.  BP was 176/100 at that visit.  His Losartan-HCTZ was stopped and he was started on  Valsartan 160 mg daily and Chlorthalidone 25 mg daily [facility].  Patient reported not having insurance and financial difficulties at that visit.  Therefore, he also met with our social worker IWestley Hummerfor assistance. ? ?Patient presents today for follow-up.  Here alone.  Patient is doing well today.  His BP is still mildly elevated at 138/98 but much better than where it was at his last visit.  He has been keeping a BP log at home.  He forgot to bring this in today but states his BP is usually at goal of less than 130/80.  He is also down 14 lbs since his last visit and states he has been going to the gym every day and watching his diet.  He denies any cardiac symptoms.  No chest pain, shortness of breath, orthopnea, PND, lower extremity edema, palpitations, lightheadedness, dizziness, syncope.  He is compliant with all his medications. ? ?He does continue to smoke but he is trying to quit. He states a pack of cigarettes will typically last him about 3 days. ? ?Past Medical History:  ?Diagnosis Date  ? Hypertension   ? OSA (obstructive sleep apnea)   ? ? ?Past Surgical History:  ?Procedure Laterality Date  ? NO PAST SURGERIES    ? None    ? ? ?Current Medications: ?Current Meds  ?Medication Sig  ? amLODipine (NORVASC) 10 MG tablet TAKE 1 TABLET (10 MG TOTAL) BY MOUTH DAILY.  ? bacitracin ointment Apply 1 application topically  2 (two) times daily.  ? carvedilol (COREG) 25 MG tablet TAKE 1 TABLET (25 MG TOTAL) BY MOUTH 2 (TWO) TIMES DAILY WITH A MEAL.  ? chlorthalidone (HYGROTON) 25 MG tablet Take 1 tablet (25 mg total) by mouth daily.  ? cloNIDine (CATAPRES - DOSED IN MG/24 HR) 0.2 mg/24hr patch Place 1 patch (0.2 mg total) onto the skin once a week.  ? lidocaine (XYLOCAINE) 2 % solution Use as directed 15 mLs in the mouth or throat as needed for mouth pain.  ? metFORMIN (GLUCOPHAGE) 500 MG tablet Take 1 tablet (500 mg total) by mouth 2 (two) times daily  with a meal.  ? sildenafil (VIAGRA) 100 MG tablet Take 0.5-1 tablets (50-100 mg total) by mouth daily as needed for erectile dysfunction.  ? valsartan (DIOVAN) 160 MG tablet Take 1 tablet (160 mg total) by mouth daily.  ? [DISCONTINUED] hydrochlorothiazide (HYDRODIURIL) 25 MG tablet Take 1 tablet (25 mg total) by mouth daily.  ? [DISCONTINUED] ibuprofen (ADVIL) 800 MG tablet Take 1 tablet (800 mg total) by mouth 3 (three) times daily.  ?  ? ?Allergies:   Bee venom and Shrimp [shellfish allergy]  ? ?Social History  ? ?Socioeconomic History  ? Marital status: Significant Other  ?  Spouse name: Not on file  ? Number of children: Not on file  ? Years of education: Not on file  ? Highest education level: Not on file  ?Occupational History  ? Not on file  ?Tobacco Use  ? Smoking status: Former  ?  Packs/day: 0.50  ?  Types: Cigarettes  ? Smokeless tobacco: Current  ?Vaping Use  ? Vaping Use: Never used  ?Substance and Sexual Activity  ? Alcohol use: Yes  ?  Comment: occ  ? Drug use: Yes  ?  Types: Marijuana  ?  Comment: daily  ? Sexual activity: Yes  ?Other Topics Concern  ? Not on file  ?Social History Narrative  ? Not on file  ? ?Social Determinants of Health  ? ?Financial Resource Strain: Low Risk   ? Difficulty of Paying Living Expenses: Not very hard  ?Food Insecurity: No Food Insecurity  ? Worried About Charity fundraiser in the Last Year: Never true  ? Ran Out of Food in the Last Year: Never true  ?Transportation Needs: No Transportation Needs  ? Lack of Transportation (Medical): No  ? Lack of Transportation (Non-Medical): No  ?Physical Activity: Not on file  ?Stress: Not on file  ?Social Connections: Not on file  ?  ? ?Family History: ?The patient's family history includes Diabetes in his father; Hypertension in his father and mother. ? ?ROS:   ?Please see the history of present illness.    ? ?EKGs/Labs/Other Studies Reviewed:   ? ?The following studies were reviewed today: ? ?Echocardiogram  08/28/2018: ?Impressions: ? 1. The left ventricle has mild-moderately reduced systolic function, with  ?an ejection fraction of 40-45%. The cavity size was normal. There is  ?moderate concentric left ventricular hypertrophy. Left ventricular  ?diastolic Doppler parameters are consistent  ? with pseudonormalization. Left ventrical global hypokinesis without  ?regional wall motion abnormalities.  ? 2. The right ventricle has normal systolic function. The cavity was  ?normal. There is no increase in right ventricular wall thickness.  ? 3. Left atrial size was mildly dilated.  ? 4. Right atrial size was mildly dilated.  ? 5. The aortic valve is tricuspid. ?_______________ ? ?Coronary CTA 08/28/2018: ?Impression: ?1. Coronary artery calcium score 0 Agatston units, suggesting  low ?risk for future cardiac events. ?2.  No significant coronary artery disease noted. ? ?EKG:  EKG not ordered today.  ? ?Recent Labs: ?10/05/2020: Hemoglobin 14.0; Platelets 203 ?06/06/2021: ALT 20; BUN 21; Creatinine, Ser 1.41; Potassium 4.0; Sodium 146  ?Recent Lipid Panel ?   ?Component Value Date/Time  ? CHOL 195 06/06/2021 1139  ? TRIG 134 06/06/2021 1139  ? HDL 46 06/06/2021 1139  ? CHOLHDL 4.2 06/06/2021 1139  ? CHOLHDL 3.9 08/28/2018 0809  ? VLDL 17 08/28/2018 0809  ? LDLCALC 125 (H) 06/06/2021 1139  ? ? ?Physical Exam:   ? ?Vital Signs: BP (!) 138/98 (BP Location: Left Arm, Patient Position: Sitting, Cuff Size: Large)   Pulse 80   Ht '5\' 11"'  (1.803 m)   Wt 299 lb (135.6 kg)   BMI 41.70 kg/m?    ? ?Wt Readings from Last 3 Encounters:  ?08/01/21 299 lb (135.6 kg)  ?06/21/21 (!) 313 lb (142 kg)  ?06/06/21 (!) 309 lb (140.2 kg)  ?  ? ?General: 36 y.o. obese African-American male in no acute distress. ?HEENT: Normocephalic and atraumatic. Sclera clear.  ?Neck: Supple. No carotid bruits. No JVD. ?Heart: RRR. Distinct S1 and S2. No murmurs, gallops, or rubs. Radial and distal pedal pulses 2+ and equal bilaterally. ?Lungs: No increased work of  breathing. Clear to ausculation bilaterally.  ?Abdomen: Soft, non-distended, and non-tender to palpation.  ?Extremities: Trace lower extremity edema bilaterally.    ?Skin: Warm and dry. ?Neuro: Alert and oriented x3. No focal defi

## 2021-07-30 ENCOUNTER — Telehealth: Payer: Self-pay | Admitting: Licensed Clinical Social Worker

## 2021-07-30 ENCOUNTER — Other Ambulatory Visit: Payer: Self-pay

## 2021-07-30 NOTE — Telephone Encounter (Signed)
LCSW noted that assistance applications were still at the front desk. I have sent him a reminder that they are there as requested for pick up. I also let him know that he can collect them during his appointment on 3/29. I remain available.  ? ?Octavio Graves, MSW, LCSW ?Clinical Social Worker II ?[place] [facility]  ?[phone]- work cell phone (preferred) ?[phone]- desk phone ? ?

## 2021-07-31 ENCOUNTER — Other Ambulatory Visit: Payer: Self-pay

## 2021-07-31 NOTE — Telephone Encounter (Signed)
Pt responded back that he would come get documents today. ?Documents have still not been picked up this afternoon.  ?I remain available. Pt w/ appt at office tomorrow.   ? ?Octavio Graves, MSW, LCSW ?Clinical Social Worker II ?[place] [facility]  ?[phone]- work cell phone (preferred) ?[phone]- desk phone ? ?

## 2021-08-01 ENCOUNTER — Ambulatory Visit (INDEPENDENT_AMBULATORY_CARE_PROVIDER_SITE_OTHER): Payer: Self-pay | Admitting: Student

## 2021-08-01 ENCOUNTER — Encounter: Payer: Self-pay | Admitting: Student

## 2021-08-01 VITALS — BP 138/98 | HR 80 | Ht 71.0 in | Wt 299.0 lb

## 2021-08-01 DIAGNOSIS — Z720 Tobacco use: Secondary | ICD-10-CM

## 2021-08-01 DIAGNOSIS — I10 Essential (primary) hypertension: Secondary | ICD-10-CM

## 2021-08-01 DIAGNOSIS — R29818 Other symptoms and signs involving the nervous system: Secondary | ICD-10-CM

## 2021-08-01 DIAGNOSIS — I428 Other cardiomyopathies: Secondary | ICD-10-CM

## 2021-08-01 DIAGNOSIS — Z79899 Other long term (current) drug therapy: Secondary | ICD-10-CM

## 2021-08-01 DIAGNOSIS — E785 Hyperlipidemia, unspecified: Secondary | ICD-10-CM

## 2021-08-01 NOTE — Patient Instructions (Addendum)
Medication Instructions:  ? ? No changes  ?*If you need a refill on your cardiac medications before your next appointment, please call your pharmacy* ? ? ?Lab Work: ?BMP ?If you have labs (blood work) drawn today and your tests are completely normal, you will receive your results only by: ?MyChart Message (if you have MyChart) OR ?A paper copy in the mail ?If you have any lab test that is abnormal or we need to change your treatment, we will call you to review the results. ? ? ?Testing/Procedures: ? ?Not needed ? ?Follow-Up: ?At [facility], you and your health needs are our priority.  As part of our continuing mission to provide you with exceptional heart care, we have created designated Provider Care Teams.  These Care Teams include your primary Cardiologist (physician) and Advanced Practice Providers (APPs -  Physician Assistants and Nurse Practitioners) who all work together to provide you with the care you need, when you need it. ? ?  ? ?Your next appointment:   ?  keep your appt in May 2023 with Dr Antoine Poche  ? ?The format for your next appointment:   ?In Person ? ?Provider:   ?Rollene Rotunda, MD  ? ? ?Other Instructions  ? Send a copy of blood pressures log readings  through mychart ?

## 2021-08-02 ENCOUNTER — Telehealth: Payer: Self-pay

## 2021-08-02 ENCOUNTER — Telehealth: Payer: Self-pay | Admitting: Licensed Clinical Social Worker

## 2021-08-02 LAB — BASIC METABOLIC PANEL
BUN/Creatinine Ratio: 11 (ref 9–20)
BUN: 15 mg/dL (ref 6–20)
CO2: 23 mmol/L (ref 20–29)
Calcium: 9.8 mg/dL (ref 8.7–10.2)
Chloride: 103 mmol/L (ref 96–106)
Creatinine, Ser: 1.39 mg/dL — ABNORMAL HIGH (ref 0.76–1.27)
Glucose: 94 mg/dL (ref 70–99)
Potassium: 4.1 mmol/L (ref 3.5–5.2)
Sodium: 146 mmol/L — ABNORMAL HIGH (ref 134–144)
eGFR: 68 mL/min/{1.73_m2} (ref >59)

## 2021-08-02 NOTE — Telephone Encounter (Addendum)
Called patient regarding results. Patient had understanding of results.----- Message from Cannon Kettle, PA-C sent at 08/02/2021 12:53 PM EDT ----- ?Kidney function stable.  Electrolytes normal. ?

## 2021-08-02 NOTE — Telephone Encounter (Signed)
Pt left paperwork for this writer which had been left for him to complete. No supporting documents have been gathered and applications remain incomplete in places indicated for pt to complete. I have reached out to him to inquire whether he would like to pick them up to complete them or have them mailed to him. Incomplete documents include [employer], [employer], and [employer] PAP. ? ?Dale Mcintosh, MSW, LCSW ?Clinical Social Worker II ?[place] [facility]  ?[phone]- work cell phone (preferred) ?[phone]- desk phone ? ?

## 2021-08-03 NOTE — Telephone Encounter (Signed)
Pt returned my text message after business hours on 3/30. ?Stated there were some items on there that he did not understand and that he could pick it up in the morning. I responded this morning that pt could pick them up after 9am, I am available to assist with the applications if I know when he is coming otherwise could leave them at front desk if he is not interested in support. As of 11:15am on 3/31 I have not heard which is pt preference.  ? ?Octavio Graves, MSW, LCSW ?Clinical Social Worker II ?[place] [facility]  ?[phone]- work cell phone (preferred) ?[phone]- desk phone ? ?

## 2021-08-06 ENCOUNTER — Telehealth: Payer: Self-pay | Admitting: Licensed Clinical Social Worker

## 2021-08-06 NOTE — Telephone Encounter (Signed)
Pt did not respond to my messages on Friday 3/31, nor did he come by the office.  ?He walked into [facility] office to collect papers this afternoon 4/3. I spoke with front desk representative and shared that I offered pt opportunity to meet with me to go over the parts he did not understand but had not heard anything back. I requested pt contact me with a good time to review if he would like further support with completion. Front Production assistant, radio returned his paperwork for [employer] PAP, [employer] and [employer]. I remain available.  ? ?Dale Mcintosh, MSW, LCSW ?Clinical Social Worker II ?[place] [facility]  ?[phone]- work cell phone (preferred) ?[phone]- desk phone ? ?

## 2021-08-15 ENCOUNTER — Telehealth: Payer: Self-pay | Admitting: Licensed Clinical Social Worker

## 2021-08-15 NOTE — Telephone Encounter (Signed)
LCSW called pt today at [phone]. Was able to reach pt, inquired about patient assistance applications. Pt shared that they are still in his car from picking them up last week. Pt shared again there were things he didn't understand. I shared that I have mailed highlights and placed written instructions on paperwork. When he gets them from his car he can call and schedule a time to work with me, or he can call me and we can try and complete them telephonically together. Pt in agreement, will try and call me later.  ? ?Octavio Graves, MSW, LCSW ?Clinical Social Worker II ?[place] [facility]  ?[phone]- work cell phone (preferred) ?[phone]- desk phone ? ?

## 2021-08-22 ENCOUNTER — Telehealth: Payer: Self-pay | Admitting: Licensed Clinical Social Worker

## 2021-08-22 NOTE — Telephone Encounter (Signed)
LCSW reached out via text message to pt today- as he never called me back after call last week for further assistance with [employer] and [employer]. ?Reminded him I remain available for assistance.  ? ?Octavio Graves, MSW, LCSW ?Clinical Social Worker II ?[place] [facility]  ?[phone]- work cell phone (preferred) ?[phone]- desk phone ? ?

## 2021-08-24 ENCOUNTER — Telehealth: Payer: Self-pay | Admitting: Licensed Clinical Social Worker

## 2021-08-24 NOTE — Telephone Encounter (Signed)
Pt has read my message sent on 4/19, did not respond. ?Have had only intermittent engagement w/ pt, he has been encouraged to f/u with me if he has questions/wants guidance with applications. I remain available, will not actively follow pt.  ? ?Octavio Graves, MSW, LCSW ?Clinical Social Worker II ?[place] [facility]  ?[phone]- work cell phone (preferred) ?[phone]- desk phone ? ?

## 2021-08-31 ENCOUNTER — Other Ambulatory Visit: Payer: Self-pay

## 2021-09-03 ENCOUNTER — Other Ambulatory Visit: Payer: Self-pay

## 2021-09-03 ENCOUNTER — Ambulatory Visit: Payer: Self-pay | Admitting: Nurse Practitioner

## 2021-09-09 NOTE — Progress Notes (Signed)
?  ?Cardiology Office Note ? ? ?Date:  09/10/2021  ? ?ID:  Dale Mcintosh, DOB 1985-11-17, MRN [id number] ? ?PCP:  Claiborne Rigg, NP  ?Cardiologist:   Rollene Rotunda, MD ? ? ?Chief Complaint  ?Patient presents with  ? Cardiomyopathy  ? ? ?  ?History of Present Illness: ?Dale Mcintosh is a 36 y.o. male who presents for evaluation of hypertensive urgency.   He was admitted for this with acute HF with mildly reduced EF.  Coronaries were normal on CTA.   He has had multiple office visits for med titration.   He is unable to afford Entresto.    ? ?He is working on his paperwork for [employer] assistance.  He has been getting his meds out-of-pocket however and taking them routinely.  He works at a [employer].  He works some long hours. The patient denies any new symptoms such as chest discomfort, neck or arm discomfort. There has been no new shortness of breath, PND or orthopnea. There have been no reported palpitations, presyncope or syncope.   ? ? ?Past Medical History:  ?Diagnosis Date  ? Hypertension   ? OSA (obstructive sleep apnea)   ? ? ?Past Surgical History:  ?Procedure Laterality Date  ? NO PAST SURGERIES    ? None    ? ? ? ?Current Outpatient Medications  ?Medication Sig Dispense Refill  ? amLODipine (NORVASC) 10 MG tablet TAKE 1 TABLET (10 MG TOTAL) BY MOUTH DAILY. 30 tablet 4  ? bacitracin ointment Apply 1 application topically 2 (two) times daily. 425 g 0  ? carvedilol (COREG) 25 MG tablet TAKE 1 TABLET (25 MG TOTAL) BY MOUTH 2 (TWO) TIMES DAILY WITH A MEAL. 60 tablet 4  ? chlorthalidone (HYGROTON) 25 MG tablet Take 1 tablet (25 mg total) by mouth daily. 30 tablet 3  ? cloNIDine (CATAPRES - DOSED IN MG/24 HR) 0.2 mg/24hr patch Place 1 patch (0.2 mg total) onto the skin once a week. 4 patch 4  ? hydrALAZINE (APRESOLINE) 10 MG tablet Take 1 tablet by mouth up to 3 times a day as needed for systolic blood pressure greater than 180 90 tablet 0  ? lidocaine (XYLOCAINE) 2 % solution Use as directed 15 mLs in the  mouth or throat as needed for mouth pain. 100 mL 0  ? metFORMIN (GLUCOPHAGE) 500 MG tablet Take 1 tablet (500 mg total) by mouth 2 (two) times daily with a meal. 180 tablet 3  ? sildenafil (VIAGRA) 100 MG tablet Take 0.5-1 tablets (50-100 mg total) by mouth daily as needed for erectile dysfunction. 10 tablet 6  ? valsartan (DIOVAN) 160 MG tablet Take 1 tablet (160 mg total) by mouth daily. 60 tablet 3  ? ?No current facility-administered medications for this visit.  ? ? ?Allergies:   Bee venom and Shrimp [shellfish allergy]  ? ? ?ROS:  Please see the history of present illness.   Otherwise, review of systems are positive for none.   All other systems are reviewed and negative.  ? ? ?PHYSICAL EXAM: ?VS:  BP 136/82   Pulse 84   Ht 5\' 11"  (1.803 m)   Wt 296 lb 9.6 oz (134.5 kg)   SpO2 97%   BMI 41.37 kg/m?  , BMI Body mass index is 41.37 kg/m?. ?GENERAL:  Well appearing ?NECK:  No jugular venous distention, waveform within normal limits, carotid upstroke brisk and symmetric, no bruits, no thyromegaly ?LYMPHATICS:  No cervical, inguinal adenopathy ?LUNGS:  Clear to auscultation bilaterally ?BACK:  No CVA tenderness ?CHEST:  Unremarkable ?HEART:  PMI not displaced or sustained,S1 and S2 within normal limits, no S3, no S4, no clicks, no rubs, no murmurs ?ABD:  Flat, positive bowel sounds normal in frequency in pitch, no bruits, no rebound, no guarding, no midline pulsatile mass, no hepatomegaly, no splenomegaly ? ? ?EKG:  EKG is not ordered today. ? ? ? ?Recent Labs: ?10/05/2020: Hemoglobin 14.0; Platelets 203 ?06/06/2021: ALT 20 ?08/01/2021: BUN 15; Creatinine, Ser 1.39; Potassium 4.1; Sodium 146  ? ? ?Lipid Panel ?   ?Component Value Date/Time  ? CHOL 195 06/06/2021 1139  ? TRIG 134 06/06/2021 1139  ? HDL 46 06/06/2021 1139  ? CHOLHDL 4.2 06/06/2021 1139  ? CHOLHDL 3.9 08/28/2018 0809  ? VLDL 17 08/28/2018 0809  ? LDLCALC 125 (H) 06/06/2021 1139  ? ?  ? ?Wt Readings from Last 3 Encounters:  ?09/10/21 296 lb 9.6 oz  (134.5 kg)  ?08/01/21 299 lb (135.6 kg)  ?06/21/21 (!) 313 lb (142 kg)  ?  ? ? ?Other studies Reviewed: ?Additional studies/ records that were reviewed today include: Labs. ?Review of the above records demonstrates:  Please see elsewhere in the note.   ? ? ?ASSESSMENT AND PLAN: ? ?Hypertension:   His blood pressure is much better controlled.  He is compliant with his medications.  Because of his previous hypertensive urgency I am going to give him hydralazine 10 mg with instructions to take as needed for systolics greater than 180.  ? ?Non ischemic cardiomyopathy: He will continue the meds as listed.  We can follow-up with an echocardiogram in the future.  For now no change in his therapy. ? ?Sleep apnea: He is going to let us know when he has his paperwork done so that we can order the sleep study. ? ?Tobacco abuse: He understands the need to stop smoking. ? ? ?Current medicines are reviewed at length with the patient today.  The patient does not have concerns regarding medicines. ? ?The following changes have been made:  no change ? ?Labs/ tests ordered today include: None ?No orders of the defined types were placed in this encounter. ? ? ? ?Disposition:   FU with me APP in six months.  ? ? ?Signed, ?Rollene Rotunda, MD  ?09/10/2021 11:39 AM    ?[facility] ? ? ? ?

## 2021-09-10 ENCOUNTER — Other Ambulatory Visit: Payer: Self-pay

## 2021-09-10 ENCOUNTER — Encounter: Payer: Self-pay | Admitting: Cardiology

## 2021-09-10 ENCOUNTER — Ambulatory Visit (INDEPENDENT_AMBULATORY_CARE_PROVIDER_SITE_OTHER): Payer: Self-pay | Admitting: Cardiology

## 2021-09-10 VITALS — BP 136/82 | HR 84 | Ht 71.0 in | Wt 296.6 lb

## 2021-09-10 DIAGNOSIS — I428 Other cardiomyopathies: Secondary | ICD-10-CM

## 2021-09-10 DIAGNOSIS — I160 Hypertensive urgency: Secondary | ICD-10-CM

## 2021-09-10 MED ORDER — HYDRALAZINE HCL 10 MG PO TABS
ORAL_TABLET | ORAL | 0 refills | Status: DC
  Filled 2021-09-10: qty 90, 30d supply, fill #0

## 2021-09-10 NOTE — Patient Instructions (Signed)
Medication Instructions:  ?START Hydralazine 10 mg 3 times a day as needed to Systolic (Top number) of blood pressure greater than 180 ?*If you need a refill on your cardiac medications before your next appointment, please call your pharmacy* ? ?Lab Work: ?NONE ordered at this time of appointment  ? ?If you have labs (blood work) drawn today and your tests are completely normal, you will receive your results only by: ?MyChart Message (if you have MyChart) OR ?A paper copy in the mail ?If you have any lab test that is abnormal or we need to change your treatment, we will call you to review the results. ? ?Testing/Procedures: ?NONE ordered at this time of appointment  ? ?Follow-Up: ?At [facility], you and your health needs are our priority.  As part of our continuing mission to provide you with exceptional heart care, we have created designated Provider Care Teams.  These Care Teams include your primary Cardiologist (physician) and Advanced Practice Providers (APPs -  Physician Assistants and Nurse Practitioners) who all work together to provide you with the care you need, when you need it. ? ? ?Your next appointment:   ?3 month(s) ? ?The format for your next appointment:   ?In Person ? ?Provider:   ?Marjie Skiff, PA-C      ? ? ?Other Instructions ? ? ?Important Information About Sugar ? ? ? ? ? ? ?

## 2021-09-17 ENCOUNTER — Other Ambulatory Visit: Payer: Self-pay

## 2021-10-19 ENCOUNTER — Other Ambulatory Visit: Payer: Self-pay

## 2021-10-22 ENCOUNTER — Other Ambulatory Visit: Payer: Self-pay

## 2021-10-29 ENCOUNTER — Other Ambulatory Visit: Payer: Self-pay

## 2021-11-16 ENCOUNTER — Other Ambulatory Visit: Payer: Self-pay

## 2021-11-16 ENCOUNTER — Emergency Department (HOSPITAL_COMMUNITY)
Admission: EM | Admit: 2021-11-16 | Discharge: 2021-11-16 | Disposition: A | Discharge status: Home / Self Care | Payer: Self-pay | Attending: Emergency Medicine | Admitting: Emergency Medicine

## 2021-11-16 ENCOUNTER — Emergency Department (HOSPITAL_COMMUNITY): Payer: Self-pay

## 2021-11-16 ENCOUNTER — Encounter (HOSPITAL_COMMUNITY): Payer: Self-pay

## 2021-11-16 ENCOUNTER — Encounter: Payer: Self-pay | Admitting: Cardiology

## 2021-11-16 DIAGNOSIS — I10 Essential (primary) hypertension: Secondary | ICD-10-CM | POA: Insufficient documentation

## 2021-11-16 DIAGNOSIS — E876 Hypokalemia: Secondary | ICD-10-CM | POA: Insufficient documentation

## 2021-11-16 DIAGNOSIS — Z23 Encounter for immunization: Secondary | ICD-10-CM | POA: Insufficient documentation

## 2021-11-16 DIAGNOSIS — S71031A Puncture wound without foreign body, right hip, initial encounter: Secondary | ICD-10-CM | POA: Insufficient documentation

## 2021-11-16 DIAGNOSIS — R944 Abnormal results of kidney function studies: Secondary | ICD-10-CM | POA: Insufficient documentation

## 2021-11-16 DIAGNOSIS — E860 Dehydration: Secondary | ICD-10-CM | POA: Insufficient documentation

## 2021-11-16 DIAGNOSIS — K5790 Diverticulosis of intestine, part unspecified, without perforation or abscess without bleeding: Secondary | ICD-10-CM

## 2021-11-16 DIAGNOSIS — W3400XA Accidental discharge from unspecified firearms or gun, initial encounter: Secondary | ICD-10-CM | POA: Insufficient documentation

## 2021-11-16 DIAGNOSIS — K5730 Diverticulosis of large intestine without perforation or abscess without bleeding: Secondary | ICD-10-CM | POA: Insufficient documentation

## 2021-11-16 DIAGNOSIS — Z20822 Contact with and (suspected) exposure to covid-19: Secondary | ICD-10-CM | POA: Insufficient documentation

## 2021-11-16 LAB — I-STAT CHEM 8, ED
BUN: 18 mg/dL (ref 6–20)
Calcium, Ion: 1.13 mmol/L — ABNORMAL LOW (ref 1.15–1.40)
Chloride: 103 mmol/L (ref 98–111)
Creatinine, Ser: 1.9 mg/dL — ABNORMAL HIGH (ref 0.61–1.24)
Glucose, Bld: 98 mg/dL (ref 70–99)
HCT: 45 % (ref 39.0–52.0)
Hemoglobin: 15.3 g/dL (ref 13.0–17.0)
Potassium: 2.9 mmol/L — ABNORMAL LOW (ref 3.5–5.1)
Sodium: 142 mmol/L (ref 135–145)
TCO2: 21 mmol/L — ABNORMAL LOW (ref 22–32)

## 2021-11-16 LAB — CBC
HCT: 43.5 % (ref 39.0–52.0)
Hemoglobin: 14.2 g/dL (ref 13.0–17.0)
MCH: 28.1 pg (ref 26.0–34.0)
MCHC: 32.6 g/dL (ref 30.0–36.0)
MCV: 86 fL (ref 80.0–100.0)
Platelets: 221 10*3/uL (ref 150–400)
RBC: 5.06 MIL/uL (ref 4.22–5.81)
RDW: 14.8 % (ref 11.5–15.5)
WBC: 9 10*3/uL (ref 4.0–10.5)
nRBC: 0 % (ref 0.0–0.2)

## 2021-11-16 LAB — URINALYSIS, ROUTINE W REFLEX MICROSCOPIC
Bilirubin Urine: NEGATIVE
Glucose, UA: NEGATIVE mg/dL
Hgb urine dipstick: NEGATIVE
Ketones, ur: NEGATIVE mg/dL
Leukocytes,Ua: NEGATIVE
Nitrite: NEGATIVE
Protein, ur: NEGATIVE mg/dL
Specific Gravity, Urine: 1.04 — ABNORMAL HIGH (ref 1.005–1.030)
pH: 5 (ref 5.0–8.0)

## 2021-11-16 LAB — PROTIME-INR
INR: 1 (ref 0.8–1.2)
Prothrombin Time: 13.3 seconds (ref 11.4–15.2)

## 2021-11-16 LAB — COMPREHENSIVE METABOLIC PANEL
ALT: 22 U/L (ref 0–44)
AST: 28 U/L (ref 15–41)
Albumin: 3.9 g/dL (ref 3.5–5.0)
Alkaline Phosphatase: 53 U/L (ref 38–126)
Anion gap: 20 — ABNORMAL HIGH (ref 5–15)
BUN: 17 mg/dL (ref 6–20)
CO2: 20 mmol/L — ABNORMAL LOW (ref 22–32)
Calcium: 10 mg/dL (ref 8.9–10.3)
Chloride: 101 mmol/L (ref 98–111)
Creatinine, Ser: 2.01 mg/dL — ABNORMAL HIGH (ref 0.61–1.24)
GFR, Estimated: 44 mL/min — ABNORMAL LOW (ref >60)
Glucose, Bld: 100 mg/dL — ABNORMAL HIGH (ref 70–99)
Potassium: 3 mmol/L — ABNORMAL LOW (ref 3.5–5.1)
Sodium: 141 mmol/L (ref 135–145)
Total Bilirubin: 0.6 mg/dL (ref 0.3–1.2)
Total Protein: 7.3 g/dL (ref 6.5–8.1)

## 2021-11-16 LAB — SAMPLE TO BLOOD BANK

## 2021-11-16 LAB — LACTIC ACID, PLASMA
Lactic Acid, Venous: 7.1 mmol/L (ref 0.5–1.9)
Lactic Acid, Venous: 1.7 mmol/L (ref 0.5–1.9)

## 2021-11-16 LAB — RESP PANEL BY RT-PCR (FLU A&B, COVID) ARPGX2
Influenza A by PCR: NEGATIVE
Influenza B by PCR: NEGATIVE
SARS Coronavirus 2 by RT PCR: NEGATIVE

## 2021-11-16 LAB — ETHANOL: Alcohol, Ethyl (B): <10 mg/dL (ref <10)

## 2021-11-16 MED ORDER — LACTATED RINGERS IV BOLUS
1000.0000 mL | Freq: Once | INTRAVENOUS | Status: AC
  Administered 2021-11-16: 1000 mL via INTRAVENOUS

## 2021-11-16 MED ORDER — LACTATED RINGERS IV BOLUS: 2000.0000 mL | Freq: Once | INTRAVENOUS | Status: DC

## 2021-11-16 MED ORDER — TETANUS-DIPHTH-ACELL PERTUSSIS 5-2.5-18.5 LF-MCG/0.5 IM SUSY
0.5000 mL | PREFILLED_SYRINGE | Freq: Once | INTRAMUSCULAR | Status: AC
  Administered 2021-11-16: 0.5 mL via INTRAMUSCULAR
  Filled 2021-11-16: qty 0.5

## 2021-11-16 MED ORDER — FENTANYL CITRATE PF 50 MCG/ML IJ SOSY
50.0000 ug | PREFILLED_SYRINGE | Freq: Once | INTRAMUSCULAR | Status: AC
  Administered 2021-11-16: 50 ug via INTRAVENOUS
  Filled 2021-11-16: qty 1

## 2021-11-16 MED ORDER — IOHEXOL 350 MG/ML SOLN
100.0000 mL | Freq: Once | INTRAVENOUS | Status: AC | PRN
  Administered 2021-11-16: 100 mL via INTRAVENOUS

## 2021-11-16 MED ORDER — POTASSIUM CHLORIDE CRYS ER 20 MEQ PO TBCR
40.0000 meq | EXTENDED_RELEASE_TABLET | Freq: Once | ORAL | Status: AC
  Administered 2021-11-16: 40 meq via ORAL
  Filled 2021-11-16: qty 2

## 2021-11-16 NOTE — ED Notes (Signed)
Belongings labeled by this tech and placed in brown bags.

## 2021-11-16 NOTE — ED Notes (Signed)
Patient transported to CT 
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
## 2021-11-16 NOTE — Progress Notes (Signed)
Orthopedic Tech Progress Note Patient Details:  Dale Mcintosh 05/06/1875 [id number]  Patient ID: Serita Grammes Doe, male   DOB: 05/06/1875, 36 y.o.   MRN: [id number] I attended trauma page. Trinna Post 11/16/2021, 12:23 AM

## 2021-11-16 NOTE — ED Notes (Signed)
Patient was given paper scrubs to wear

## 2021-11-16 NOTE — ED Notes (Signed)
Lactic Acid 7.1 - Dr. Bebe Shaggy informed.
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
## 2021-11-16 NOTE — Progress Notes (Signed)
   11/16/21 0003  Clinical Encounter Type  Visited With Patient not available  Visit Type Trauma  Referral From Nurse  Consult/Referral To Chaplain   Chaplain Tery Sanfilippo responded to the page. The patient is being attended to by the medical team. There is no support person present. Chaplain remains available for follow up spiritual /emotional support as needed. This note was prepared by Deneen Harts, M.Div..  For questions please contact by phone [phone].

## 2021-11-16 NOTE — ED Notes (Signed)
Patient's (2) gold necklaces removed for CT, placed in pink denture cup and labeled at bedside.

## 2021-11-28 ENCOUNTER — Ambulatory Visit (HOSPITAL_COMMUNITY)
Admission: EM | Admit: 2021-11-28 | Discharge: 2021-11-28 | Disposition: A | Discharge status: Home / Self Care | Payer: Self-pay | Attending: Emergency Medicine | Admitting: Emergency Medicine

## 2021-11-28 ENCOUNTER — Other Ambulatory Visit: Payer: Self-pay | Admitting: Physician Assistant

## 2021-11-28 ENCOUNTER — Encounter (HOSPITAL_COMMUNITY): Payer: Self-pay

## 2021-11-28 ENCOUNTER — Other Ambulatory Visit: Payer: Self-pay

## 2021-11-28 DIAGNOSIS — S71031D Puncture wound without foreign body, right hip, subsequent encounter: Secondary | ICD-10-CM

## 2021-11-28 MED ORDER — DOXYCYCLINE HYCLATE 100 MG PO TABS
100.0000 mg | ORAL_TABLET | Freq: Two times a day (BID) | ORAL | 0 refills | Status: DC
  Filled 2021-11-28: qty 14, 7d supply, fill #0
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
## 2021-11-28 NOTE — Discharge Instructions (Addendum)
As you have begun to see green discharge we will provide bacterial coverage  Take doxycycline every morning and every evening for the next 7 days  Continue to cleanse daily with diluted soap and water, pat dry, apply Neosporin and cover with a nonstick bandage  If after completion of antibiotics you do not see any improvement or you have concerns of about the site healing you may call the wound center for further evaluation and management
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
## 2021-11-29 ENCOUNTER — Other Ambulatory Visit: Payer: Self-pay

## 2021-11-30 IMAGING — CT CT ABD-PELV W/ CM
2 of 4 series · 16 of 46 positions shown, 18 images · IV contrast (omnipaque)
Comparison: One-view abdomen on 05/17/2019

CLINICAL DATA: RIGHT LOWER QUADRANT suprapubic tenderness for 1
week. Pain with passing gas. Urinating with pain. Pain radiates into
the testicle. Symptoms since the weekend.

EXAM:
CT ABDOMEN AND PELVIS WITH CONTRAST
TECHNIQUE: Multidetector CT imaging of the abdomen and pelvis was performed
using the standard protocol following bolus administration of
intravenous contrast.
CONTRAST:  100mL OMNIPAQUE IOHEXOL 300 MG/ML  SOLN

[Series 3: a/p w/ 5mm · axial · 0.98mm/px · z∈[+816,+1331]mm · 13 of 113 slices shown, 15 images]
[im 5/113  soft-tissue]
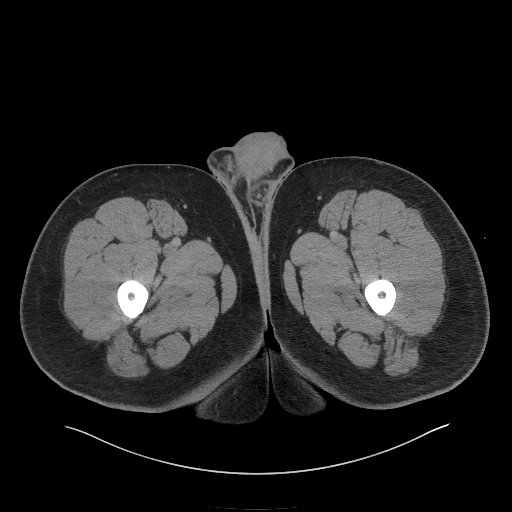
[im 5/113  bone]
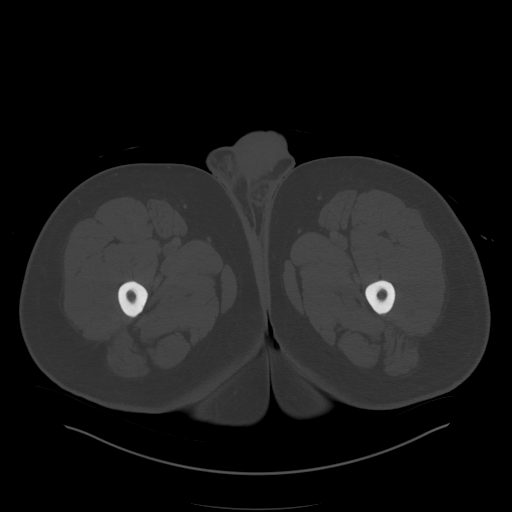
[im 15/113  soft-tissue]
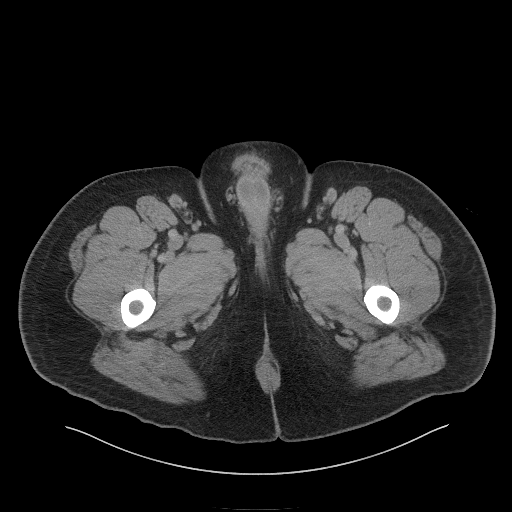
[im 24/113  soft-tissue]
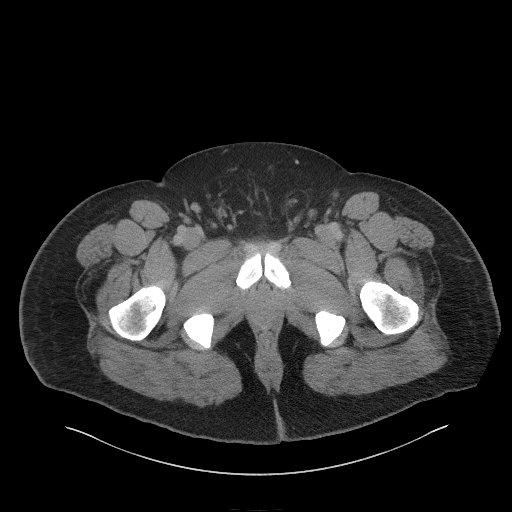
[im 33/113  soft-tissue]
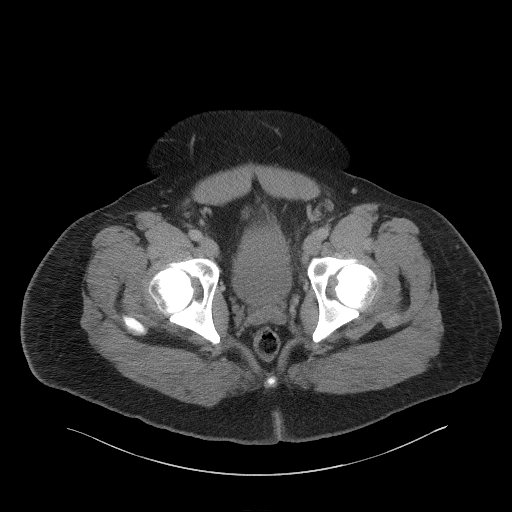
[im 38/113  soft-tissue]
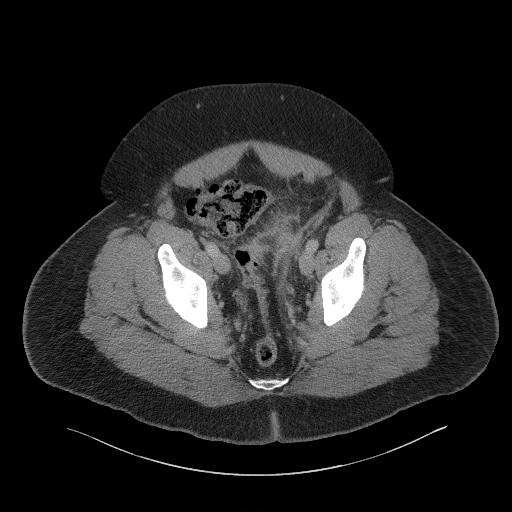
[im 47/113  soft-tissue]
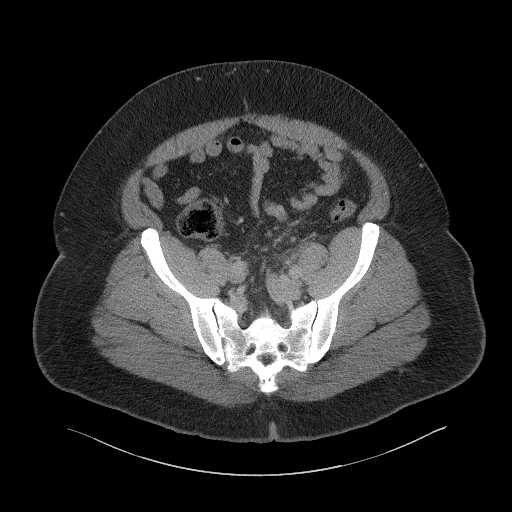
[im 57/113  soft-tissue]
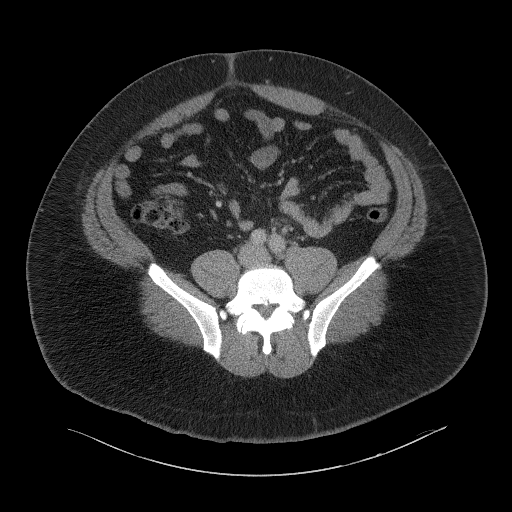
[im 66/113  soft-tissue]
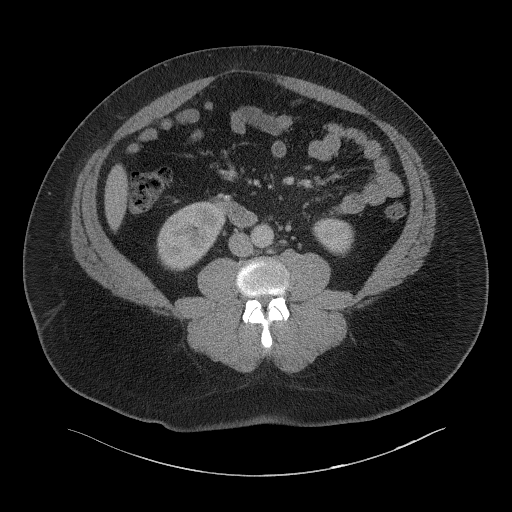
[im 75/113  soft-tissue]
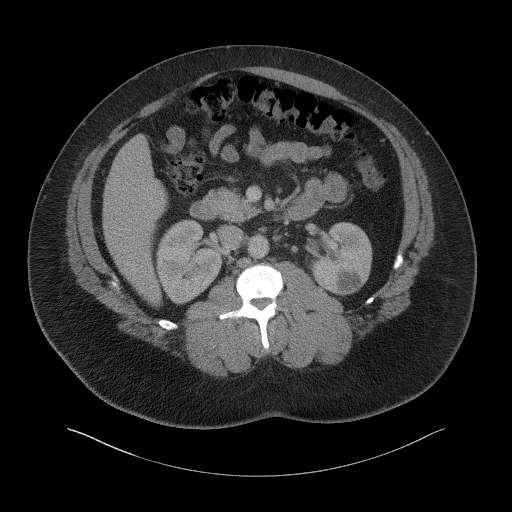
[im 75/113  bone]
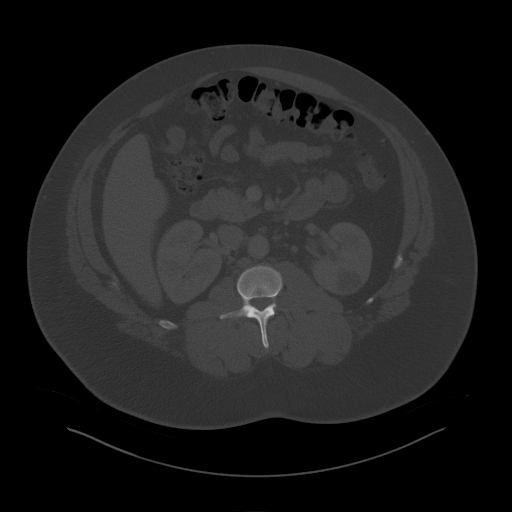
[im 80/113  soft-tissue]
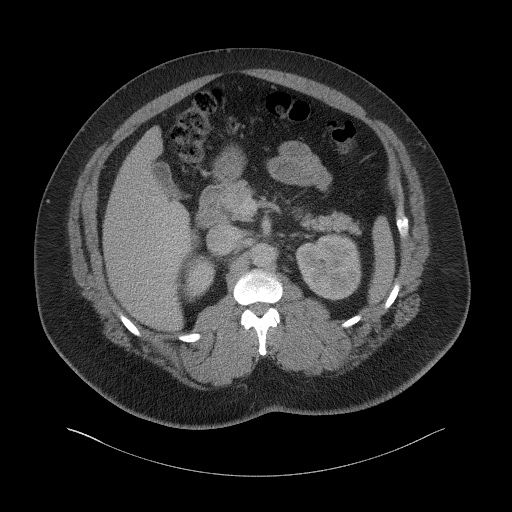
[im 89/113  soft-tissue]
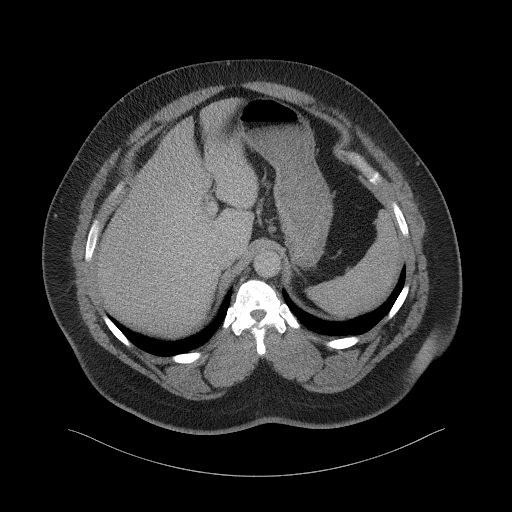
[im 99/113  soft-tissue]
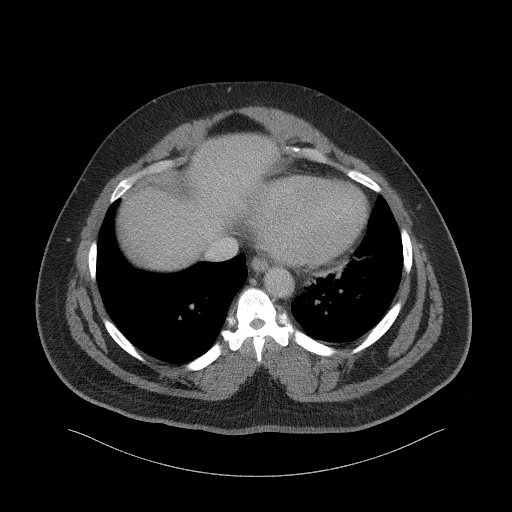
[im 108/113  soft-tissue]
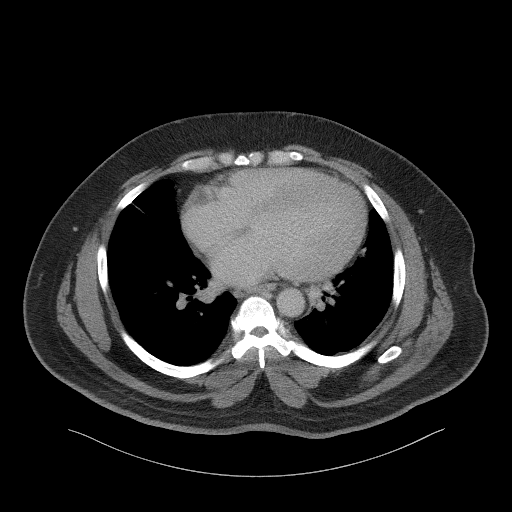

[Series 6: a/p w/ cor · coronal · 1.07mm/px · 3 of 217 slices shown]
[im 73/217  soft-tissue]
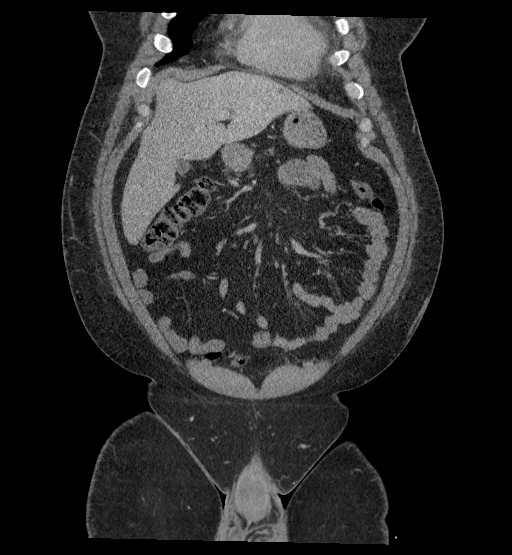
[im 97/217  soft-tissue]
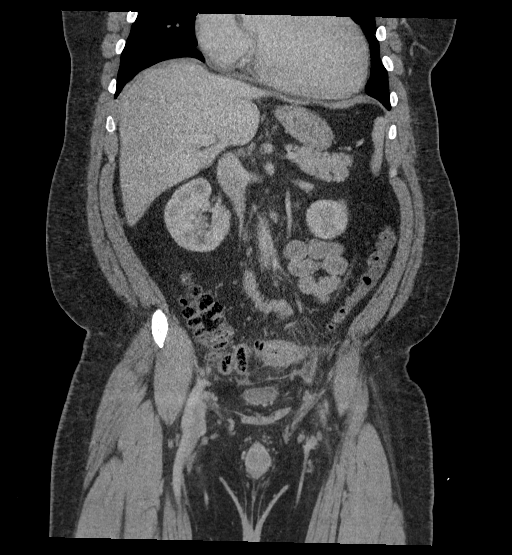
[im 121/217  soft-tissue]
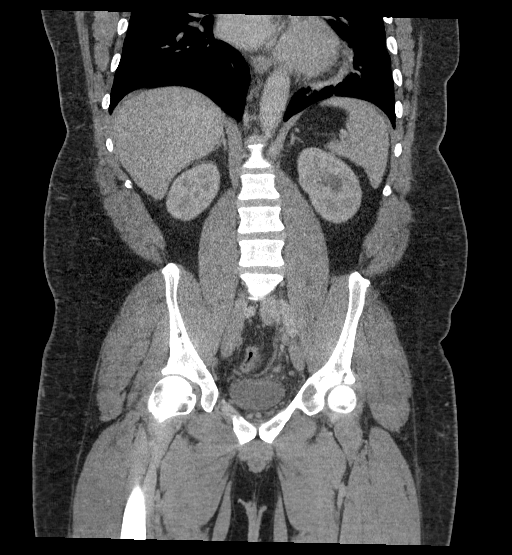

[16 of 46 positions shown; findings below may reference images not displayed]

FINDINGS: Lower chest: Heart is enlarged. Lung bases are unremarkable.

Hepatobiliary: Small low-attenuation probable cyst is identified in
the LEFT hepatic lobe measuring 1.1 centimeters. Gallbladder is
present and contracted.

Pancreas: Unremarkable. No pancreatic ductal dilatation or
surrounding inflammatory changes.

Spleen: Normal in size without focal abnormality.

Adrenals/Urinary Tract: Adrenal glands are normal in appearance. Ate
midpole cyst in the LEFT kidney is 2.3 centimeters. LOWER pole cyst
in the RIGHT kidney is 0.7 centimeters. There is no hydronephrosis.
Ureters are unremarkable. Bladder is decompressed and otherwise
normal in appearance.

Stomach/Bowel: Stomach is normal in appearance. Small bowel loops
are unremarkable. The appendix is well seen and has a normal
appearance.

There is significant inflammatory change in the sigmoid mesocolon.
The wall of the sigmoid colon is thickened and irregular. Scattered
diverticula are identified within this segment. No evidence for
perforation or abscess.

Vascular/Lymphatic: No significant vascular findings are present. No
enlarged abdominal or pelvic lymph nodes.

Reproductive: Prostate is unremarkable.

Other: No abdominal wall hernia or abnormality. No abdominopelvic
ascites.

Musculoskeletal: No acute or significant osseous findings.
IMPRESSION: 1. Findings are consistent with acute sigmoid diverticulitis. No
evidence for perforation or abscess.
2. Normal appendix.
3. Hepatic and renal cysts.
4. Cardiomegaly.
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
## 2021-12-05 ENCOUNTER — Other Ambulatory Visit: Payer: Self-pay

## 2021-12-06 ENCOUNTER — Other Ambulatory Visit: Payer: Self-pay

## 2021-12-11 ENCOUNTER — Encounter: Payer: Self-pay | Admitting: Student

## 2021-12-11 DIAGNOSIS — I10 Essential (primary) hypertension: Secondary | ICD-10-CM | POA: Insufficient documentation

## 2021-12-11 DIAGNOSIS — I428 Other cardiomyopathies: Secondary | ICD-10-CM | POA: Insufficient documentation

## 2021-12-12 ENCOUNTER — Ambulatory Visit: Payer: Self-pay | Admitting: Student

## 2021-12-12 DIAGNOSIS — I428 Other cardiomyopathies: Secondary | ICD-10-CM

## 2021-12-12 DIAGNOSIS — I10 Essential (primary) hypertension: Secondary | ICD-10-CM

## 2021-12-25 ENCOUNTER — Encounter: Payer: Self-pay | Admitting: Student

## 2022-01-17 ENCOUNTER — Other Ambulatory Visit: Payer: Self-pay | Admitting: Physician Assistant

## 2022-01-17 ENCOUNTER — Other Ambulatory Visit: Payer: Self-pay

## 2022-01-17 MED ORDER — AMLODIPINE BESYLATE 10 MG PO TABS
10.0000 mg | ORAL_TABLET | Freq: Every day | ORAL | 0 refills | Status: DC
  Filled 2022-01-17: qty 30, 30d supply, fill #0

## 2022-01-17 MED ORDER — CARVEDILOL 25 MG PO TABS
25.0000 mg | ORAL_TABLET | Freq: Two times a day (BID) | ORAL | 0 refills | Status: DC
  Filled 2022-01-17: qty 60, 30d supply, fill #0

## 2022-01-18 ENCOUNTER — Other Ambulatory Visit: Payer: Self-pay

## 2022-01-18 MED ORDER — CHLORTHALIDONE 25 MG PO TABS
25.0000 mg | ORAL_TABLET | Freq: Every day | ORAL | 8 refills | Status: DC
  Filled 2022-01-18: qty 30, 30d supply, fill #0
  Filled 2022-02-25 – 2022-03-04 (×2): qty 30, 30d supply, fill #1
  Filled 2022-04-18: qty 30, 30d supply, fill #2
  Filled 2022-05-20 – 2022-06-14 (×4): qty 30, 30d supply, fill #3
  Filled 2022-08-06: qty 30, 30d supply, fill #4
  Filled 2022-09-18 – 2022-10-29 (×4): qty 30, 30d supply, fill #5

## 2022-02-11 ENCOUNTER — Other Ambulatory Visit: Payer: Self-pay

## 2022-02-11 ENCOUNTER — Emergency Department (HOSPITAL_COMMUNITY): Payer: Self-pay

## 2022-02-11 ENCOUNTER — Encounter (HOSPITAL_COMMUNITY): Payer: Self-pay | Admitting: *Deleted

## 2022-02-11 ENCOUNTER — Emergency Department (HOSPITAL_COMMUNITY)
Admission: EM | Admit: 2022-02-11 | Discharge: 2022-02-11 | Disposition: A | Discharge status: Home / Self Care | Payer: Self-pay | Attending: Emergency Medicine | Admitting: Emergency Medicine

## 2022-02-11 DIAGNOSIS — W010XXA Fall on same level from slipping, tripping and stumbling without subsequent striking against object, initial encounter: Secondary | ICD-10-CM | POA: Insufficient documentation

## 2022-02-11 DIAGNOSIS — F17210 Nicotine dependence, cigarettes, uncomplicated: Secondary | ICD-10-CM | POA: Insufficient documentation

## 2022-02-11 DIAGNOSIS — M25522 Pain in left elbow: Secondary | ICD-10-CM | POA: Insufficient documentation

## 2022-02-11 DIAGNOSIS — R509 Fever, unspecified: Secondary | ICD-10-CM | POA: Insufficient documentation

## 2022-02-11 DIAGNOSIS — Z79899 Other long term (current) drug therapy: Secondary | ICD-10-CM | POA: Insufficient documentation

## 2022-02-11 DIAGNOSIS — Z7984 Long term (current) use of oral hypoglycemic drugs: Secondary | ICD-10-CM | POA: Insufficient documentation

## 2022-02-11 DIAGNOSIS — I10 Essential (primary) hypertension: Secondary | ICD-10-CM | POA: Insufficient documentation

## 2022-02-11 DIAGNOSIS — Y92009 Unspecified place in unspecified non-institutional (private) residence as the place of occurrence of the external cause: Secondary | ICD-10-CM | POA: Insufficient documentation

## 2022-02-11 DIAGNOSIS — W19XXXA Unspecified fall, initial encounter: Secondary | ICD-10-CM

## 2022-02-11 MED ORDER — NAPROXEN 250 MG PO TABS
500.0000 mg | ORAL_TABLET | Freq: Once | ORAL | Status: AC
  Administered 2022-02-11: 500 mg via ORAL
  Filled 2022-02-11: qty 2

## 2022-02-11 NOTE — ED Provider Notes (Signed)
[facility] EMERGENCY DEPARTMENT Provider Note  CSN: [id number] Arrival date & time: 02/11/22 0600  Chief Complaint(s) Fall  HPI Dale Mcintosh is a 36 y.o. male history of hypertension, hyperlipidemia, nonischemic cardiomyopathy presenting with left elbow pain.  He reports that he tripped this morning, landing on his left elbow.  Denies hitting his head or pain in any other part of his body.  Denies any neck pain.  Symptoms happened suddenly, today.   Past Medical History Past Medical History:  Diagnosis Date   Hyperlipidemia    Hypertension    Nonischemic cardiomyopathy ([place])    a. Echo 4/20220: LVEF of 40-45% (coronary CTA at the time showed coronary calcium score of 0 and normal coronaries) - felt to be due to uncontrolled hypertension   Obesity    OSA (obstructive sleep apnea)    Tobacco abuse    Patient Active Problem List   Diagnosis Date Noted   Nonischemic cardiomyopathy ([place])    Hypertension    Excessive daytime sleepiness 02/01/2017   Morbid obesity ([place]) 02/01/2017   Snoring 02/01/2017   Home Medication(s) Prior to Admission medications   Medication Sig Start Date End Date Taking? Authorizing Provider  amLODipine (NORVASC) 10 MG tablet Take 1 tablet (10 mg total) by mouth daily. 01/17/22   Charlott Rakes, MD  carvedilol (COREG) 25 MG tablet Take 1 tablet (25 mg total) by mouth 2 (two) times daily with a meal. 01/17/22   Charlott Rakes, MD  chlorthalidone (HYGROTON) 25 MG tablet Take 1 tablet (25 mg total) by mouth daily. 01/18/22 04/18/22  Warren Lacy, PA-C  cloNIDine (CATAPRES - DOSED IN MG/24 HR) 0.2 mg/24hr patch Place 1 patch (0.2 mg total) onto the skin once a week. 06/06/21   Argentina Donovan, PA-C  doxycycline (VIBRA-TABS) 100 MG tablet Take 1 tablet (100 mg total) by mouth 2 (two) times daily. 11/28/21   Hans Eden, NP  hydrALAZINE (APRESOLINE) 10 MG tablet Take 1 tablet by mouth up to 3 times a day as needed for systolic blood  pressure greater than 180 09/10/21   Minus Breeding, MD  lidocaine (XYLOCAINE) 2 % solution Use as directed 15 mLs in the mouth or throat as needed for mouth pain. 05/15/21   Hans Eden, NP  metFORMIN (GLUCOPHAGE) 500 MG tablet Take 1 tablet (500 mg total) by mouth 2 (two) times daily with a meal. 06/07/21   McClung, Dionne Bucy, PA-C  sildenafil (VIAGRA) 100 MG tablet Take 0.5-1 tablets (50-100 mg total) by mouth daily as needed for erectile dysfunction. 08/31/20   Gildardo Pounds, NP  valsartan (DIOVAN) 160 MG tablet Take 1 tablet (160 mg total) by mouth daily. 06/21/21   Warren Lacy, PA-C  hydrochlorothiazide (HYDRODIURIL) 25 MG tablet Take 1 tablet (25 mg total) by mouth daily. 06/27/20 07/21/20  Camillia Herter, NP  losartan (COZAAR) 100 MG tablet Take 1 tablet (100 mg total) by mouth daily. 06/27/20 07/21/20  Camillia Herter, NP  nitroGLYCERIN (NITROSTAT) 0.4 MG SL tablet Place 1 tablet (0.4 mg total) under the tongue every 5 (five) minutes as needed for chest pain (per CT heart protocol). Patient not taking: Reported on 11/02/2018 08/29/18 02/25/19  Aline August, MD  Past Surgical History Past Surgical History:  Procedure Laterality Date   NO PAST SURGERIES     None     Family History Family History  Problem Relation Age of Onset   Hypertension Mother    Hypertension Father    Diabetes Father     Social History Social History   Tobacco Use   Smoking status: Every Day    Types: Cigarettes   Smokeless tobacco: Current  Vaping Use   Vaping Use: Never used  Substance Use Topics   Alcohol use: Never    Comment: occ   Drug use: Yes    Types: Marijuana    Comment: daily   Allergies Bee venom, Bee venom, Shrimp (diagnostic), and Shrimp [shellfish allergy]  Review of Systems Review of Systems  All other systems reviewed and are  negative.   Physical Exam Vital Signs  I have reviewed the triage vital signs BP (!) 143/90 (BP Location: Right Arm)   Pulse 80   Temp 98.4 F (36.9 C) (Oral)   Resp 20   Ht 5\' 11"  (1.803 m)   Wt 136.1 kg   SpO2 100%   BMI 41.84 kg/m  Physical Exam Vitals and nursing note reviewed.  Constitutional:      General: He is not in acute distress.    Appearance: Normal appearance.  HENT:     Mouth/Throat:     Mouth: Mucous membranes are moist.  Eyes:     Conjunctiva/sclera: Conjunctivae normal.  Cardiovascular:     Rate and Rhythm: Normal rate and regular rhythm.  Pulmonary:     Effort: Pulmonary effort is normal. No respiratory distress.     Breath sounds: Normal breath sounds.  Abdominal:     General: Abdomen is flat.     Palpations: Abdomen is soft.     Tenderness: There is no abdominal tenderness.  Musculoskeletal:     Right lower leg: No edema.     Left lower leg: No edema.     Comments: Full range of motion of the left elbow without focal tenderness.  No tenderness to the left wrist or left shoulder 2+ radial pulse bilaterally.  Normal neurologic exam in the bilateral upper extremities  Skin:    General: Skin is warm and dry.     Capillary Refill: Capillary refill takes less than 2 seconds.  Neurological:     Mental Status: He is alert and oriented to person, place, and time. Mental status is at baseline.  Psychiatric:        Mood and Affect: Mood normal.        Behavior: Behavior normal.     ED Results and Treatments Labs (all labs ordered are listed, but only abnormal results are displayed) Labs Reviewed - No data to display                                                                                                                        Radiology DG Elbow Complete Left  Result Date: 02/11/2022 CLINICAL DATA:  Elbow pain after fall. EXAM: LEFT ELBOW - COMPLETE 3+ VIEW COMPARISON:  None Available. FINDINGS: There is no evidence of fracture, dislocation,  or joint effusion. IMPRESSION: Negative for fracture or subluxation. Electronically Signed   By: Jorje Guild M.D.   On: 02/11/2022 06:30    Pertinent labs & imaging results that were available during my care of the patient were reviewed by me and considered in my medical decision making (see MDM for details).  Medications Ordered in ED Medications  naproxen (NAPROSYN) tablet 500 mg (500 mg Oral Given 02/11/22 0616)                                                                                                                                     Procedures Procedures  (including critical care time)  Medical Decision Making / ED Course   MDM:  36 year old male presenting to the emergency department with left elbow pain.  On exam, patient has full range of motion of the left elbow.  He has no focal tenderness.  His x-ray is negative.  He has no sign of other injury from his fall.  He has normal vascular and neurologic exam. Will discharge patient to home. All questions answered. Patient comfortable with plan of discharge. Return precautions discussed with patient and specified on the after visit summary.       Additional history obtained: -External records from outside source obtained and reviewed including: Chart review including previous notes, labs, imaging, consultation notes   Lab Tests: -I ordered, reviewed, and interpreted labs.   The pertinent results include:   Labs Reviewed - No data to display    Imaging Studies ordered: I ordered imaging studies including XR left elbow On my interpretation imaging demonstrates no fracture I independently visualized and interpreted imaging. I agree with the radiologist interpretation   Medicines ordered and prescription drug management: Meds ordered this encounter  Medications   naproxen (NAPROSYN) tablet 500 mg    -I have reviewed the patients home medicines and have made adjustments as needed   Social Determinants of  Health:  Factors impacting patients care include: current smoker   Reevaluation: After the interventions noted above, I reevaluated the patient and found that they have resolved  Co morbidities that complicate the patient evaluation  Past Medical History:  Diagnosis Date   Hyperlipidemia    Hypertension    Nonischemic cardiomyopathy ([place])    a. Echo 4/20220: LVEF of 40-45% (coronary CTA at the time showed coronary calcium score of 0 and normal coronaries) - felt to be due to uncontrolled hypertension   Obesity    OSA (obstructive sleep apnea)    Tobacco abuse       Dispostion: Discharge    Final Clinical Impression(s) / ED Diagnoses Final diagnoses:  Fall in home, initial encounter  Left elbow pain     This chart was dictated using  voice recognition software.  Despite best efforts to proofread,  errors can occur which can change the documentation meaning.    Cristie Hem, MD 02/11/22 1241

## 2022-02-11 NOTE — ED Triage Notes (Signed)
Patient states he was going to work tripped over his shoe laces and fell attempted to catch his fall extended his arm to catch himself and experienced left elbow pain. Positive left radial pulse

## 2022-02-11 NOTE — ED Provider Triage Note (Signed)
Emergency Medicine Provider Triage Evaluation Note  Dale Mcintosh , a 36 y.o. male  was evaluated in triage.  Pt complains of left elbow pain which began acutely this morning.  He was trying to put on his shoes when he lost his balance; extended his left arm to catch his fall.  Since this time has been experiencing pain in his left elbow which is constant and aggravated with movement.  He has not taken any medications for his pain..  Review of Systems  Positive: As above Negative: As above  Physical Exam  There were no vitals taken for this visit. Gen:   Awake, no distress   Resp:  Normal effort  MSK:   Compartments of the LUE are soft, compressible Other:  Distal radial pulse 2+ in the left upper extremity.  Limited flexion extension of the left elbow secondary to pain.  Medical Decision Making  Medically screening exam initiated at 6:09 AM.  Appropriate orders placed.  [name] was informed that the remainder of the evaluation will be completed by another provider, this initial triage assessment does not replace that evaluation, and the importance of remaining in the ED until their evaluation is complete.  Acute L elbow pain - pending Xray.   Antonietta Breach, PA-C 02/11/22 [phone]

## 2022-02-25 ENCOUNTER — Other Ambulatory Visit: Payer: Self-pay | Admitting: Family Medicine

## 2022-02-25 ENCOUNTER — Other Ambulatory Visit: Payer: Self-pay

## 2022-02-26 ENCOUNTER — Other Ambulatory Visit: Payer: Self-pay

## 2022-02-28 ENCOUNTER — Other Ambulatory Visit: Payer: Self-pay | Admitting: Family Medicine

## 2022-03-01 ENCOUNTER — Other Ambulatory Visit: Payer: Self-pay | Admitting: Family Medicine

## 2022-03-04 ENCOUNTER — Other Ambulatory Visit: Payer: Self-pay | Admitting: Family Medicine

## 2022-03-04 ENCOUNTER — Other Ambulatory Visit: Payer: Self-pay

## 2022-03-05 ENCOUNTER — Other Ambulatory Visit: Payer: Self-pay

## 2022-03-06 ENCOUNTER — Other Ambulatory Visit: Payer: Self-pay

## 2022-04-18 ENCOUNTER — Other Ambulatory Visit: Payer: Self-pay

## 2022-04-18 ENCOUNTER — Other Ambulatory Visit: Payer: Self-pay | Admitting: Family Medicine

## 2022-04-22 ENCOUNTER — Other Ambulatory Visit: Payer: Self-pay

## 2022-05-20 ENCOUNTER — Other Ambulatory Visit: Payer: Self-pay | Admitting: Family Medicine

## 2022-05-20 ENCOUNTER — Other Ambulatory Visit: Payer: Self-pay | Admitting: Physician Assistant

## 2022-05-20 ENCOUNTER — Other Ambulatory Visit: Payer: Self-pay

## 2022-05-20 MED ORDER — VALSARTAN 160 MG PO TABS
160.0000 mg | ORAL_TABLET | Freq: Every day | ORAL | 3 refills | Status: DC
  Filled 2022-05-20 – 2022-06-14 (×3): qty 30, 30d supply, fill #0
  Filled 2022-08-06: qty 30, 30d supply, fill #1
  Filled 2022-09-18: qty 30, 30d supply, fill #2
  Filled 2022-10-29 (×2): qty 30, 30d supply, fill #3

## 2022-05-21 ENCOUNTER — Ambulatory Visit: Payer: Self-pay | Admitting: Nurse Practitioner

## 2022-05-21 NOTE — Telephone Encounter (Signed)
Requested medication (s) are due for refill today: yes  Requested medication (s) are on the active medication list: yes  Last refill:  01/17/22  Future visit scheduled: no  Notes to clinic:  Unable to refill per protocol, courtesy refill already given, routing for provider approval.      Requested Prescriptions  Pending Prescriptions Disp Refills   carvedilol (COREG) 25 MG tablet 60 tablet 0    Sig: Take 1 tablet (25 mg total) by mouth 2 (two) times daily with a meal.     Cardiovascular: Beta Blockers 3 Failed - 05/20/2022 11:58 AM      Failed - Cr in normal range and within 360 days    Creatinine, Ser  Date Value Ref Range Status  11/16/2021 1.90 (H) 0.61 - 1.24 mg/dL Final         Failed - Valid encounter within last 6 months    Recent Outpatient Visits           11 months ago Hyperglycemia   [place], Dionne Bucy, [place]   1 year ago Primary hypertension   [place], Vernia Buff, [place]   1 year ago Essential hypertension   [place], Jarome Matin, RPH-CPP   1 year ago Essential hypertension   [place], Jarome Matin, RPH-CPP   1 year ago Essential hypertension   [place], Jarome Matin, RPH-CPP              Passed - AST in normal range and within 360 days    AST  Date Value Ref Range Status  11/16/2021 28 15 - 41 U/L Final         Passed - ALT in normal range and within 360 days    ALT  Date Value Ref Range Status  11/16/2021 22 0 - 44 U/L Final         Passed - Last BP in normal range    BP Readings from Last 1 Encounters:  02/11/22 114/74         Passed - Last Heart Rate in normal range    Pulse Readings from Last 1 Encounters:  02/11/22 75          amLODipine (NORVASC) 10 MG tablet 30 tablet 0    Sig: Take 1 tablet (10 mg total) by mouth daily.      Cardiovascular: Calcium Channel Blockers 2 Failed - 05/20/2022 11:58 AM      Failed - Valid encounter within last 6 months    Recent Outpatient Visits           11 months ago Hyperglycemia   [place], Dionne Bucy, [place]   1 year ago Primary hypertension   [place], Vernia Buff, NP   1 year ago Essential hypertension   [place], RPH-CPP   1 year ago Essential hypertension   [place], RPH-CPP   1 year ago Essential hypertension   [place], RPH-CPP              Passed - Last BP in normal range    BP Readings from Last 1 Encounters:  02/11/22 114/74  Passed - Last Heart Rate in normal range    Pulse Readings from Last 1 Encounters:  02/11/22 75

## 2022-05-22 ENCOUNTER — Other Ambulatory Visit: Payer: Self-pay

## 2022-05-27 ENCOUNTER — Other Ambulatory Visit: Payer: Self-pay

## 2022-06-14 ENCOUNTER — Other Ambulatory Visit: Payer: Self-pay

## 2022-06-14 ENCOUNTER — Other Ambulatory Visit: Payer: Self-pay | Admitting: Physician Assistant

## 2022-06-14 ENCOUNTER — Encounter: Payer: Self-pay | Admitting: Nurse Practitioner

## 2022-06-14 ENCOUNTER — Ambulatory Visit: Payer: BC Managed Care – PPO | Attending: Nurse Practitioner | Admitting: Nurse Practitioner

## 2022-06-14 ENCOUNTER — Other Ambulatory Visit: Payer: Self-pay | Admitting: Family Medicine

## 2022-06-14 VITALS — BP 129/125 | HR 74 | Ht 71.0 in | Wt 304.0 lb

## 2022-06-14 DIAGNOSIS — D72829 Elevated white blood cell count, unspecified: Secondary | ICD-10-CM

## 2022-06-14 DIAGNOSIS — G4733 Obstructive sleep apnea (adult) (pediatric): Secondary | ICD-10-CM

## 2022-06-14 DIAGNOSIS — I10 Essential (primary) hypertension: Secondary | ICD-10-CM

## 2022-06-14 DIAGNOSIS — R7303 Prediabetes: Secondary | ICD-10-CM

## 2022-06-14 DIAGNOSIS — Z6841 Body Mass Index (BMI) 40.0 and over, adult: Secondary | ICD-10-CM

## 2022-06-14 DIAGNOSIS — F419 Anxiety disorder, unspecified: Secondary | ICD-10-CM

## 2022-06-14 DIAGNOSIS — H6121 Impacted cerumen, right ear: Secondary | ICD-10-CM

## 2022-06-14 DIAGNOSIS — I428 Other cardiomyopathies: Secondary | ICD-10-CM

## 2022-06-14 DIAGNOSIS — F32A Depression, unspecified: Secondary | ICD-10-CM

## 2022-06-14 MED ORDER — CARVEDILOL 25 MG PO TABS
25.0000 mg | ORAL_TABLET | Freq: Two times a day (BID) | ORAL | 1 refills | Status: DC
  Filled 2022-06-14: qty 180, 90d supply, fill #0

## 2022-06-14 MED ORDER — CLONIDINE HCL 0.2 MG PO TABS
0.2000 mg | ORAL_TABLET | Freq: Once | ORAL | Status: AC
  Administered 2022-06-14: 0.2 mg via ORAL

## 2022-06-14 MED ORDER — DEBROX 6.5 % OT SOLN
5.0000 [drp] | Freq: Two times a day (BID) | OTIC | 0 refills | Status: DC
  Filled 2022-06-14: qty 15, 30d supply, fill #0

## 2022-06-14 MED ORDER — METFORMIN HCL 500 MG PO TABS
500.0000 mg | ORAL_TABLET | Freq: Two times a day (BID) | ORAL | 3 refills | Status: DC
  Filled 2022-06-14: qty 180, 90d supply, fill #0

## 2022-06-14 MED ORDER — AMLODIPINE BESYLATE 10 MG PO TABS
10.0000 mg | ORAL_TABLET | Freq: Every day | ORAL | 1 refills | Status: DC
  Filled 2022-06-14: qty 90, 90d supply, fill #0
  Filled 2022-09-18: qty 90, 90d supply, fill #1

## 2022-06-14 MED ORDER — CLONIDINE 0.2 MG/24HR TD PTWK
0.2000 mg | MEDICATED_PATCH | TRANSDERMAL | 4 refills | Status: DC
  Filled 2022-06-14 (×2): qty 4, 28d supply, fill #0

## 2022-06-14 MED ORDER — HYDRALAZINE HCL 10 MG PO TABS
10.0000 mg | ORAL_TABLET | Freq: Three times a day (TID) | ORAL | 0 refills | Status: DC | PRN
  Filled 2022-06-14: qty 90, 30d supply, fill #0

## 2022-06-14 NOTE — Telephone Encounter (Signed)
Requested medication (s) are due for refill today: yes  Requested medication (s) are on the active medication list: yes  Last refill:  06/14/22  Future visit scheduled: yes  Notes to clinic:  Medication was refilled today by provider at [place], no pharmacy receipt confirmation, routing for review.     Requested Prescriptions  Pending Prescriptions Disp Refills   carvedilol (COREG) 25 MG tablet 60 tablet 0    Sig: Take 1 tablet (25 mg total) by mouth 2 (two) times daily with a meal.     Cardiovascular: Beta Blockers 3 Failed - 06/14/2022  2:30 PM      Failed - Cr in normal range and within 360 days    Creatinine, Ser  Date Value Ref Range Status  11/16/2021 1.90 (H) 0.61 - 1.24 mg/dL Final         Failed - Last BP in normal range    BP Readings from Last 1 Encounters:  06/14/22 (!) 129/125         Passed - AST in normal range and within 360 days    AST  Date Value Ref Range Status  11/16/2021 28 15 - 41 U/L Final         Passed - ALT in normal range and within 360 days    ALT  Date Value Ref Range Status  11/16/2021 22 0 - 44 U/L Final         Passed - Last Heart Rate in normal range    Pulse Readings from Last 1 Encounters:  06/14/22 74         Passed - Valid encounter within last 6 months    Recent Outpatient Visits           Today Primary hypertension   [place], Vernia Buff, NP   1 year ago Hyperglycemia   [place], [place]   1 year ago Primary hypertension   [place], Vernia Buff, NP   1 year ago Essential hypertension   [place], RPH-CPP   1 year ago Essential hypertension   [place], Jarome Matin, RPH-CPP       Future Appointments             In 3 months Gildardo Pounds, NP [place]             amLODipine (NORVASC) 10 MG tablet 30 tablet 0    Sig: Take 1 tablet (10 mg total) by mouth daily.     Cardiovascular: Calcium Channel Blockers 2 Failed - 06/14/2022  2:30 PM      Failed - Last BP in normal range    BP Readings from Last 1 Encounters:  06/14/22 (!) 129/125         Passed - Last Heart Rate in normal range    Pulse Readings from Last 1 Encounters:  06/14/22 74         Passed - Valid encounter within last 6 months    Recent Outpatient Visits           Today Primary hypertension   [place], Vernia Buff, NP   1 year ago Hyperglycemia   [place], [place]  1 year ago Primary hypertension   [place], Vernia Buff, NP   1 year ago Essential hypertension   [place], RPH-CPP   1 year ago Essential hypertension   [place], RPH-CPP       Future Appointments             In 3 months Gildardo Pounds, NP [place]

## 2022-06-14 NOTE — Progress Notes (Signed)
Assessment & Plan:  Dale Mcintosh was seen today for hypertension.  Diagnoses and all orders for this visit:  Primary hypertension -     amLODipine (NORVASC) 10 MG tablet; Take 1 tablet (10 mg total) by mouth daily. -     carvedilol (COREG) 25 MG tablet; Take 1 tablet (25 mg total) by mouth 2 (two) times daily with a meal. -     metFORMIN (GLUCOPHAGE) 500 MG tablet; Take 1 tablet (500 mg total) by mouth 2 (two) times daily with a meal. -     cloNIDine (CATAPRES - DOSED IN MG/24 HR) 0.2 mg/24hr patch; Place 1 patch (0.2 mg total) onto the skin once a week. -     hydrALAZINE (APRESOLINE) 10 MG tablet; Take 1 tablet (10 mg total) by mouth up to 3 times a day as needed for systolic blood pressure greater than 180. -     CMP14+EGFR -     Hemoglobin A1c -     cloNIDine (CATAPRES) tablet 0.2 mg Continue all antihypertensives as prescribed.  Reminded to bring in blood pressure log for follow  up appointment.  RECOMMENDATIONS: DASH/Mediterranean Diets are healthier choices for HTN.    Prediabetes -     metFORMIN (GLUCOPHAGE) 500 MG tablet; Take 1 tablet (500 mg total) by mouth 2 (two) times daily with a meal. -     CMP14+EGFR -     Hemoglobin A1c  Non-ischemic cardiomyopathy (HCC) -     hydrALAZINE (APRESOLINE) 10 MG tablet; Take 1 tablet (10 mg total) by mouth up to 3 times a day as needed for systolic blood pressure greater than 180. -     cloNIDine (CATAPRES) tablet 0.2 mg -     Ambulatory referral to Cardiology  Morbid obesity with BMI of 40.0-44.9, adult (HCC) -     Lipid panel  Leukocytosis, unspecified type -     CBC with Differential  Obstructive sleep apnea syndrome -     Split night study; Future  Impacted cerumen of right ear -     carbamide peroxide (DEBROX) 6.5 % OTIC solution; Place 5 drops into the right ear 2 (two) times daily. For 5 days.    Patient has been counseled on age-appropriate routine health concerns for screening and prevention. These are reviewed and  up-to-date. Referrals have been placed accordingly. Immunizations are up-to-date or declined.    Subjective:   Chief Complaint  Patient presents with   Hypertension   HPI Dale Mcintosh 37 y.o. male presents to office today for follow-up to hypertension.  I have not seen Dale Mcintosh myself in a few years.  Past medical history is significant for acute systolic heart failure, poorly controlled hypertension, morbid obesity, anxiety and depression, tobacco dependence  OSA Can't sleep at night. Wakes up tired after only 3 hours of sleep. Excessive daytime sleepiness, fatigue. Risk factors: Morbid obesity, snoring, CHF, HTN, morning headaches. He does not wear a CPAP.  Will order sleep study today  Essential Hypertension Poorly controlled.  He required p.o. clonidine here in the office today.  He has been without his medicines for several months now.  Currently prescribed amlodipine 10 mg daily, carvedilol 25 mg twice daily, clonidine patch 0.2 mg weekly, hydralazine as needed and valsartan 160 mg daily.  He has been lost to follow-up with cardiology for possible Entresto.  I have instructed him that he needs to contact their office for scheduling and I have also made referral today.  He continues to smoke cigarettes and  is currently smoking up to 1 pack/day.  Declines nicotine gum, nicotine patch or p.o. medications today for smoking cessation.   Anxiety Notes increased irritation, agitation and being on edge.  His grandmother recently passed in January and he reports he is still grieving from his mom passing 3 years ago.  Also endorses relationship issues with his children's mother.  Agreeable to referral for counseling today declines SSRI or anxiety lytic.  Prediabetes A1c currently at goal with metformin 500 mg twice daily.  Will repeat labs today Lab Results  Component Value Date   HGBA1C 6.0 (H) 06/06/2021      Review of Systems  Constitutional:  Negative for fever, malaise/fatigue and  weight loss.  HENT:  Positive for hearing loss (Right ear). Negative for nosebleeds.   Eyes: Negative.  Negative for blurred vision, double vision and photophobia.  Respiratory: Negative.  Negative for cough and shortness of breath.   Cardiovascular: Negative.  Negative for chest pain, palpitations and leg swelling.  Gastrointestinal: Negative.  Negative for heartburn, nausea and vomiting.  Musculoskeletal: Negative.  Negative for myalgias.  Neurological: Negative.  Negative for dizziness, focal weakness, seizures and headaches.  Psychiatric/Behavioral:  Positive for depression. Negative for suicidal ideas. The patient is nervous/anxious and has insomnia.     Past Medical History:  Diagnosis Date   Hyperlipidemia    Hypertension    Nonischemic cardiomyopathy ([place])    a. Echo 4/20220: LVEF of 40-45% (coronary CTA at the time showed coronary calcium score of 0 and normal coronaries) - felt to be due to uncontrolled hypertension   Obesity    OSA (obstructive sleep apnea)    Tobacco abuse     Past Surgical History:  Procedure Laterality Date   NO PAST SURGERIES     None      Family History  Problem Relation Age of Onset   Hypertension Mother    Hypertension Father    Diabetes Father     Social History Reviewed with no changes to be made today.   Outpatient Medications Prior to Visit  Medication Sig Dispense Refill   chlorthalidone (HYGROTON) 25 MG tablet Take 1 tablet (25 mg total) by mouth daily. 30 tablet 8   sildenafil (VIAGRA) 100 MG tablet Take 0.5-1 tablets (50-100 mg total) by mouth daily as needed for erectile dysfunction. 10 tablet 6   valsartan (DIOVAN) 160 MG tablet Take 1 tablet (160 mg total) by mouth daily. 60 tablet 3   amLODipine (NORVASC) 10 MG tablet Take 1 tablet (10 mg total) by mouth daily. 30 tablet 0   carvedilol (COREG) 25 MG tablet Take 1 tablet (25 mg total) by mouth 2 (two) times daily with a meal. 60 tablet 0   doxycycline (VIBRA-TABS) 100 MG tablet  Take 1 tablet (100 mg total) by mouth 2 (two) times daily. 14 tablet 0   lidocaine (XYLOCAINE) 2 % solution Use as directed 15 mLs in the mouth or throat as needed for mouth pain. 100 mL 0   metFORMIN (GLUCOPHAGE) 500 MG tablet Take 1 tablet (500 mg total) by mouth 2 (two) times daily with a meal. 180 tablet 3   cloNIDine (CATAPRES - DOSED IN MG/24 HR) 0.2 mg/24hr patch Place 1 patch (0.2 mg total) onto the skin once a week. (Patient not taking: Reported on 06/14/2022) 4 patch 4   hydrALAZINE (APRESOLINE) 10 MG tablet Take 1 tablet by mouth up to 3 times a day as needed for systolic blood pressure greater than 180 (Patient not  taking: Reported on 06/14/2022) 90 tablet 0   No facility-administered medications prior to visit.    Allergies  Allergen Reactions   Bee Venom Anaphylaxis   Bee Venom    Shrimp (Diagnostic)    Shrimp [Shellfish Allergy] Itching       Objective:    BP (!) 129/125   Pulse 74   Ht 5' 11"$  (1.803 m)   Wt (!) 304 lb (137.9 kg)   SpO2 98%   BMI 42.40 kg/m  Wt Readings from Last 3 Encounters:  06/14/22 (!) 304 lb (137.9 kg)  02/11/22 300 lb (136.1 kg)  11/28/21 296 lb 15.4 oz (134.7 kg)    Physical Exam Vitals and nursing note reviewed.  Constitutional:      Appearance: He is well-developed.  HENT:     Head: Normocephalic and atraumatic.     Right Ear: There is impacted cerumen.  Cardiovascular:     Rate and Rhythm: Normal rate and regular rhythm.     Heart sounds: Normal heart sounds. No murmur heard.    No friction rub. No gallop.  Pulmonary:     Effort: Pulmonary effort is normal. No tachypnea or respiratory distress.     Breath sounds: Normal breath sounds. No decreased breath sounds, wheezing, rhonchi or rales.  Chest:     Chest wall: No tenderness.  Abdominal:     General: Bowel sounds are normal.     Palpations: Abdomen is soft.  Musculoskeletal:        General: Normal range of motion.     Cervical back: Normal range of motion.  Skin:     General: Skin is warm and dry.  Neurological:     Mental Status: He is alert and oriented to person, place, and time.     Coordination: Coordination normal.  Psychiatric:        Behavior: Behavior normal. Behavior is cooperative.        Thought Content: Thought content normal.        Judgment: Judgment normal.          Patient has been counseled extensively about nutrition and exercise as well as the importance of adherence with medications and regular follow-up. The patient was given clear instructions to go to ER or return to medical center if symptoms don't improve, worsen or new problems develop. The patient verbalized understanding.   Follow-up: Return in about 3 months (around 09/12/2022).   Gildardo Pounds, FNP-BC [facility] [place], [place]   06/14/2022, 3:29 PM

## 2022-06-14 NOTE — Telephone Encounter (Signed)
Requested medication (s) are due for refill today: yes  Requested medication (s) are on the active medication list: yes  Last refill:  06/14/22  Future visit scheduled: yes  Notes to clinic:  Not received at pharmacy. Refilled today by provider, routing for approval     Requested Prescriptions  Pending Prescriptions Disp Refills   metFORMIN (GLUCOPHAGE) 500 MG tablet 180 tablet 3    Sig: Take 1 tablet (500 mg total) by mouth 2 (two) times daily with a meal.     Endocrinology:  Diabetes - Biguanides Failed - 06/14/2022  2:31 PM      Failed - Cr in normal range and within 360 days    Creatinine, Ser  Date Value Ref Range Status  11/16/2021 1.90 (H) 0.61 - 1.24 mg/dL Final         Failed - HBA1C is between 0 and 7.9 and within 180 days    Hgb A1c MFr Bld  Date Value Ref Range Status  06/06/2021 6.0 (H) 4.8 - 5.6 % Final    Comment:             Prediabetes: 5.7 - 6.4          Diabetes: >6.4          Glycemic control for adults with diabetes: <7.0          Failed - eGFR in normal range and within 360 days    GFR calc Af Amer  Date Value Ref Range Status  02/01/2020 >60 >60 mL/min Final   GFR, Estimated  Date Value Ref Range Status  11/16/2021 44 (L) >60 mL/min Final    Comment:    (NOTE) Calculated using the CKD-EPI Creatinine Equation (2021)    eGFR  Date Value Ref Range Status  08/01/2021 68 >59 mL/min/1.73 Final         Failed - B12 Level in normal range and within 720 days    No results found for: "VITAMINB12"       Failed - CBC within normal limits and completed in the last 12 months    WBC  Date Value Ref Range Status  11/16/2021 9.0 4.0 - 10.5 K/uL Final   RBC  Date Value Ref Range Status  11/16/2021 5.06 4.22 - 5.81 MIL/uL Final   Hemoglobin  Date Value Ref Range Status  11/16/2021 15.3 13.0 - 17.0 g/dL Final  07/03/2020 14.4 13.0 - 17.7 g/dL Final   HCT  Date Value Ref Range Status  11/16/2021 45.0 39.0 - 52.0 % Final   Hematocrit  Date  Value Ref Range Status  07/03/2020 45.7 37.5 - 51.0 % Final   MCHC  Date Value Ref Range Status  11/16/2021 32.6 30.0 - 36.0 g/dL Final   [facility]  Date Value Ref Range Status  11/16/2021 28.1 26.0 - 34.0 pg Final   MCV  Date Value Ref Range Status  11/16/2021 86.0 80.0 - 100.0 fL Final  07/03/2020 88 79 - 97 fL Final   No results found for: "PLTCOUNTKUC", "LABPLAT", "POCPLA" RDW  Date Value Ref Range Status  11/16/2021 14.8 11.5 - 15.5 % Final  07/03/2020 13.6 11.6 - 15.4 % Final         Passed - Valid encounter within last 6 months    Recent Outpatient Visits           Today Primary hypertension   [place], Vernia Buff, NP   1 year ago Hyperglycemia   [facility]  [place], Dionne Bucy, [place]   1 year ago Primary hypertension   [place], Vernia Buff, NP   1 year ago Essential hypertension   [place], RPH-CPP   1 year ago Essential hypertension   [place], RPH-CPP       Future Appointments             In 3 months Gildardo Pounds, NP [place]

## 2022-06-15 LAB — CBC WITH DIFFERENTIAL/PLATELET
Basophils Absolute: 0.1 10*3/uL (ref 0.0–0.2)
Basos: 1 %
EOS (ABSOLUTE): 0.3 10*3/uL (ref 0.0–0.4)
Eos: 3 %
Hematocrit: 44.3 % (ref 37.5–51.0)
Hemoglobin: 14.1 g/dL (ref 13.0–17.7)
Immature Grans (Abs): 0 10*3/uL (ref 0.0–0.1)
Immature Granulocytes: 1 %
Lymphocytes Absolute: 1.9 10*3/uL (ref 0.7–3.1)
Lymphs: 21 %
MCH: 27.7 pg (ref 26.6–33.0)
MCHC: 31.8 g/dL (ref 31.5–35.7)
MCV: 87 fL (ref 79–97)
Monocytes Absolute: 0.9 10*3/uL (ref 0.1–0.9)
Monocytes: 10 %
Neutrophils Absolute: 5.8 10*3/uL (ref 1.4–7.0)
Neutrophils: 64 %
Platelets: 211 10*3/uL (ref 150–450)
RBC: 5.09 x10E6/uL (ref 4.14–5.80)
RDW: 13.7 % (ref 11.6–15.4)
WBC: 8.9 10*3/uL (ref 3.4–10.8)

## 2022-06-15 LAB — LIPID PANEL
Chol/HDL Ratio: 3.4 ratio (ref 0.0–5.0)
Cholesterol, Total: 174 mg/dL (ref 100–199)
HDL: 51 mg/dL (ref >39)
LDL Chol Calc (NIH): 106 mg/dL — ABNORMAL HIGH (ref 0–99)
Triglycerides: 93 mg/dL (ref 0–149)
VLDL Cholesterol Cal: 17 mg/dL (ref 5–40)

## 2022-06-15 LAB — CMP14+EGFR
ALT: 21 IU/L (ref 0–44)
AST: 22 IU/L (ref 0–40)
Albumin/Globulin Ratio: 1.6 (ref 1.2–2.2)
Albumin: 4.4 g/dL (ref 4.1–5.1)
Alkaline Phosphatase: 65 IU/L (ref 44–121)
BUN/Creatinine Ratio: 13 (ref 9–20)
BUN: 16 mg/dL (ref 6–20)
Bilirubin Total: 0.2 mg/dL (ref 0.0–1.2)
CO2: 25 mmol/L (ref 20–29)
Calcium: 9.8 mg/dL (ref 8.7–10.2)
Chloride: 103 mmol/L (ref 96–106)
Creatinine, Ser: 1.22 mg/dL (ref 0.76–1.27)
Globulin, Total: 2.7 g/dL (ref 1.5–4.5)
Glucose: 88 mg/dL (ref 70–99)
Potassium: 3.9 mmol/L (ref 3.5–5.2)
Sodium: 144 mmol/L (ref 134–144)
Total Protein: 7.1 g/dL (ref 6.0–8.5)
eGFR: 79 mL/min/{1.73_m2} (ref >59)

## 2022-06-15 LAB — HEMOGLOBIN A1C
Est. average glucose Bld gHb Est-mCnc: 111 mg/dL
Hgb A1c MFr Bld: 5.5 % (ref 4.8–5.6)

## 2022-06-16 ENCOUNTER — Other Ambulatory Visit: Payer: Self-pay

## 2022-06-16 ENCOUNTER — Other Ambulatory Visit: Payer: Self-pay | Admitting: Nurse Practitioner

## 2022-06-16 DIAGNOSIS — R7303 Prediabetes: Secondary | ICD-10-CM

## 2022-06-16 DIAGNOSIS — I10 Essential (primary) hypertension: Secondary | ICD-10-CM

## 2022-06-16 MED ORDER — METFORMIN HCL 500 MG PO TABS
500.0000 mg | ORAL_TABLET | Freq: Two times a day (BID) | ORAL | 3 refills | Status: DC
  Filled 2022-06-16: qty 180, 90d supply, fill #0

## 2022-06-17 ENCOUNTER — Other Ambulatory Visit: Payer: Self-pay

## 2022-06-21 ENCOUNTER — Encounter (HOSPITAL_COMMUNITY): Payer: Self-pay

## 2022-06-21 ENCOUNTER — Ambulatory Visit (HOSPITAL_COMMUNITY)
Admission: EM | Admit: 2022-06-21 | Discharge: 2022-06-21 | Disposition: A | Discharge status: Home / Self Care | Payer: BC Managed Care – PPO | Attending: Internal Medicine | Admitting: Internal Medicine

## 2022-06-21 DIAGNOSIS — S29012A Strain of muscle and tendon of back wall of thorax, initial encounter: Secondary | ICD-10-CM

## 2022-06-21 MED ORDER — METHOCARBAMOL 500 MG PO TABS: 500.0000 mg | ORAL_TABLET | Freq: Two times a day (BID) | ORAL | 0 refills | Status: DC

## 2022-06-21 MED ORDER — KETOROLAC TROMETHAMINE 30 MG/ML IJ SOLN
30.0000 mg | Freq: Once | INTRAMUSCULAR | Status: AC
  Administered 2022-06-21: 30 mg via INTRAMUSCULAR

## 2022-06-21 MED ORDER — ACETAMINOPHEN 325 MG PO TABS
ORAL_TABLET | ORAL | Status: AC
  Filled 2022-06-21: qty 3

## 2022-06-21 MED ORDER — KETOROLAC TROMETHAMINE 30 MG/ML IJ SOLN
INTRAMUSCULAR | Status: AC
  Filled 2022-06-21: qty 1

## 2022-06-21 MED ORDER — ACETAMINOPHEN 325 MG PO TABS
975.0000 mg | ORAL_TABLET | Freq: Once | ORAL | Status: AC
  Administered 2022-06-21: 975 mg via ORAL
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
## 2022-07-16 ENCOUNTER — Ambulatory Visit (HOSPITAL_BASED_OUTPATIENT_CLINIC_OR_DEPARTMENT_OTHER): Payer: BC Managed Care – PPO | Attending: Nurse Practitioner | Admitting: Internal Medicine

## 2022-07-16 VITALS — Ht 71.0 in | Wt 304.0 lb

## 2022-07-16 DIAGNOSIS — G4733 Obstructive sleep apnea (adult) (pediatric): Secondary | ICD-10-CM | POA: Insufficient documentation

## 2022-07-20 DIAGNOSIS — G4733 Obstructive sleep apnea (adult) (pediatric): Secondary | ICD-10-CM

## 2022-07-20 NOTE — Procedures (Signed)
Patient Name: Dale Mcintosh, Fish Date: 07/16/2022 Gender: Male D.O.B: March 26, 1986 Age (years): 36 Referring Provider: Gildardo Pounds NP Height (inches): 71 Interpreting Physician: Baird Lyons MD, ABSM Weight (lbs): 304 RPSGT: Carolin Coy BMI: 42 MRN: [id number] Neck Size: 17.50  CLINICAL INFORMATION Sleep Study Type: Split Night CPAP Indication for sleep study: Excessive Daytime Sleepiness, Obesity, Snoring Epworth Sleepiness Score: 7  SLEEP STUDY TECHNIQUE As per the AASM Manual for the Scoring of Sleep and Associated Events v2.3 (April 2016) with a hypopnea requiring 4% desaturations.  The channels recorded and monitored were frontal, central and occipital EEG, electrooculogram (EOG), submentalis EMG (chin), nasal and oral airflow, thoracic and abdominal wall motion, anterior tibialis EMG, snore microphone, electrocardiogram, and pulse oximetry. Continuous positive airway pressure (CPAP) was initiated when the patient met split night criteria and was titrated according to treat sleep-disordered breathing.  MEDICATIONS Medications self-administered by patient taken the night of the study : none reported  RESPIRATORY PARAMETERS Diagnostic  Total AHI (/hr): 22.4 RDI (/hr): 29.5 OA Index (/hr): 0.9 CA Index (/hr): 0.0 REM AHI (/hr): N/A NREM AHI (/hr): 22.4 Supine AHI (/hr): 23.3 Non-supine AHI (/hr): 17.9 Min O2 Sat (%): 77.0 Mean O2 (%): 92.5 Time below 88% (min): 4.9   Titration  Optimal Pressure (cm): 16 AHI at Optimal Pressure (/hr): 1.4 Min O2 at Optimal Pressure (%): 92.0 Supine % at Optimal (%): 25 Sleep % at Optimal (%): 96   SLEEP ARCHITECTURE The recording time for the entire night was 369 minutes.  During a baseline period of 157.3 minutes, the patient slept for 134.1 minutes in REM and nonREM, yielding a sleep efficiency of 85.3%. Sleep onset after lights out was 17.7 minutes with a REM latency of N/A minutes. The patient spent 13.4% of the night in  stage N1 sleep, 86.6% in stage N2 sleep, 0.0% in stage N3 and 0% in REM.  During the titration period of 206.0 minutes, the patient slept for 150.0 minutes in REM and nonREM, yielding a sleep efficiency of 72.8%. Sleep onset after CPAP initiation was 22.2 minutes with a REM latency of 82.5 minutes. The patient spent 6.3% of the night in stage N1 sleep, 52.0% in stage N2 sleep, 0.0% in stage N3 and 41.7% in REM.  CARDIAC DATA The 2 lead EKG demonstrated sinus rhythm. The mean heart rate was 100.0 beats per minute. Other EKG findings include: PVCs.  LEG MOVEMENT DATA The total Periodic Limb Movements of Sleep (PLMS) were 0. The PLMS index was 0.0 .  IMPRESSIONS - Moderate obstructive sleep apnea occurred during the diagnostic portion of the study(AHI = 22.4/hour). An optimal PAP pressure was selected for this patient (16 cm of water) - No significant central sleep apnea occurred during the diagnostic portion of the study (CAI = 0.0/hour). - Oxygen desaturation was noted during the diagnostic portion of the study (Min O2 = 77.0%). Minimum O2 saturation on CPAP 16 was 92%. - The patient snored with moderate snoring volume during the diagnostic portion of the study. - EKG findings include PVCs. - Clinically significant periodic limb movements did not occur during sleep.  DIAGNOSIS - Obstructive Sleep Apnea (G47.33)  RECOMMENDATIONS - Trial of CPAP therapy on 16 cm H2O or autopap 10-20.  - Patient used a Medium size Fisher&Paykel Full Face Simplus mask and heated humidification. - Be careful with alcohol, sedatives and other CNS depressants that may worsen sleep apnea and disrupt normal sleep architecture. - Sleep hygiene should be reviewed to assess factors  that may improve sleep quality. - Weight management and regular exercise should be initiated or continued.  [Electronically signed] 07/20/2022 01:16 PM  Baird Lyons MD, [place], American Board of Sleep Medicine NPI:  [id number]                        [place] [place] [facility]  ELECTRONICALLY SIGNED ON:  07/20/2022, 1:13 PM [place] PH: [phone]   FX: [phone] [place]

## 2022-08-06 ENCOUNTER — Other Ambulatory Visit: Payer: Self-pay

## 2022-08-07 ENCOUNTER — Other Ambulatory Visit: Payer: Self-pay

## 2022-08-19 ENCOUNTER — Telehealth: Payer: Self-pay

## 2022-08-19 NOTE — Progress Notes (Signed)
Patient attempted to be outreached by Brianna Yarborough, PharmD Candidate on 4/12 to discuss hypertension. Left voicemail for patient to return our call at their convenience at [phone].   Brianna Yarborough, PharmD Candidate   Tarry Blayney, Pharm.D. PGY-2 Ambulatory Care Pharmacy Resident 

## 2022-09-12 ENCOUNTER — Telehealth: Payer: Self-pay

## 2022-09-12 NOTE — Progress Notes (Signed)
Patient attempted to be outreached by Saanvika Vazques, PharmD Candidate on 09/12/22 to discuss hypertension. Left voicemail for patient to return our call at their convenience at [phone].  Cameka Rae, Student-PharmD  

## 2022-09-13 ENCOUNTER — Ambulatory Visit: Payer: BC Managed Care – PPO | Attending: Nurse Practitioner | Admitting: Nurse Practitioner

## 2022-09-13 ENCOUNTER — Encounter: Payer: Self-pay | Admitting: Nurse Practitioner

## 2022-09-13 ENCOUNTER — Other Ambulatory Visit: Payer: Self-pay

## 2022-09-13 VITALS — BP 143/87 | HR 104 | Ht 71.0 in | Wt 301.2 lb

## 2022-09-13 DIAGNOSIS — I10 Essential (primary) hypertension: Secondary | ICD-10-CM | POA: Diagnosis not present

## 2022-09-13 DIAGNOSIS — I428 Other cardiomyopathies: Secondary | ICD-10-CM | POA: Diagnosis not present

## 2022-09-13 DIAGNOSIS — G4733 Obstructive sleep apnea (adult) (pediatric): Secondary | ICD-10-CM | POA: Diagnosis not present

## 2022-09-13 MED ORDER — HYDRALAZINE HCL 25 MG PO TABS
25.0000 mg | ORAL_TABLET | Freq: Three times a day (TID) | ORAL | 1 refills | Status: DC
  Filled 2022-09-13: qty 180, 60d supply, fill #0

## 2022-09-13 NOTE — Progress Notes (Signed)
Assessment & Plan:  Dale Mcintosh was seen today for hypertension.  Diagnoses and all orders for this visit:  Primary hypertension DOSE CHANGE WITH HYDRALAZINE 10 to  25 mg TID Continue all other medications as prescribed -     hydrALAZINE (APRESOLINE) 25 MG tablet; Take 1 tablet (25 mg total) by mouth 3 (three) times daily.  Non-ischemic cardiomyopathy (HCC) Instructed to follow-up with Dr. Antoine Mcintosh as soon as possible -     hydrALAZINE (APRESOLINE) 25 MG tablet; Take 1 tablet (25 mg total) by mouth 3 (three) times daily.  OSA on CPAP -     For home use only DME continuous positive airway pressure (CPAP)    Patient has been counseled on age-appropriate routine health concerns for screening and prevention. These are reviewed and up-to-date. Referrals have been placed accordingly. Immunizations are up-to-date or declined.    Subjective:   Chief Complaint  Patient presents with   Hypertension   Hypertension Pertinent negatives include no blurred vision, chest pain, headaches, malaise/fatigue, palpitations or shortness of breath.   Dale Mcintosh 37 y.o. male presents to office today for follow up to HTN  Past medical history is significant for acute systolic heart failure, poorly controlled hypertension, morbid obesity, anxiety and depression, tobacco dependence    CPAP orders were faxed today to DME company   HTN Blood pressure is elevated today.  He is currently taking valsartan 160 mg daily, hydralazine 10 mg 3 times daily, chlorthalidone 25 mg daily and amlodipine 10 mg daily.  Clonidine patch caused a rash on arm so he stopped using this. He has been lost to follow-up with cardiology for possible Entresto.  I have instructed him that he needs to contact their office for scheduling and I have also made referral today.  He continues to smoke cigarettes and is currently smoking up to 1 pack/day.  Declines nicotine gum, nicotine patch or p.o. medications today for smoking  cessation. BP Readings from Last 3 Encounters:  09/13/22 (!) 149/100  06/21/22 (!) 149/95  06/14/22 (!) 129/125     Review of Systems  Constitutional:  Negative for fever, malaise/fatigue and weight loss.  HENT: Negative.  Negative for nosebleeds.   Eyes: Negative.  Negative for blurred vision, double vision and photophobia.  Respiratory: Negative.  Negative for cough and shortness of breath.   Cardiovascular: Negative.  Negative for chest pain, palpitations and leg swelling.  Gastrointestinal: Negative.  Negative for heartburn, nausea and vomiting.  Musculoskeletal: Negative.  Negative for myalgias.  Neurological: Negative.  Negative for dizziness, focal weakness, seizures and headaches.  Psychiatric/Behavioral: Negative.  Negative for suicidal ideas.     Past Medical History:  Diagnosis Date   Hyperlipidemia    Hypertension    Nonischemic cardiomyopathy (HCC)    a. Echo 4/20220: LVEF of 40-45% (coronary CTA at the time showed coronary calcium score of 0 and normal coronaries) - felt to be due to uncontrolled hypertension   Obesity    OSA (obstructive sleep apnea)    Tobacco abuse     Past Surgical History:  Procedure Laterality Date   NO PAST SURGERIES     None      Family History  Problem Relation Age of Onset   Hypertension Mother    Hypertension Father    Diabetes Father     Social History Reviewed with no changes to be made today.   Outpatient Medications Prior to Visit  Medication Sig Dispense Refill   amLODipine (NORVASC) 10 MG tablet Take 1  tablet (10 mg total) by mouth daily. 90 tablet 1   carbamide peroxide (DEBROX) 6.5 % OTIC solution Place 5 drops into the right ear 2 (two) times daily. For 5 days. 15 mL 0   carvedilol (COREG) 25 MG tablet Take 1 tablet (25 mg total) by mouth 2 (two) times daily with a meal. 180 tablet 1   chlorthalidone (HYGROTON) 25 MG tablet Take 1 tablet (25 mg total) by mouth daily. 30 tablet 8   metFORMIN (GLUCOPHAGE) 500 MG  tablet Take 1 tablet (500 mg total) by mouth 2 (two) times daily with a meal. 180 tablet 3   valsartan (DIOVAN) 160 MG tablet Take 1 tablet (160 mg total) by mouth daily. 60 tablet 3   hydrALAZINE (APRESOLINE) 10 MG tablet Take 1 tablet (10 mg total) by mouth up to 3 times a day as needed for systolic blood pressure greater than 180. 90 tablet 0   cloNIDine (CATAPRES - DOSED IN MG/24 HR) 0.2 mg/24hr patch Place 1 patch (0.2 mg total) onto the skin once a week. (Patient not taking: Reported on 09/13/2022) 4 patch 4   methocarbamol (ROBAXIN) 500 MG tablet Take 1 tablet (500 mg total) by mouth 2 (two) times daily. (Patient not taking: Reported on 09/13/2022) 20 tablet 0   sildenafil (VIAGRA) 100 MG tablet Take 0.5-1 tablets (50-100 mg total) by mouth daily as needed for erectile dysfunction. (Patient not taking: Reported on 09/13/2022) 10 tablet 6   No facility-administered medications prior to visit.    Allergies  Allergen Reactions   Bee Venom Anaphylaxis   Bee Venom    Shrimp (Diagnostic)    Shrimp [Shellfish Allergy] Itching       Objective:    BP (!) 149/100 (BP Location: Left Arm, Patient Position: Sitting, Cuff Size: Large)   Pulse (!) 104   Ht 5\' 11"  (1.803 m)   Wt (!) 301 lb 3.2 oz (136.6 kg)   SpO2 96%   BMI 42.01 kg/m  Wt Readings from Last 3 Encounters:  09/13/22 (!) 301 lb 3.2 oz (136.6 kg)  07/16/22 (!) 304 lb (137.9 kg)  06/21/22 (!) 304 lb (137.9 kg)    Physical Exam Vitals and nursing note reviewed.  Constitutional:      Appearance: He is well-developed. He is obese.  HENT:     Head: Normocephalic and atraumatic.  Cardiovascular:     Rate and Rhythm: Normal rate and regular rhythm.     Heart sounds: Normal heart sounds. No murmur heard.    No friction rub. No gallop.  Pulmonary:     Effort: Pulmonary effort is normal. No tachypnea or respiratory distress.     Breath sounds: Normal breath sounds. No decreased breath sounds, wheezing, rhonchi or rales.  Chest:      Chest wall: No tenderness.  Abdominal:     General: Bowel sounds are normal.     Palpations: Abdomen is soft.  Musculoskeletal:        General: Normal range of motion.     Cervical back: Normal range of motion.  Skin:    General: Skin is warm and dry.  Neurological:     Mental Status: He is alert and oriented to person, place, and time.     Coordination: Coordination normal.  Psychiatric:        Behavior: Behavior normal. Behavior is cooperative.        Thought Content: Thought content normal.        Judgment: Judgment normal.  Patient has been counseled extensively about nutrition and exercise as well as the importance of adherence with medications and regular follow-up. The patient was given clear instructions to go to ER or return to medical center if symptoms don't improve, worsen or new problems develop. The patient verbalized understanding.   Follow-up: Return in about 4 weeks (around 10/11/2022) for BP CHECK WITH LUKE or me. If no appointments have him see Cassie.   Claiborne Rigg, FNP-BC [facility] [place], [place] [phone]   09/13/2022, 2:32 PM

## 2022-09-18 ENCOUNTER — Other Ambulatory Visit: Payer: Self-pay

## 2022-10-11 ENCOUNTER — Ambulatory Visit: Payer: BC Managed Care – PPO | Admitting: Pharmacist

## 2022-10-28 ENCOUNTER — Ambulatory Visit (HOSPITAL_COMMUNITY)
Admission: EM | Admit: 2022-10-28 | Discharge: 2022-10-28 | Disposition: A | Discharge status: Home / Self Care | Payer: BC Managed Care – PPO

## 2022-10-28 ENCOUNTER — Other Ambulatory Visit: Payer: Self-pay

## 2022-10-28 ENCOUNTER — Encounter (HOSPITAL_COMMUNITY): Payer: Self-pay | Admitting: *Deleted

## 2022-10-28 DIAGNOSIS — R519 Headache, unspecified: Secondary | ICD-10-CM

## 2022-10-28 DIAGNOSIS — I161 Hypertensive emergency: Secondary | ICD-10-CM | POA: Diagnosis not present

## 2022-10-28 NOTE — ED Triage Notes (Addendum)
Pt reports an ongoing HA. Pt takes Excedrin for HA . Pt has had the HA for to weeks.

## 2022-10-28 NOTE — Discharge Instructions (Signed)
Please go to the emergency room for further evaluation and management as we discussed. 

## 2022-10-28 NOTE — ED Provider Notes (Signed)
[facility]    CSN: [id number] Arrival date & time: 10/28/22  1948      History   Chief Complaint Chief Complaint  Patient presents with   Headache   Hypertension    HPI Dale Mcintosh is a 37 y.o. male.   Patient presents today with a 2-week history of intermittent headaches that have worsened in the past several days.  Currently pain is rated 7/8 on a 0-10 pain scale, localized to frontal region, described as pressure, no alleviating factors and notified.  He reports that currently this is not the worst headache of his life but is worse when he wakes up in the morning and at that point it is the worst headache of his life.  He has been taking Excedrin with only temporary relief of symptoms.  He denies history of primary headache disorder including migraines.  Denies any recent head injury or medication changes; does report his dose of chlorthalidone was increased prior to symptoms beginning.  He does have a history of hypertension and is currently prescribed multiple agents including amlodipine, carvedilol, chlorthalidone, hydralazine, valsartan and reports compliance with this medication regimen.  He denies any chest pain, shortness of breath.  Denies any focal weakness, dysarthria, visual disturbance.  Does report that he gets spots in his vision with sneezing but denies any ongoing visual disturbance.    Past Medical History:  Diagnosis Date   Hyperlipidemia    Hypertension    Nonischemic cardiomyopathy (HCC)    a. Echo 4/20220: LVEF of 40-45% (coronary CTA at the time showed coronary calcium score of 0 and normal coronaries) - felt to be due to uncontrolled hypertension   Obesity    OSA (obstructive sleep apnea)    Tobacco abuse     Patient Active Problem List   Diagnosis Date Noted   OSA (obstructive sleep apnea) 07/16/2022   Nonischemic cardiomyopathy (HCC)    Hypertension    Excessive daytime sleepiness 02/01/2017   Morbid obesity (HCC) 02/01/2017    Snoring 02/01/2017    Past Surgical History:  Procedure Laterality Date   NO PAST SURGERIES     None         Home Medications    Prior to Admission medications   Medication Sig Start Date End Date Taking? Authorizing Provider  amLODipine (NORVASC) 10 MG tablet Take 1 tablet (10 mg total) by mouth daily. 06/14/22  Yes Claiborne Rigg, NP  carvedilol (COREG) 25 MG tablet Take 1 tablet (25 mg total) by mouth 2 (two) times daily with a meal. 06/14/22  Yes Claiborne Rigg, NP  chlorthalidone (HYGROTON) 25 MG tablet Take 1 tablet (25 mg total) by mouth daily. 01/18/22 10/28/22 Yes Juanda Crumble K, PA-C  hydrALAZINE (APRESOLINE) 25 MG tablet Take 1 tablet (25 mg total) by mouth 3 (three) times daily. 09/13/22  Yes Claiborne Rigg, NP  metFORMIN (GLUCOPHAGE) 500 MG tablet Take 1 tablet (500 mg total) by mouth 2 (two) times daily with a meal. 06/16/22  Yes Claiborne Rigg, NP  valsartan (DIOVAN) 160 MG tablet Take 1 tablet (160 mg total) by mouth daily. 05/20/22  Yes Rollene Rotunda, MD  hydrochlorothiazide (HYDRODIURIL) 25 MG tablet Take 1 tablet (25 mg total) by mouth daily. 06/27/20 06/14/22  Rema Fendt, NP  losartan (COZAAR) 100 MG tablet Take 1 tablet (100 mg total) by mouth daily. 06/27/20 06/14/22  Rema Fendt, NP  nitroGLYCERIN (NITROSTAT) 0.4 MG SL tablet Place 1 tablet (0.4 mg total) under the  tongue every 5 (five) minutes as needed for chest pain (per CT heart protocol). 08/29/18 06/14/22  Glade Lloyd, MD    Family History Family History  Problem Relation Age of Onset   Hypertension Mother    Hypertension Father    Diabetes Father     Social History Social History   Tobacco Use   Smoking status: Every Day    Years: 20    Types: Cigarettes   Smokeless tobacco: Current  Vaping Use   Vaping Use: Never used  Substance Use Topics   Alcohol use: Never    Comment: occ   Drug use: Yes    Types: Marijuana    Comment: daily     Allergies   Bee venom, Bee venom, Shrimp  (diagnostic), and Shrimp [shellfish allergy]   Review of Systems Review of Systems  Constitutional:  Positive for activity change. Negative for appetite change, fatigue and fever.  Eyes:  Negative for visual disturbance.  Respiratory:  Negative for shortness of breath.   Cardiovascular:  Negative for chest pain and palpitations.  Gastrointestinal:  Negative for abdominal pain, diarrhea, nausea and vomiting.  Neurological:  Positive for headaches. Negative for dizziness, seizures, syncope, speech difficulty, weakness, light-headedness and numbness.     Physical Exam Triage Vital Signs ED Triage Vitals  Enc Vitals Group     BP 10/28/22 1959 (!) 204/146     Pulse Rate 10/28/22 1959 83     Resp 10/28/22 1959 18     Temp 10/28/22 1959 98.3 F (36.8 C)     Temp src --      SpO2 10/28/22 1959 93 %     Weight --      Height --      Head Circumference --      Peak Flow --      Pain Score 10/28/22 1956 9     Pain Loc --      Pain Edu? --      Excl. in GC? --    No data found.  Updated Vital Signs BP (!) 175/115   Pulse 83   Temp 98.3 F (36.8 C)   Resp 18   SpO2 93%   Visual Acuity Right Eye Distance:   Left Eye Distance:   Bilateral Distance:    Right Eye Near:   Left Eye Near:    Bilateral Near:     Physical Exam Vitals reviewed.  Constitutional:      General: He is awake.     Appearance: Normal appearance. He is well-developed. He is not ill-appearing.     Comments: Very pleasant male appears stated age in no acute distress sitting comfortably in exam room  HENT:     Head: Normocephalic and atraumatic.     Right Ear: Tympanic membrane, ear canal and external ear normal. There is impacted cerumen.     Left Ear: Tympanic membrane, ear canal and external ear normal. There is impacted cerumen.     Nose: Nose normal.     Mouth/Throat:     Tongue: Tongue does not deviate from midline.     Pharynx: Uvula midline. No oropharyngeal exudate or posterior oropharyngeal  erythema.  Eyes:     Extraocular Movements: Extraocular movements intact.     Conjunctiva/sclera: Conjunctivae normal.     Pupils: Pupils are equal, round, and reactive to light.  Cardiovascular:     Rate and Rhythm: Normal rate and regular rhythm.     Heart sounds: Normal heart sounds, S1  normal and S2 normal. No murmur heard. Pulmonary:     Effort: Pulmonary effort is normal. No accessory muscle usage or respiratory distress.     Breath sounds: Normal breath sounds. No stridor. No wheezing, rhonchi or rales.     Comments: Clear to auscultation bilaterally Musculoskeletal:     Comments: Strength 5/5 bilateral upper and lower extremities  Neurological:     General: No focal deficit present.     Mental Status: He is alert and oriented to person, place, and time.     Cranial Nerves: Cranial nerves 2-12 are intact.     Motor: Motor function is intact.     Coordination: Coordination is intact.     Gait: Gait is intact.     Comments: No focal neurological defect on exam.  Psychiatric:        Behavior: Behavior is cooperative.      UC Treatments / Results  Labs (all labs ordered are listed, but only abnormal results are displayed) Labs Reviewed - No data to display  EKG   Radiology No results found.  Procedures Procedures (including critical care time)  Medications Ordered in UC Medications - No data to display  Initial Impression / Assessment and Plan / UC Course  I have reviewed the triage vital signs and the nursing notes.  Pertinent labs & imaging results that were available during my care of the patient were reviewed by me and considered in my medical decision making (see chart for details).     Patient is hypertensive and given his constellation of symptoms I am concerned about hypertensive emergency.  We discussed that given his very elevated blood pressure in clinic with associated headache the safest thing to do is go to the emergency room since we do not have  imaging capabilities in urgent care.  Patient was initially hesitant but ultimately did agree to go.  He was stable at time of discharge and safe for private transport.   Final Clinical Impressions(s) / UC Diagnoses   Final diagnoses:  Hypertensive emergency  Bad headache     Discharge Instructions      Please go to the emergency room for further evaluation and management as we discussed.     ED Prescriptions   None    PDMP not reviewed this encounter.   Jeani Hawking, PA-C 10/28/22 2035

## 2022-10-29 ENCOUNTER — Encounter: Payer: Self-pay | Admitting: Nurse Practitioner

## 2022-10-29 ENCOUNTER — Other Ambulatory Visit: Payer: Self-pay | Admitting: Nurse Practitioner

## 2022-10-29 ENCOUNTER — Other Ambulatory Visit: Payer: Self-pay

## 2022-10-29 ENCOUNTER — Encounter: Payer: Self-pay | Admitting: Pharmacist

## 2022-10-29 ENCOUNTER — Ambulatory Visit: Payer: BC Managed Care – PPO | Attending: Family Medicine | Admitting: Pharmacist

## 2022-10-29 VITALS — BP 158/94 | HR 77

## 2022-10-29 DIAGNOSIS — I10 Essential (primary) hypertension: Secondary | ICD-10-CM | POA: Diagnosis not present

## 2022-10-29 MED ORDER — AMLODIPINE BESYLATE 10 MG PO TABS
10.0000 mg | ORAL_TABLET | Freq: Every day | ORAL | 1 refills | Status: DC
  Filled 2022-10-29: qty 90, 90d supply, fill #0

## 2022-10-29 NOTE — Progress Notes (Signed)
   S:    PCP: Zelda   Patient arrives in good spirits. Presents to the clinic for hypertension evaluation, counseling, and management. Patient was referred and last seen by Primary Care Provider on 09/13/2022. At that visit, BP was 143/87. Hydralazine dose was increased.    Today, patient comes in for BP check. He is behind on some refills. He plans on picking up his valsartan and chlorthalidone today, however, valsartan was last filled for a 30-day on 09/18/22 and chlorthalidone was last filled for a 30-day supply on 08/07/2022. Of note, carvedilol was last filled for a 90-day supply on 06/14/2022. Amlodipine is the only antihypertensive on file that is up-to-date on refills.   Current BP Medications include: amlodipine 10 mg daily, carvedilol 25 mg BID, chlorthalidone 25 mg daily, valsartan 160 mg daily   Dietary habits include: reports trying to avoid salt as much as he can, usually doesn't eat until dinner but says he averages 2 meals a day. Exercise habits include: works out for 30 minutes a day  Family History: hypertension (mother and father) Tobacco: 1/2 PPD Alcohol: occasionally  O:  Home BP readings: reports 160s-170s SBP at home  Today's Vitals   10/29/22 1640  BP: (!) 158/94  Pulse: 77    Last 3 Office BP readings: BP Readings from Last 3 Encounters:  10/29/22 (!) 158/94  10/28/22 (!) 175/115  09/13/22 (!) 143/87   BMET    Component Value Date/Time   NA 144 06/14/2022 1500   K 3.9 06/14/2022 1500   CL 103 06/14/2022 1500   CO2 25 06/14/2022 1500   GLUCOSE 88 06/14/2022 1500   GLUCOSE 98 11/16/2021 0028   BUN 16 06/14/2022 1500   CREATININE 1.22 06/14/2022 1500   CALCIUM 9.8 06/14/2022 1500   GFRNONAA 44 (L) 11/16/2021 0023   GFRAA >60 02/01/2020 1622    Renal function: CrCl cannot be calculated (Patient's most recent lab result is older than the maximum 21 days allowed.).  Clinical ASCVD: No  The ASCVD Risk score (Arnett DK, et al., 2019) failed to calculate  for the following reasons:   The 2019 ASCVD risk score is only valid for ages 54 to 42  A/P: Hypertension longstanding currently uncontrolled on current medications. BP Goal = < 130/80 mmHg. Medication adherence reported, however, his fill history does not reflect this. Adherence encouraged. Switch amlodipine, valsartan, and chlorthalidone to PM dosing. Continue carvedilol and hydralazine as prescribed.   -Continue amlodipine 10 mg daily -Continue chlorthalidone 25 mg daily  -Continue carvedilol 25 mg BID -Continue valsartan 160 mg daily. -Counseled on lifestyle modifications for blood pressure control including reduced dietary sodium, increased exercise, adequate sleep. -Contact information provided for CPAP machine. OSA significantly impeding our ability to help control his BP.   Results reviewed and written information provided.   Total time in face-to-face counseling 15 minutes.   F/U Clinic Visit w/ me in 2-3 weeks.      Butch Penny, PharmD, P[place]y [place], CPP Clinical Pharmacist [facility] [phone]

## 2022-10-30 ENCOUNTER — Other Ambulatory Visit: Payer: Self-pay

## 2022-11-21 ENCOUNTER — Ambulatory Visit: Payer: Self-pay | Admitting: Pharmacist

## 2022-12-02 ENCOUNTER — Ambulatory Visit: Payer: BC Managed Care – PPO | Admitting: Pharmacist

## 2023-01-07 ENCOUNTER — Encounter: Payer: Self-pay | Admitting: Pharmacist

## 2023-01-13 ENCOUNTER — Ambulatory Visit: Payer: Self-pay | Attending: Nurse Practitioner | Admitting: Nurse Practitioner

## 2023-01-13 ENCOUNTER — Encounter: Payer: Self-pay | Admitting: Nurse Practitioner

## 2023-01-13 ENCOUNTER — Other Ambulatory Visit: Payer: Self-pay

## 2023-01-13 VITALS — BP 152/90 | HR 87 | Ht 71.0 in | Wt 316.4 lb

## 2023-01-13 DIAGNOSIS — I10 Essential (primary) hypertension: Secondary | ICD-10-CM

## 2023-01-13 DIAGNOSIS — I428 Other cardiomyopathies: Secondary | ICD-10-CM

## 2023-01-13 DIAGNOSIS — R7303 Prediabetes: Secondary | ICD-10-CM

## 2023-01-13 LAB — POCT GLYCOSYLATED HEMOGLOBIN (HGB A1C): HbA1c, POC (prediabetic range): 5.9 % (ref 5.7–6.4)

## 2023-01-13 MED ORDER — CARVEDILOL 25 MG PO TABS
25.0000 mg | ORAL_TABLET | Freq: Two times a day (BID) | ORAL | 1 refills | Status: DC
  Filled 2023-01-13 – 2023-02-18 (×2): qty 180, 90d supply, fill #0

## 2023-01-13 MED ORDER — CHLORTHALIDONE 25 MG PO TABS
25.0000 mg | ORAL_TABLET | Freq: Every day | ORAL | 1 refills | Status: DC
  Filled 2023-01-13 – 2023-02-18 (×3): qty 90, 90d supply, fill #0

## 2023-01-13 MED ORDER — AMLODIPINE BESYLATE 10 MG PO TABS
10.0000 mg | ORAL_TABLET | Freq: Every day | ORAL | 1 refills | Status: DC
  Filled 2023-01-13 – 2023-02-18 (×3): qty 90, 90d supply, fill #0

## 2023-01-13 MED ORDER — HYDRALAZINE HCL 25 MG PO TABS
25.0000 mg | ORAL_TABLET | Freq: Three times a day (TID) | ORAL | 1 refills | Status: DC
  Filled 2023-01-13 – 2023-02-18 (×2): qty 180, 60d supply, fill #0

## 2023-01-13 MED ORDER — METFORMIN HCL 500 MG PO TABS
500.0000 mg | ORAL_TABLET | Freq: Two times a day (BID) | ORAL | 1 refills | Status: DC
  Filled 2023-01-13 – 2023-02-18 (×3): qty 180, 90d supply, fill #0
  Filled 2023-08-22 – 2023-09-02 (×2): qty 180, 90d supply, fill #1

## 2023-01-13 MED ORDER — VALSARTAN 160 MG PO TABS
160.0000 mg | ORAL_TABLET | Freq: Every day | ORAL | 1 refills | Status: DC
  Filled 2023-01-13: qty 30, 30d supply, fill #0
  Filled 2023-02-18 (×2): qty 90, 90d supply, fill #0

## 2023-01-13 NOTE — Progress Notes (Signed)
Assessment & Plan:  Dale Mcintosh was seen today for medical management of chronic issues.  Diagnoses and all orders for this visit:  Prediabetes -     POCT glycosylated hemoglobin (Hb A1C) -     metFORMIN (GLUCOPHAGE) 500 MG tablet; Take 1 tablet (500 mg total) by mouth 2 (two) times daily with a meal.  Primary hypertension -     amLODipine (NORVASC) 10 MG tablet; Take 1 tablet (10 mg total) by mouth daily. -     carvedilol (COREG) 25 MG tablet; Take 1 tablet (25 mg total) by mouth 2 (two) times daily with a meal. -     hydrALAZINE (APRESOLINE) 25 MG tablet; Take 1 tablet (25 mg total) by mouth 3 (three) times daily. -     metFORMIN (GLUCOPHAGE) 500 MG tablet; Take 1 tablet (500 mg total) by mouth 2 (two) times daily with a meal.  Non-ischemic cardiomyopathy (HCC) -     hydrALAZINE (APRESOLINE) 25 MG tablet; Take 1 tablet (25 mg total) by mouth 3 (three) times daily.  Other orders -     chlorthalidone (HYGROTON) 25 MG tablet; Take 1 tablet (25 mg total) by mouth daily. -     valsartan (DIOVAN) 160 MG tablet; Take 1 tablet (160 mg total) by mouth daily.    Patient has been counseled on age-appropriate routine health concerns for screening and prevention. These are reviewed and up-to-date. Referrals have been placed accordingly. Immunizations are up-to-date or declined.    Subjective:   Chief Complaint  Patient presents with   Medical Management of Chronic Issues   HPI Dale Mcintosh 37 y.o. male presents to office today for follow-up for A1c.  States his ophthalmologist told him that he had changes in the size of his pupils that could be related to diabetes and to follow-up with PCP to be evaluated for diabetes.  HTN Blood pressure is poorly controlled.  He has not picked up his CPAP machine. States he lost his job and his insurance.  Blood pressure medications are overdue for refills.  There is medication and dietary nonadherence.  He is currently prescribed amlodipine, carvedilol,  chlorthalidone, valsartan and hydralazine BP Readings from Last 3 Encounters:  01/13/23 (!) 152/90  10/29/22 (!) 158/94  10/28/22 (!) 175/115     Prediabetes Well-controlled with metformin 500 mg twice daily. Lab Results  Component Value Date   HGBA1C 5.9 01/13/2023    Lab Results  Component Value Date   HGBA1C 5.5 06/14/2022    Review of Systems  Constitutional:  Negative for fever, malaise/fatigue and weight loss.  HENT: Negative.  Negative for nosebleeds.   Eyes: Negative.  Negative for blurred vision, double vision and photophobia.  Respiratory: Negative.  Negative for cough and shortness of breath.   Cardiovascular: Negative.  Negative for chest pain, palpitations and leg swelling.  Gastrointestinal: Negative.  Negative for heartburn, nausea and vomiting.  Musculoskeletal: Negative.  Negative for myalgias.  Neurological: Negative.  Negative for dizziness, focal weakness, seizures and headaches.  Psychiatric/Behavioral: Negative.  Negative for suicidal ideas.     Past Medical History:  Diagnosis Date   Hyperlipidemia    Hypertension    Nonischemic cardiomyopathy (HCC)    a. Echo 4/20220: LVEF of 40-45% (coronary CTA at the time showed coronary calcium score of 0 and normal coronaries) - felt to be due to uncontrolled hypertension   Obesity    OSA (obstructive sleep apnea)    Tobacco abuse     Past Surgical History:  Procedure  Laterality Date   NO PAST SURGERIES     None      Family History  Problem Relation Age of Onset   Hypertension Mother    Hypertension Father    Diabetes Father     Social History Reviewed with no changes to be made today.   Outpatient Medications Prior to Visit  Medication Sig Dispense Refill   amLODipine (NORVASC) 10 MG tablet Take 1 tablet (10 mg total) by mouth daily. 90 tablet 1   carvedilol (COREG) 25 MG tablet Take 1 tablet (25 mg total) by mouth 2 (two) times daily with a meal. 180 tablet 1   chlorthalidone (HYGROTON) 25 MG  tablet Take 1 tablet (25 mg total) by mouth daily. 30 tablet 8   hydrALAZINE (APRESOLINE) 25 MG tablet Take 1 tablet (25 mg total) by mouth 3 (three) times daily. 180 tablet 1   metFORMIN (GLUCOPHAGE) 500 MG tablet Take 1 tablet (500 mg total) by mouth 2 (two) times daily with a meal. 180 tablet 3   valsartan (DIOVAN) 160 MG tablet Take 1 tablet (160 mg total) by mouth daily. 60 tablet 3   No facility-administered medications prior to visit.    Allergies  Allergen Reactions   Bee Venom Anaphylaxis   Bee Venom    Shrimp (Diagnostic)    Shrimp [Shellfish Allergy] Itching       Objective:    BP (!) 152/90 (BP Location: Left Arm, Patient Position: Sitting, Cuff Size: Large)   Pulse 87   Ht 5\' 11"  (1.803 m)   Wt (!) 316 lb 6.4 oz (143.5 kg)   SpO2 97%   BMI 44.13 kg/m  Wt Readings from Last 3 Encounters:  01/13/23 (!) 316 lb 6.4 oz (143.5 kg)  09/13/22 (!) 301 lb 3.2 oz (136.6 kg)  07/16/22 (!) 304 lb (137.9 kg)    Physical Exam Vitals and nursing note reviewed.  Constitutional:      Appearance: He is well-developed.  HENT:     Head: Normocephalic and atraumatic.  Cardiovascular:     Rate and Rhythm: Normal rate and regular rhythm.     Heart sounds: Normal heart sounds. No murmur heard.    No friction rub. No gallop.  Pulmonary:     Effort: Pulmonary effort is normal. No tachypnea or respiratory distress.     Breath sounds: Normal breath sounds. No decreased breath sounds, wheezing, rhonchi or rales.  Chest:     Chest wall: No tenderness.  Abdominal:     General: Bowel sounds are normal.     Palpations: Abdomen is soft.  Musculoskeletal:        General: Normal range of motion.     Cervical back: Normal range of motion.  Skin:    General: Skin is warm and dry.  Neurological:     Mental Status: He is alert and oriented to person, place, and time.     Coordination: Coordination normal.  Psychiatric:        Behavior: Behavior normal. Behavior is cooperative.         Thought Content: Thought content normal.        Judgment: Judgment normal.          Patient has been counseled extensively about nutrition and exercise as well as the importance of adherence with medications and regular follow-up. The patient was given clear instructions to go to ER or return to medical center if symptoms don't improve, worsen or new problems develop. The patient verbalized understanding.  Follow-up: Return in about 3 months (around 04/14/2023).   Claiborne Rigg, FNP-BC [facility] [place], [place] [phone]   01/13/2023, 3:28 PM

## 2023-01-17 ENCOUNTER — Other Ambulatory Visit: Payer: Self-pay

## 2023-01-21 ENCOUNTER — Encounter (HOSPITAL_COMMUNITY): Payer: Self-pay | Admitting: Emergency Medicine

## 2023-01-21 ENCOUNTER — Other Ambulatory Visit: Payer: Self-pay

## 2023-01-21 ENCOUNTER — Ambulatory Visit (HOSPITAL_COMMUNITY)
Admission: EM | Admit: 2023-01-21 | Discharge: 2023-01-21 | Disposition: A | Discharge status: Home / Self Care | Payer: Self-pay | Attending: Physician Assistant | Admitting: Physician Assistant

## 2023-01-21 DIAGNOSIS — R109 Unspecified abdominal pain: Secondary | ICD-10-CM

## 2023-01-21 LAB — POCT URINALYSIS DIP (MANUAL ENTRY)
Blood, UA: NEGATIVE
Glucose, UA: NEGATIVE mg/dL
Leukocytes, UA: NEGATIVE
Nitrite, UA: NEGATIVE
Protein Ur, POC: 30 mg/dL — AB
Spec Grav, UA: >=1.03 — AB (ref 1.010–1.025)
Urobilinogen, UA: 0.2 U/dL
pH, UA: 5.5 (ref 5.0–8.0)

## 2023-01-21 MED ORDER — ALUM & MAG HYDROXIDE-SIMETH 200-200-20 MG/5ML PO SUSP
ORAL | Status: AC
  Filled 2023-01-21: qty 30

## 2023-01-21 MED ORDER — ALUM & MAG HYDROXIDE-SIMETH 200-200-20 MG/5ML PO SUSP
30.0000 mL | Freq: Once | ORAL | Status: AC
  Administered 2023-01-21: 30 mL via ORAL

## 2023-01-21 MED ORDER — AMOXICILLIN-POT CLAVULANATE 875-125 MG PO TABS: 1.0000 | ORAL_TABLET | Freq: Two times a day (BID) | ORAL | 0 refills | Status: DC

## 2023-01-21 MED ORDER — LIDOCAINE VISCOUS HCL 2 % MT SOLN
OROMUCOSAL | Status: AC
  Filled 2023-01-21: qty 15

## 2023-01-21 MED ORDER — LIDOCAINE VISCOUS HCL 2 % MT SOLN
15.0000 mL | Freq: Once | OROMUCOSAL | Status: AC
  Administered 2023-01-21: 15 mL via OROMUCOSAL

## 2023-01-21 NOTE — Discharge Instructions (Signed)
As we discussed, the safest thing to do would be to go to the emergency room since we do not have imaging capabilities here to figure out exactly what is causing your pain.  It is possible this is related to diverticulitis as we discussed.  You can start Augmentin twice daily for 7 days which helps treat this infection.  If your symptoms are not improving significantly within the next 12 hours or if anything worsens and you have severe abdominal pain, fever, nausea, vomiting, blood in your stool, weakness you need to go to the ER immediately.  Assuming that you do improve please follow-up with your primary care within the next 1 to 2 days.

## 2023-01-21 NOTE — ED Provider Notes (Signed)
[facility]    CSN: [id number] Arrival date & time: 01/21/23  1912      History   Chief Complaint Chief Complaint  Patient presents with   Abdominal Pain   Generalized Body Aches    HPI Dale Mcintosh is a 37 y.o. male.   Patient presents today with a 1 day history of left-sided abdominal pain.  He reports pain is rated 10 on a 0-10 pain scale, described as sharp, no aggravating relieving factors identified.  He has been taking Excedrin as well as NyQuil without improvement of symptoms.  He denies any nausea or vomiting.  He has not had any changes in bowel habits and had a normal bowel movement earlier today.  He has had constipation in the past but denies additional gastrointestinal disorder including diverticulitis.  He has had nephrolithiasis several years ago but this is not the same kind of pain.  Denies any urinary symptoms including frequency, urgency, hematuria.  He does report some bodyaches and chills denies any cough or congestion.  Denies any known sick contacts.  Denies changes to diet, increased NSAID use, alcohol consumption.  He is having difficulty with daily duties as result of his symptoms.    Past Medical History:  Diagnosis Date   Hyperlipidemia    Hypertension    Nonischemic cardiomyopathy (HCC)    a. Echo 4/20220: LVEF of 40-45% (coronary CTA at the time showed coronary calcium score of 0 and normal coronaries) - felt to Mcintosh due to uncontrolled hypertension   Obesity    OSA (obstructive sleep apnea)    Tobacco abuse     Patient Active Problem List   Diagnosis Date Noted   OSA (obstructive sleep apnea) 07/16/2022   Nonischemic cardiomyopathy (HCC)    Hypertension    Excessive daytime sleepiness 02/01/2017   Morbid obesity (HCC) 02/01/2017   Snoring 02/01/2017    Past Surgical History:  Procedure Laterality Date   NO PAST SURGERIES     None         Home Medications    Prior to Admission medications   Medication Sig Start Date End  Date Taking? Authorizing Provider  amoxicillin-clavulanate (AUGMENTIN) 875-125 MG tablet Take 1 tablet by mouth every 12 (twelve) hours. 01/21/23  Yes Levin Dagostino K, PA-C  amLODipine (NORVASC) 10 MG tablet Take 1 tablet (10 mg total) by mouth daily. 01/13/23   Claiborne Rigg, NP  carvedilol (COREG) 25 MG tablet Take 1 tablet (25 mg total) by mouth 2 (two) times daily with a meal. 01/13/23   Claiborne Rigg, NP  chlorthalidone (HYGROTON) 25 MG tablet Take 1 tablet (25 mg total) by mouth daily. 01/13/23   Claiborne Rigg, NP  hydrALAZINE (APRESOLINE) 25 MG tablet Take 1 tablet (25 mg total) by mouth 3 (three) times daily. 01/13/23   Claiborne Rigg, NP  metFORMIN (GLUCOPHAGE) 500 MG tablet Take 1 tablet (500 mg total) by mouth 2 (two) times daily with a meal. 01/13/23   Claiborne Rigg, NP  valsartan (DIOVAN) 160 MG tablet Take 1 tablet (160 mg total) by mouth daily. 01/13/23   Claiborne Rigg, NP  hydrochlorothiazide (HYDRODIURIL) 25 MG tablet Take 1 tablet (25 mg total) by mouth daily. 06/27/20 06/14/22  Rema Fendt, NP  losartan (COZAAR) 100 MG tablet Take 1 tablet (100 mg total) by mouth daily. 06/27/20 06/14/22  Rema Fendt, NP  nitroGLYCERIN (NITROSTAT) 0.4 MG SL tablet Place 1 tablet (0.4 mg total) under the tongue every  5 (five) minutes as needed for chest pain (per CT heart protocol). 08/29/18 06/14/22  Glade Lloyd, MD    Family History Family History  Problem Relation Age of Onset   Hypertension Mother    Hypertension Father    Diabetes Father     Social History Social History   Tobacco Use   Smoking status: Every Day    Types: Cigarettes   Smokeless tobacco: Current  Vaping Use   Vaping status: Never Used  Substance Use Topics   Alcohol use: Never    Comment: occ   Drug use: Yes    Types: Marijuana    Comment: daily     Allergies   Bee venom, Bee venom, Shrimp (diagnostic), and Shrimp [shellfish allergy]   Review of Systems Review of Systems  Constitutional:   Positive for activity change and appetite change. Negative for fatigue and fever.  Respiratory:  Negative for cough and shortness of breath.   Cardiovascular:  Negative for chest pain.  Gastrointestinal:  Positive for abdominal pain. Negative for blood in stool, constipation, diarrhea, nausea and vomiting.  Genitourinary:  Positive for flank pain. Negative for dysuria, frequency and urgency.  Musculoskeletal:  Positive for arthralgias and myalgias.     Physical Exam Triage Vital Signs ED Triage Vitals  Encounter Vitals Group     BP 01/21/23 1955 (!) 150/85     Systolic BP Percentile --      Diastolic BP Percentile --      Pulse Rate 01/21/23 1955 82     Resp 01/21/23 1955 18     Temp 01/21/23 1955 98.8 F (37.1 C)     Temp Source 01/21/23 1955 Oral     SpO2 01/21/23 1955 98 %     Weight --      Height --      Head Circumference --      Peak Flow --      Pain Score 01/21/23 1956 10     Pain Loc --      Pain Education --      Exclude from Growth Chart --    No data found.  Updated Vital Signs BP (!) 150/85 (BP Location: Right Arm)   Pulse 82   Temp 98.8 F (37.1 C) (Oral)   Resp 18   SpO2 98%   Visual Acuity Right Eye Distance:   Left Eye Distance:   Bilateral Distance:    Right Eye Near:   Left Eye Near:    Bilateral Near:     Physical Exam Vitals reviewed.  Constitutional:      General: He is awake.     Appearance: Normal appearance. He is well-developed. He is ill-appearing.     Comments: Very pleasant male appears stated age sitting in exam room holding his left abdomen  HENT:     Head: Normocephalic and atraumatic.     Mouth/Throat:     Pharynx: No oropharyngeal exudate, posterior oropharyngeal erythema or uvula swelling.  Cardiovascular:     Rate and Rhythm: Normal rate and regular rhythm.     Heart sounds: Normal heart sounds, S1 normal and S2 normal. No murmur heard. Pulmonary:     Effort: Pulmonary effort is normal.     Breath sounds: Normal  breath sounds. No stridor. No wheezing, rhonchi or rales.     Comments: Clear to auscultation bilaterally Abdominal:     General: Bowel sounds are normal.     Palpations: Abdomen is soft.     Tenderness:  There is abdominal tenderness in the left upper quadrant and left lower quadrant. There is left CVA tenderness. There is no right CVA tenderness, guarding or rebound.     Comments: Tender to palpation over left abdomen.  Positive CVA tenderness.  Neurological:     Mental Status: He is alert.  Psychiatric:        Behavior: Behavior is cooperative.      UC Treatments / Results  Labs (all labs ordered are listed, but only abnormal results are displayed) Labs Reviewed  POCT URINALYSIS DIP (MANUAL ENTRY) - Abnormal; Notable for the following components:      Result Value   Clarity, UA cloudy (*)    Bilirubin, UA small (*)    Ketones, POC UA trace (5) (*)    Spec Grav, UA >=1.030 (*)    Protein Ur, POC =30 (*)    All other components within normal limits    EKG   Radiology No results found.  Procedures Procedures (including critical care time)  Medications Ordered in UC Medications  alum & mag hydroxide-simeth (MAALOX/MYLANTA) 200-200-20 MG/5ML suspension 30 mL (30 mLs Oral Given 01/21/23 2046)  lidocaine (XYLOCAINE) 2 % viscous mouth solution 15 mL (15 mLs Mouth/Throat Given 01/21/23 2046)    Initial Impression / Assessment and Plan / UC Course  I have reviewed the triage vital signs and the nursing notes.  Pertinent labs & imaging results that were available during my care of the patient were reviewed by me and considered in my medical decision making (see chart for details).     Patient is well-appearing, afebrile, nontoxic, nontachycardic.  He did have significant tenderness to palpation on exam and we discussed potential utility of going to the emergency room but patient declined this.  Urine was obtained that showed dehydration but did not show any significant blood  making nephrolithiasis less concerning.  I am concerned for diverticulitis given his clinical presentation.  Discussed that the safest thing to Mcintosh go to the emergency room since we do not have imaging capabilities in urgent care but patient declined to do this.  He was given a GI cocktail which provided some relief of symptoms.  Again discussed potentially to leave going to the ER but he declined this.  Ultimately I did offer to provide a course of antibiotics and Augmentin was sent to his pharmacy.  We discussed that if this does not improve his symptoms significantly in the next 12 hours he would need to go to the emergency room.  We also discussed that if anything worsens he needs to go to the emergency room immediately including persistent/severe abdominal pain, fever, nausea, vomiting, changes in his bowel habits.  Assuming that he does have improvement of symptoms I recommended that he follow-up with his primary care within 1 to 2 days for further evaluation and management.  Discussed again that the safest thing is to go to the ER and if his symptoms do not improve significantly he must go to the ER for further evaluation to which he expressed understanding.  Final Clinical Impressions(s) / UC Diagnoses   Final diagnoses:  Left sided abdominal pain     Discharge Instructions      As we discussed, the safest thing to do would Mcintosh to go to the emergency room since we do not have imaging capabilities here to figure out exactly what is causing your pain.  It is possible this is related to diverticulitis as we discussed.  You can start Augmentin  twice daily for 7 days which helps treat this infection.  If your symptoms are not improving significantly within the next 12 hours or if anything worsens and you have severe abdominal pain, fever, nausea, vomiting, blood in your stool, weakness you need to go to the ER immediately.  Assuming that you do improve please follow-up with your primary care within the  next 1 to 2 days.     ED Prescriptions     Medication Sig Dispense Auth. Provider   amoxicillin-clavulanate (AUGMENTIN) 875-125 MG tablet Take 1 tablet by mouth every 12 (twelve) hours. 14 tablet Lenzie Sandler, Noberto Retort, PA-C      PDMP not reviewed this encounter.   Jeani Hawking, PA-C 01/21/23 2103

## 2023-01-21 NOTE — ED Triage Notes (Signed)
Generalized body aches, abd pain with nausea and vomiting since yesterday.

## 2023-01-22 ENCOUNTER — Ambulatory Visit (HOSPITAL_COMMUNITY): Payer: Self-pay

## 2023-02-18 ENCOUNTER — Other Ambulatory Visit: Payer: Self-pay

## 2023-03-08 ENCOUNTER — Encounter (HOSPITAL_COMMUNITY): Payer: Self-pay | Admitting: Emergency Medicine

## 2023-03-08 ENCOUNTER — Other Ambulatory Visit: Payer: Self-pay

## 2023-03-08 ENCOUNTER — Emergency Department (HOSPITAL_COMMUNITY)
Admission: EM | Admit: 2023-03-08 | Discharge: 2023-03-09 | Disposition: A | Discharge status: Home / Self Care | Payer: Self-pay | Attending: Emergency Medicine | Admitting: Emergency Medicine

## 2023-03-08 DIAGNOSIS — Z7984 Long term (current) use of oral hypoglycemic drugs: Secondary | ICD-10-CM | POA: Insufficient documentation

## 2023-03-08 DIAGNOSIS — Z79899 Other long term (current) drug therapy: Secondary | ICD-10-CM | POA: Insufficient documentation

## 2023-03-08 DIAGNOSIS — M545 Low back pain, unspecified: Secondary | ICD-10-CM

## 2023-03-08 DIAGNOSIS — M546 Pain in thoracic spine: Secondary | ICD-10-CM | POA: Insufficient documentation

## 2023-03-08 NOTE — ED Triage Notes (Signed)
Pt arrived via POV c/o lower back pain that radiates down to buttock and legs, pt states pain is brought on by standing for long periods of time. Reports pain has been ongoing for approx 2 weeks. Denies any recent injury.

## 2023-03-09 NOTE — Discharge Instructions (Signed)
Look into get some new insoles for your shoes.  You may need to take breaks at work so you are not standing for so many hours at a time.  You may take ibuprofen, available over-the-counter as needed according to label instructions for pain.  Get rechecked if you have fevers, numbness or weakness.

## 2023-03-09 NOTE — ED Provider Notes (Signed)
[facility] Provider Note   CSN: [id number] Arrival date & time: 03/08/23  2101     History  Chief Complaint  Patient presents with   Back Pain    Dale Mcintosh is a 37 y.o. male.  The history is provided by the patient.  Back Pain Dale Mcintosh is a 37 y.o. male who presents to the Emergency Department complaining of back pain.  He presents to the emergency department for evaluation of 2 weeks of midline low back pain that radiates to his buttocks bilaterally.  Pain only comes on if he has been standing for 3 hours.  He stands for 8 hours at a time at work.  The only thing that relieves it is going to sit.  Pain is worse with ambulation after it starts.  No associated fevers, nausea, vomiting, abdominal pain, night sweats, numbness, weakness, incontinence.  He has a history of hypertension.  No reported injuries.     Home Medications Prior to Admission medications   Medication Sig Start Date End Date Taking? Authorizing Provider  amLODipine (NORVASC) 10 MG tablet Take 1 tablet (10 mg total) by mouth daily. 01/13/23   Claiborne Rigg, NP  amoxicillin-clavulanate (AUGMENTIN) 875-125 MG tablet Take 1 tablet by mouth every 12 (twelve) hours. 01/21/23   Raspet, Noberto Retort, PA-C  carvedilol (COREG) 25 MG tablet Take 1 tablet (25 mg total) by mouth 2 (two) times daily with a meal. 01/13/23   Claiborne Rigg, NP  chlorthalidone (HYGROTON) 25 MG tablet Take 1 tablet (25 mg total) by mouth daily. 01/13/23   Claiborne Rigg, NP  hydrALAZINE (APRESOLINE) 25 MG tablet Take 1 tablet (25 mg total) by mouth 3 (three) times daily. 01/13/23   Claiborne Rigg, NP  metFORMIN (GLUCOPHAGE) 500 MG tablet Take 1 tablet (500 mg total) by mouth 2 (two) times daily with a meal. 01/13/23   Claiborne Rigg, NP  valsartan (DIOVAN) 160 MG tablet Take 1 tablet (160 mg total) by mouth daily. 01/13/23   Claiborne Rigg, NP  hydrochlorothiazide (HYDRODIURIL) 25 MG tablet Take 1  tablet (25 mg total) by mouth daily. 06/27/20 06/14/22  Rema Fendt, NP  losartan (COZAAR) 100 MG tablet Take 1 tablet (100 mg total) by mouth daily. 06/27/20 06/14/22  Rema Fendt, NP  nitroGLYCERIN (NITROSTAT) 0.4 MG SL tablet Place 1 tablet (0.4 mg total) under the tongue every 5 (five) minutes as needed for chest pain (per CT heart protocol). 08/29/18 06/14/22  Glade Lloyd, MD      Allergies    Bee venom, Bee venom, Shrimp (diagnostic), and Shrimp [shellfish allergy]    Review of Systems   Review of Systems  Musculoskeletal:  Positive for back pain.  All other systems reviewed and are negative.   Physical Exam Updated Vital Signs BP (!) 139/92 (BP Location: Right Arm)   Pulse 71   Temp 98.3 F (36.8 C) (Oral)   Resp 17   Ht 5\' 11"  (1.803 m)   Wt (!) 142.9 kg   SpO2 100%   BMI 43.93 kg/m  Physical Exam Vitals and nursing note reviewed.  Constitutional:      Appearance: He is well-developed.  HENT:     Head: Normocephalic and atraumatic.  Cardiovascular:     Rate and Rhythm: Normal rate and regular rhythm.  Pulmonary:     Effort: Pulmonary effort is normal. No respiratory distress.  Abdominal:     Palpations: Abdomen is soft.  Tenderness: There is no abdominal tenderness. There is no guarding or rebound.  Musculoskeletal:        General: No swelling or tenderness.     Comments: 2+ DP pulses bilaterally  Skin:    General: Skin is warm and dry.  Neurological:     Mental Status: He is alert and oriented to person, place, and time.     Comments: 5 out of 5 strength in bilateral lower extremities with sensation to light touch intact in all 4 extremities  Psychiatric:        Behavior: Behavior normal.     ED Results / Procedures / Treatments   Labs (all labs ordered are listed, but only abnormal results are displayed) Labs Reviewed - No data to display  EKG None  Radiology No results found.  Procedures Procedures    Medications Ordered in  ED Medications - No data to display  ED Course/ Medical Decision Making/ A&P                                 Medical Decision Making  Patient with history of hypertension here for evaluation of atraumatic low back pain.  He is neurologically intact on evaluation and well-perfused.  No red flags.  He does not have any current pain on evaluation in the emergency department.  Presentation is not consistent with acute ischemia, epidural abscess, cauda equina, AAA.  Discussed with patient home care for back pain.  Recommend resting more often while working.  He may take as needed ibuprofen.  Also discussed trying new shoes or new insoles.  Return precautions discussed.        Final Clinical Impression(s) / ED Diagnoses Final diagnoses:  Acute midline low back pain without sciatica    Rx / DC Orders ED Discharge Orders     None         Tilden Fossa, MD 03/09/23 [phone]

## 2023-04-14 ENCOUNTER — Other Ambulatory Visit: Payer: Self-pay

## 2023-04-14 ENCOUNTER — Ambulatory Visit: Payer: Self-pay | Attending: Nurse Practitioner | Admitting: Nurse Practitioner

## 2023-04-14 ENCOUNTER — Encounter: Payer: Self-pay | Admitting: Nurse Practitioner

## 2023-04-14 VITALS — BP 111/73 | HR 81 | Ht 71.0 in | Wt 318.0 lb

## 2023-04-14 DIAGNOSIS — M5410 Radiculopathy, site unspecified: Secondary | ICD-10-CM

## 2023-04-14 DIAGNOSIS — R7303 Prediabetes: Secondary | ICD-10-CM

## 2023-04-14 MED ORDER — MELOXICAM 15 MG PO TABS
15.0000 mg | ORAL_TABLET | Freq: Every day | ORAL | 1 refills | Status: DC
Start: 2023-04-14 — End: 2023-06-04
  Filled 2023-04-14: qty 30, 30d supply, fill #0

## 2023-04-14 MED ORDER — METHOCARBAMOL 750 MG PO TABS
750.0000 mg | ORAL_TABLET | Freq: Four times a day (QID) | ORAL | 1 refills | Status: DC
  Filled 2023-04-14: qty 60, 15d supply, fill #0

## 2023-04-14 NOTE — Progress Notes (Signed)
Assessment & Plan:  Dale Mcintosh was seen today for medical management of chronic issues.  Diagnoses and all orders for this visit:  Back pain with right-sided radiculopathy -     DG Lumbar Spine Complete; Future -     methocarbamol (ROBAXIN) 750 MG tablet; Take 1 tablet (750 mg total) by mouth 4 (four) times daily. -     meloxicam (MOBIC) 15 MG tablet; Take 1 tablet (15 mg total) by mouth daily. If no improvement in 4-6 weeks will need xray Patient was urged to apply for the financial assistance program.  They were instructed to inquire at the front desk about the application process for the [place] discount, orange card or other financial assistance.        Patient has been counseled on age-appropriate routine health concerns for screening and prevention. These are reviewed and up-to-date. Referrals have been placed accordingly. Immunizations are up-to-date or declined.    Subjective:   Chief Complaint  Patient presents with   Medical Management of Chronic Issues    Dale Mcintosh 37 y.o. male presents to office today for low back pain and right leg numbness.  Back Pain: Patient presents for presents evaluation of low back problems.  Symptoms have been present for several weeks and include numbness in RLE, weakness in RLE, and pain in RLE . Initial inciting event: none. Symptoms are worst: at work. Alleviating factors identifiable by patient are recumbency and sitting. Exacerbating factors identifiable by patient are bending forwards, bending sideways, standing, and walking. Treatments so far initiated by patient:  OCT NSAIDs  Previous lower back problems: none. Previous workup: none. Previous treatments: none.  Has been taking ibuprofen OTC with no relief of pain.  Pain described as sharp.  Works at [employer].    Blood pressure is well controlled.  BP Readings from Last 3 Encounters:  04/14/23 111/73  03/09/23 (!) 139/92  01/21/23 (!) 150/85      Review of Systems   Constitutional:  Negative for fever, malaise/fatigue and weight loss.  HENT: Negative.  Negative for nosebleeds.   Eyes: Negative.  Negative for blurred vision, double vision and photophobia.  Respiratory: Negative.  Negative for cough and shortness of breath.   Cardiovascular: Negative.  Negative for chest pain, palpitations and leg swelling.  Gastrointestinal: Negative.  Negative for heartburn, nausea and vomiting.  Musculoskeletal:  Positive for back pain, joint pain and myalgias.  Neurological:  Positive for sensory change and weakness. Negative for dizziness, focal weakness, seizures and headaches.  Psychiatric/Behavioral: Negative.  Negative for suicidal ideas.     Past Medical History:  Diagnosis Date   Hyperlipidemia    Hypertension    Nonischemic cardiomyopathy (HCC)    a. Echo 4/20220: LVEF of 40-45% (coronary CTA at the time showed coronary calcium score of 0 and normal coronaries) - felt to be due to uncontrolled hypertension   Obesity    OSA (obstructive sleep apnea)    Tobacco abuse     Past Surgical History:  Procedure Laterality Date   NO PAST SURGERIES     None      Family History  Problem Relation Age of Onset   Hypertension Mother    Hypertension Father    Diabetes Father     Social History Reviewed with no changes to be made today.   Outpatient Medications Prior to Visit  Medication Sig Dispense Refill   amLODipine (NORVASC) 10 MG tablet Take 1 tablet (10 mg total) by mouth daily. 90 tablet 1  carvedilol (COREG) 25 MG tablet Take 1 tablet (25 mg total) by mouth 2 (two) times daily with a meal. 180 tablet 1   chlorthalidone (HYGROTON) 25 MG tablet Take 1 tablet (25 mg total) by mouth daily. 90 tablet 1   hydrALAZINE (APRESOLINE) 25 MG tablet Take 1 tablet (25 mg total) by mouth 3 (three) times daily. 180 tablet 1   metFORMIN (GLUCOPHAGE) 500 MG tablet Take 1 tablet (500 mg total) by mouth 2 (two) times daily with a meal. 180 tablet 1   valsartan  (DIOVAN) 160 MG tablet Take 1 tablet (160 mg total) by mouth daily. 90 tablet 1   amoxicillin-clavulanate (AUGMENTIN) 875-125 MG tablet Take 1 tablet by mouth every 12 (twelve) hours. (Patient not taking: Reported on 04/14/2023) 14 tablet 0   No facility-administered medications prior to visit.    Allergies  Allergen Reactions   Bee Venom Anaphylaxis   Bee Venom    Shrimp (Diagnostic)    Shrimp [Shellfish Allergy] Itching       Objective:    BP 111/73 (BP Location: Left Arm, Patient Position: Sitting, Cuff Size: Large)   Pulse 81   Ht 5\' 11"  (1.803 m)   Wt (!) 318 lb (144.2 kg)   SpO2 98%   BMI 44.35 kg/m  Wt Readings from Last 3 Encounters:  04/14/23 (!) 318 lb (144.2 kg)  03/08/23 (!) 315 lb (142.9 kg)  01/13/23 (!) 316 lb 6.4 oz (143.5 kg)    Physical Exam Vitals and nursing note reviewed.  Constitutional:      Appearance: He is well-developed.  HENT:     Head: Normocephalic and atraumatic.  Cardiovascular:     Rate and Rhythm: Normal rate and regular rhythm.     Heart sounds: Normal heart sounds. No murmur heard.    No friction rub. No gallop.  Pulmonary:     Effort: Pulmonary effort is normal. No tachypnea or respiratory distress.     Breath sounds: Normal breath sounds. No decreased breath sounds, wheezing, rhonchi or rales.  Chest:     Chest wall: No tenderness.  Abdominal:     General: Bowel sounds are normal.     Palpations: Abdomen is soft.  Musculoskeletal:        General: Normal range of motion.     Cervical back: Normal range of motion.     Lumbar back: Negative right straight leg raise test and negative left straight leg raise test.  Skin:    General: Skin is warm and dry.  Neurological:     Mental Status: He is alert and oriented to person, place, and time.     Coordination: Coordination normal.  Psychiatric:        Behavior: Behavior normal. Behavior is cooperative.        Thought Content: Thought content normal.        Judgment: Judgment  normal.          Patient has been counseled extensively about nutrition and exercise as well as the importance of adherence with medications and regular follow-up. The patient was given clear instructions to go to ER or return to medical center if symptoms don't improve, worsen or new problems develop. The patient verbalized understanding.   Follow-up: Return in about 4 weeks (around 05/12/2023) for low back pain.   Claiborne Rigg, FNP-BC [facility] [place], [place] [phone]   04/14/2023, 2:55 PM

## 2023-04-15 LAB — HEMOGLOBIN A1C
Est. average glucose Bld gHb Est-mCnc: 120 mg/dL
Hgb A1c MFr Bld: 5.8 % — ABNORMAL HIGH (ref 4.8–5.6)

## 2023-04-24 ENCOUNTER — Other Ambulatory Visit: Payer: Self-pay

## 2023-05-08 ENCOUNTER — Ambulatory Visit (HOSPITAL_COMMUNITY)
Admission: EM | Admit: 2023-05-08 | Discharge: 2023-05-08 | Disposition: A | Discharge status: Home / Self Care | Payer: Medicaid Other | Attending: Internal Medicine | Admitting: Internal Medicine

## 2023-05-08 ENCOUNTER — Encounter (HOSPITAL_COMMUNITY): Payer: Self-pay | Admitting: Emergency Medicine

## 2023-05-08 ENCOUNTER — Other Ambulatory Visit: Payer: Self-pay

## 2023-05-08 DIAGNOSIS — J069 Acute upper respiratory infection, unspecified: Secondary | ICD-10-CM

## 2023-05-08 LAB — POC COVID19/FLU A&B COMBO
Covid Antigen, POC: NEGATIVE
Influenza A Antigen, POC: NEGATIVE
Influenza B Antigen, POC: NEGATIVE

## 2023-05-08 MED ORDER — FLUTICASONE PROPIONATE 50 MCG/ACT NA SUSP: 2.0000 | Freq: Every day | NASAL | 0 refills | Status: DC

## 2023-05-08 MED ORDER — BENZONATATE 200 MG PO CAPS: 200.0000 mg | ORAL_CAPSULE | Freq: Three times a day (TID) | ORAL | 0 refills | Status: DC | PRN

## 2023-05-08 NOTE — ED Triage Notes (Signed)
 Generalized body aches, running nose and cough started today.

## 2023-05-08 NOTE — ED Provider Notes (Signed)
 [facility]    CSN: [id number] Arrival date & time: 05/08/23  1840      History   Chief Complaint Chief Complaint  Patient presents with   URI    HPI Dale Mcintosh is a 38 y.o. male who presents with onset of body aches, rhinitis and cough since today. He denies fever or sweats. He could not work today and would like a noted.  Has not taken his second dose of his BP pressure   Past Medical History:  Diagnosis Date   Hyperlipidemia    Hypertension    Nonischemic cardiomyopathy (HCC)    a. Echo 4/20220: LVEF of 40-45% (coronary CTA at the time showed coronary calcium  score of 0 and normal coronaries) - felt to be due to uncontrolled hypertension   Obesity    OSA (obstructive sleep apnea)    Tobacco abuse     Patient Active Problem List   Diagnosis Date Noted   OSA (obstructive sleep apnea) 07/16/2022   Nonischemic cardiomyopathy (HCC)    Hypertension    Excessive daytime sleepiness 02/01/2017   Morbid obesity (HCC) 02/01/2017   Snoring 02/01/2017    Past Surgical History:  Procedure Laterality Date   NO PAST SURGERIES     None      Home Medications    Prior to Admission medications   Medication Sig Start Date End Date Taking? Authorizing Provider  benzonatate  (TESSALON ) 200 MG capsule Take 1 capsule (200 mg total) by mouth 3 (three) times daily as needed for cough. 05/08/23  Yes Rodriguez-Southworth, Destry Dauber, PA-C  fluticasone  (FLONASE ) 50 MCG/ACT nasal spray Place 2 sprays into both nostrils daily. 05/08/23  Yes Rodriguez-Southworth, Raeshaun Simson, PA-C  amLODipine  (NORVASC ) 10 MG tablet Take 1 tablet (10 mg total) by mouth daily. 01/13/23   Fleming, Zelda W, NP  carvedilol  (COREG ) 25 MG tablet Take 1 tablet (25 mg total) by mouth 2 (two) times daily with a meal. 01/13/23   Theotis Haze ORN, NP  chlorthalidone  (HYGROTON ) 25 MG tablet Take 1 tablet (25 mg total) by mouth daily. 01/13/23   Fleming, Zelda W, NP  hydrALAZINE  (APRESOLINE ) 25 MG tablet Take 1 tablet (25 mg  total) by mouth 3 (three) times daily. 01/13/23   Fleming, Zelda W, NP  meloxicam  (MOBIC ) 15 MG tablet Take 1 tablet (15 mg total) by mouth daily. 04/14/23   Fleming, Zelda W, NP  metFORMIN  (GLUCOPHAGE ) 500 MG tablet Take 1 tablet (500 mg total) by mouth 2 (two) times daily with a meal. 01/13/23   Theotis Haze ORN, NP  methocarbamol  (ROBAXIN ) 750 MG tablet Take 1 tablet (750 mg total) by mouth 4 (four) times daily. 04/14/23   Fleming, Zelda W, NP  valsartan  (DIOVAN ) 160 MG tablet Take 1 tablet (160 mg total) by mouth daily. 01/13/23   Fleming, Zelda W, NP  hydrochlorothiazide  (HYDRODIURIL ) 25 MG tablet Take 1 tablet (25 mg total) by mouth daily. 06/27/20 06/14/22  Lorren Greig PARAS, NP  losartan  (COZAAR ) 100 MG tablet Take 1 tablet (100 mg total) by mouth daily. 06/27/20 06/14/22  Lorren Greig PARAS, NP  nitroGLYCERIN  (NITROSTAT ) 0.4 MG SL tablet Place 1 tablet (0.4 mg total) under the tongue every 5 (five) minutes as needed for chest pain (per CT heart protocol). 08/29/18 06/14/22  Cheryle Page, MD    Family History Family History  Problem Relation Age of Onset   Hypertension Mother    Hypertension Father    Diabetes Father     Social History Social History  Tobacco Use   Smoking status: Every Day    Types: Cigarettes   Smokeless tobacco: Current  Vaping Use   Vaping status: Never Used  Substance Use Topics   Alcohol use: Never    Comment: occ   Drug use: Yes    Types: Marijuana    Comment: daily     Allergies   Bee venom, Bee venom, Shrimp (diagnostic), and Shrimp [shellfish allergy]   Review of Systems Review of Systems As noted in HPI  Physical Exam Triage Vital Signs ED Triage Vitals  Encounter Vitals Group     BP 05/08/23 1847 (!) 166/90     Systolic BP Percentile --      Diastolic BP Percentile --      Pulse Rate 05/08/23 1847 81     Resp 05/08/23 1847 18     Temp 05/08/23 1847 97.7 F (36.5 C)     Temp Source 05/08/23 1847 Oral     SpO2 05/08/23 1847 99 %     Weight --       Height --      Head Circumference --      Peak Flow --      Pain Score 05/08/23 1848 7     Pain Loc --      Pain Education --      Exclude from Growth Chart --    No data found.  Updated Vital Signs BP (!) 166/90 (BP Location: Right Arm)   Pulse 81   Temp 97.7 F (36.5 C) (Oral)   Resp 18   SpO2 99%   Visual Acuity Right Eye Distance:   Left Eye Distance:   Bilateral Distance:    Right Eye Near:   Left Eye Near:    Bilateral Near:     Physical Exam  Physical Exam Vitals signs and nursing note reviewed.  Constitutional:      General: he is not in acute distress.    Appearance: Normal appearance. He is not ill-appearing, toxic-appearing or diaphoretic.  HENT:     Head: Normocephalic.     Right Ear: Tympanic membrane, ear canal and external ear normal.     Left Ear: Tympanic membrane, ear canal and external ear normal.     Nose: moderate mucosa swelling with clear mucous    Mouth/Throat: clear    Mouth: Mucous membranes are moist.  Eyes:     General: No scleral icterus.       Right eye: No discharge.        Left eye: No discharge.     Conjunctiva/sclera: Conjunctivae normal.  Neck:     Musculoskeletal: Neck supple. No neck rigidity.  Cardiovascular:     Rate and Rhythm: Normal rate and regular rhythm.     Heart sounds: No murmur.  Pulmonary:     Effort: Pulmonary effort is normal.     Breath sounds: Normal breath sounds.  Abdominal:     General: Bowel sounds are normal. There is no distension.     Palpations: Abdomen is soft. There is no mass.     Tenderness: There is no abdominal tenderness. There is no guarding or rebound.     Hernia: No hernia is present.  Musculoskeletal: Normal range of motion.  Lymphadenopathy:     Cervical: No cervical adenopathy.  Skin:    General: Skin is warm and dry.     Coloration: Skin is not jaundiced.     Findings: No rash.  Neurological:  Mental Status: She is alert and oriented to person, place, and time.      Gait: Gait normal.  Psychiatric:        Mood and Affect: Mood normal.        Behavior: Behavior normal.        Thought Content: Thought content normal.        Judgment: Judgment normal.   UC Treatments / Results  Labs (all labs ordered are listed, but only abnormal results are displayed) Labs Reviewed  POC COVID19/FLU A&B COMBO    EKG   Radiology No results found.  Procedures Procedures (including critical care time)  Medications Ordered in UC Medications - No data to display  Initial Impression / Assessment and Plan / UC Course  I have reviewed the triage vital signs and the nursing notes.  Pertinent labs  results that were available during my care of the patient were reviewed by me and considered in my medical decision making (see chart for details).  URI with cough  I placed him on Tessalon  and Flonase  as noted.  If he develops a fever should get re-tested in case is too early today and his test are negative.    Final Clinical Impressions(s) / UC Diagnoses   Final diagnoses:  Upper respiratory tract infection, unspecified type   Discharge Instructions   None    ED Prescriptions     Medication Sig Dispense Auth. Provider   benzonatate  (TESSALON ) 200 MG capsule Take 1 capsule (200 mg total) by mouth 3 (three) times daily as needed for cough. 30 capsule Rodriguez-Southworth, Krystel Fletchall, PA-C   fluticasone  (FLONASE ) 50 MCG/ACT nasal spray Place 2 sprays into both nostrils daily. 15.8 g Rodriguez-Southworth, Kyra, PA-C      PDMP not reviewed this encounter.   Lindi Kyra, PA-C 05/08/23 1924

## 2023-06-04 ENCOUNTER — Ambulatory Visit: Payer: Self-pay | Attending: Nurse Practitioner | Admitting: Nurse Practitioner

## 2023-06-04 ENCOUNTER — Other Ambulatory Visit: Payer: Self-pay

## 2023-06-04 ENCOUNTER — Encounter: Payer: Self-pay | Admitting: Nurse Practitioner

## 2023-06-04 VITALS — BP 124/79 | HR 82 | Resp 20 | Ht 71.0 in | Wt 321.3 lb

## 2023-06-04 DIAGNOSIS — I428 Other cardiomyopathies: Secondary | ICD-10-CM

## 2023-06-04 DIAGNOSIS — I10 Essential (primary) hypertension: Secondary | ICD-10-CM

## 2023-06-04 DIAGNOSIS — M5410 Radiculopathy, site unspecified: Secondary | ICD-10-CM

## 2023-06-04 MED ORDER — MELOXICAM 15 MG PO TABS
15.0000 mg | ORAL_TABLET | Freq: Every day | ORAL | 1 refills | Status: DC
  Filled 2023-06-04: qty 30, 30d supply, fill #0

## 2023-06-04 MED ORDER — HYDRALAZINE HCL 25 MG PO TABS
25.0000 mg | ORAL_TABLET | Freq: Three times a day (TID) | ORAL | 1 refills | Status: DC
  Filled 2023-06-04: qty 180, 60d supply, fill #0
  Filled 2023-08-22 – 2023-09-02 (×2): qty 180, 60d supply, fill #1

## 2023-06-04 MED ORDER — METHOCARBAMOL 750 MG PO TABS
750.0000 mg | ORAL_TABLET | Freq: Four times a day (QID) | ORAL | 1 refills | Status: DC
Start: 2023-06-04 — End: 2023-12-23
  Filled 2023-06-04: qty 60, 15d supply, fill #0

## 2023-06-04 MED ORDER — VALSARTAN 160 MG PO TABS
160.0000 mg | ORAL_TABLET | Freq: Every day | ORAL | 1 refills | Status: DC
  Filled 2023-06-04: qty 90, 90d supply, fill #0
  Filled 2023-08-22 – 2023-09-02 (×2): qty 90, 90d supply, fill #1

## 2023-06-04 MED ORDER — CHLORTHALIDONE 25 MG PO TABS
25.0000 mg | ORAL_TABLET | Freq: Every day | ORAL | 1 refills | Status: DC
  Filled 2023-06-04: qty 90, 90d supply, fill #0
  Filled 2023-08-22 – 2023-09-02 (×3): qty 90, 90d supply, fill #1

## 2023-06-04 MED ORDER — CARVEDILOL 25 MG PO TABS
25.0000 mg | ORAL_TABLET | Freq: Two times a day (BID) | ORAL | 1 refills | Status: DC
  Filled 2023-06-04: qty 180, 90d supply, fill #0
  Filled 2023-08-22 – 2023-09-02 (×3): qty 180, 90d supply, fill #1

## 2023-06-04 MED ORDER — AMLODIPINE BESYLATE 10 MG PO TABS
10.0000 mg | ORAL_TABLET | Freq: Every day | ORAL | 1 refills | Status: DC
Start: 2023-06-04 — End: 2024-02-12
  Filled 2023-06-04: qty 90, 90d supply, fill #0
  Filled 2023-08-22 – 2023-09-02 (×4): qty 90, 90d supply, fill #1

## 2023-06-04 NOTE — Progress Notes (Signed)
Assessment & Plan:  Dale Mcintosh was seen today for back pain.  Diagnoses and all orders for this visit:  Back pain with right-sided radiculopathy -     meloxicam (MOBIC) 15 MG tablet; Take 1 tablet (15 mg total) by mouth daily. -     methocarbamol (ROBAXIN) 750 MG tablet; Take 1 tablet (750 mg total) by mouth 4 (four) times daily. Work on losing weight to help reduce back pain. May alternate with heat and ice application for pain relief. May also alternate with acetaminophen and Ibuprofen as prescribed for back pain. Other alternatives include massage, acupuncture and water aerobics.  You must stay active and avoid a sedentary lifestyle.    Primary hypertension -     valsartan (DIOVAN) 160 MG tablet; Take 1 tablet (160 mg total) by mouth daily. -     chlorthalidone (HYGROTON) 25 MG tablet; Take 1 tablet (25 mg total) by mouth daily. -     hydrALAZINE (APRESOLINE) 25 MG tablet; Take 1 tablet (25 mg total) by mouth 3 (three) times daily. -     carvedilol (COREG) 25 MG tablet; Take 1 tablet (25 mg total) by mouth 2 (two) times daily with a meal. -     amLODipine (NORVASC) 10 MG tablet; Take 1 tablet (10 mg total) by mouth daily. -     CMP14+EGFR Continue all antihypertensives as prescribed.  Reminded to bring in blood pressure log for follow  up appointment.  RECOMMENDATIONS: DASH/Mediterranean Diets are healthier choices for HTN.    Non-ischemic cardiomyopathy (HCC) -     hydrALAZINE (APRESOLINE) 25 MG tablet; Take 1 tablet (25 mg total) by mouth 3 (three) times daily.    Patient has been counseled on age-appropriate routine health concerns for screening and prevention. These are reviewed and up-to-date. Referrals have been placed accordingly. Immunizations are up-to-date or declined.    Subjective:   Chief Complaint  Patient presents with   Back Pain    No improvement.     Dale Mcintosh 38 y.o. male presents to office today for follow up to HTN and back pain.  Back Pain: Symptoms  have been present for several months and include numbness in RLE, weakness in RLE, and pain in RLE . Initial inciting event: none. Symptoms are worst: at work. Alleviating factors identifiable by patient are recumbency and sitting. Exacerbating factors identifiable by patient are bending forwards, bending sideways, standing, and walking. Treatments so far initiated by patient:  OCT NSAIDs  Previous lower back problems: none. Previous workup: none. Previous treatments: none.  Has been taking ibuprofen OTC with no relief of pain. I prescribed meloxicam and methocarbamol at his last visit in December and he still has not picked these up.  Pain described as sharp. Works at [employer].  He declined toradol injection today   HTN Blood pressure is well controlled.  BP Readings from Last 3 Encounters:  06/04/23 124/79  05/08/23 (!) 166/90  04/14/23 111/73     Review of Systems  Constitutional:  Negative for fever, malaise/fatigue and weight loss.  HENT: Negative.  Negative for nosebleeds.   Eyes: Negative.  Negative for blurred vision, double vision and photophobia.  Respiratory: Negative.  Negative for cough and shortness of breath.   Cardiovascular: Negative.  Negative for chest pain, palpitations and leg swelling.  Gastrointestinal: Negative.  Negative for heartburn, nausea and vomiting.  Musculoskeletal:  Positive for back pain and myalgias.  Neurological: Negative.  Negative for dizziness, focal weakness, seizures and headaches.  Psychiatric/Behavioral: Negative.  Negative for suicidal ideas.     Past Medical History:  Diagnosis Date   Hyperlipidemia    Hypertension    Nonischemic cardiomyopathy (HCC)    a. Echo 4/20220: LVEF of 40-45% (coronary CTA at the time showed coronary calcium score of 0 and normal coronaries) - felt to be due to uncontrolled hypertension   Obesity    OSA (obstructive sleep apnea)    Tobacco abuse     Past Surgical History:  Procedure Laterality Date   NO  PAST SURGERIES     None      Family History  Problem Relation Age of Onset   Hypertension Mother    Hypertension Father    Diabetes Father     Social History Reviewed with no changes to be made today.   Outpatient Medications Prior to Visit  Medication Sig Dispense Refill   metFORMIN (GLUCOPHAGE) 500 MG tablet Take 1 tablet (500 mg total) by mouth 2 (two) times daily with a meal. 180 tablet 1   amLODipine (NORVASC) 10 MG tablet Take 1 tablet (10 mg total) by mouth daily. 90 tablet 1   carvedilol (COREG) 25 MG tablet Take 1 tablet (25 mg total) by mouth 2 (two) times daily with a meal. 180 tablet 1   chlorthalidone (HYGROTON) 25 MG tablet Take 1 tablet (25 mg total) by mouth daily. 90 tablet 1   hydrALAZINE (APRESOLINE) 25 MG tablet Take 1 tablet (25 mg total) by mouth 3 (three) times daily. 180 tablet 1   valsartan (DIOVAN) 160 MG tablet Take 1 tablet (160 mg total) by mouth daily. 90 tablet 1   benzonatate (TESSALON) 200 MG capsule Take 1 capsule (200 mg total) by mouth 3 (three) times daily as needed for cough. (Patient not taking: Reported on 06/04/2023) 30 capsule 0   fluticasone (FLONASE) 50 MCG/ACT nasal spray Place 2 sprays into both nostrils daily. (Patient not taking: Reported on 06/04/2023) 15.8 g 0   meloxicam (MOBIC) 15 MG tablet Take 1 tablet (15 mg total) by mouth daily. (Patient not taking: Reported on 06/04/2023) 30 tablet 1   methocarbamol (ROBAXIN) 750 MG tablet Take 1 tablet (750 mg total) by mouth 4 (four) times daily. (Patient not taking: Reported on 06/04/2023) 60 tablet 1   No facility-administered medications prior to visit.    Allergies  Allergen Reactions   Bee Venom Anaphylaxis   Bee Venom    Shrimp (Diagnostic)    Shrimp [Shellfish Allergy] Itching       Objective:    BP 124/79 (BP Location: Left Arm, Patient Position: Sitting, Cuff Size: Large)   Pulse 82   Resp 20   Ht 5\' 11"  (1.803 m)   Wt (!) 321 lb 4.8 oz (145.7 kg)   SpO2 98%   BMI 44.81  kg/m  Wt Readings from Last 3 Encounters:  06/04/23 (!) 321 lb 4.8 oz (145.7 kg)  04/14/23 (!) 318 lb (144.2 kg)  03/08/23 (!) 315 lb (142.9 kg)    Physical Exam Vitals and nursing note reviewed.  Constitutional:      Appearance: He is well-developed.  HENT:     Head: Normocephalic and atraumatic.  Cardiovascular:     Rate and Rhythm: Normal rate and regular rhythm.     Heart sounds: Normal heart sounds. No murmur heard.    No friction rub. No gallop.  Pulmonary:     Effort: Pulmonary effort is normal. No tachypnea or respiratory distress.     Breath sounds: Normal breath sounds. No  decreased breath sounds, wheezing, rhonchi or rales.  Chest:     Chest wall: No tenderness.  Abdominal:     General: Bowel sounds are normal.     Palpations: Abdomen is soft.  Musculoskeletal:        General: Normal range of motion.     Cervical back: Normal range of motion.  Skin:    General: Skin is warm and dry.  Neurological:     Mental Status: He is alert and oriented to person, place, and time.     Coordination: Coordination normal.  Psychiatric:        Behavior: Behavior normal. Behavior is cooperative.        Thought Content: Thought content normal.        Judgment: Judgment normal.          Patient has been counseled extensively about nutrition and exercise as well as the importance of adherence with medications and regular follow-up. The patient was given clear instructions to go to ER or return to medical center if symptoms don't improve, worsen or new problems develop. The patient verbalized understanding.   Follow-up: Return in about 3 months (around 09/02/2023).   Claiborne Rigg, FNP-BC [facility] [place], [place] [phone]   06/04/2023, 4:26 PM

## 2023-06-06 ENCOUNTER — Other Ambulatory Visit: Payer: Self-pay

## 2023-06-07 ENCOUNTER — Ambulatory Visit (HOSPITAL_COMMUNITY)
Admission: EM | Admit: 2023-06-07 | Discharge: 2023-06-07 | Disposition: A | Discharge status: Home / Self Care | Payer: Medicaid Other | Attending: Neurology | Admitting: Neurology

## 2023-06-07 ENCOUNTER — Encounter (HOSPITAL_COMMUNITY): Payer: Self-pay

## 2023-06-07 DIAGNOSIS — R319 Hematuria, unspecified: Secondary | ICD-10-CM | POA: Insufficient documentation

## 2023-06-07 LAB — POCT URINALYSIS DIP (MANUAL ENTRY)
Blood, UA: NEGATIVE
Glucose, UA: NEGATIVE mg/dL
Ketones, POC UA: NEGATIVE mg/dL
Leukocytes, UA: NEGATIVE
Nitrite, UA: NEGATIVE
Protein Ur, POC: 30 mg/dL — AB
Spec Grav, UA: >=1.03 — AB (ref 1.010–1.025)
Urobilinogen, UA: 1 U/dL
pH, UA: 5.5 (ref 5.0–8.0)

## 2023-06-07 NOTE — Discharge Instructions (Signed)
Please follow-up with urology if your symptoms recur.  At this time your urine does not show any hemoglobin or red blood cells. Follow up with PCP and urology as needed.  We will follow-up with your STI testing if anything is abnormal and provide appropriate treatment.

## 2023-06-07 NOTE — ED Provider Notes (Signed)
[facility]    CSN: [id number] Arrival date & time: 06/07/23  1304      History   Chief Complaint Chief Complaint  Patient presents with   Hematuria    HPI Dale Mcintosh is a 38 y.o. male.   Noticed red drops in his urine this morning. No pain with urination. No sores, lacerations, no known STI exposure but would like testing while he is here.  He has had this happen in the past but it has not happened in approximately a month.  The time before that was about 2 months ago.  He denies pain with urination unless he holds his urine for too long.  He is sexually active. Urine provided in clinic with no hemoglobin.  Patient is willing to follow-up with his PCP and urology   Hematuria    Past Medical History:  Diagnosis Date   Hyperlipidemia    Hypertension    Nonischemic cardiomyopathy (HCC)    a. Echo 4/20220: LVEF of 40-45% (coronary CTA at the time showed coronary calcium score of 0 and normal coronaries) - felt to be due to uncontrolled hypertension   Obesity    OSA (obstructive sleep apnea)    Tobacco abuse     Patient Active Problem List   Diagnosis Date Noted   OSA (obstructive sleep apnea) 07/16/2022   Nonischemic cardiomyopathy (HCC)    Hypertension    Excessive daytime sleepiness 02/01/2017   Morbid obesity (HCC) 02/01/2017   Snoring 02/01/2017    Past Surgical History:  Procedure Laterality Date   NO PAST SURGERIES     None         Home Medications    Prior to Admission medications   Medication Sig Start Date End Date Taking? Authorizing Provider  amLODipine (NORVASC) 10 MG tablet Take 1 tablet (10 mg total) by mouth daily. 06/04/23   Claiborne Rigg, NP  carvedilol (COREG) 25 MG tablet Take 1 tablet (25 mg total) by mouth 2 (two) times daily with a meal. 06/04/23   Claiborne Rigg, NP  chlorthalidone (HYGROTON) 25 MG tablet Take 1 tablet (25 mg total) by mouth daily. 06/04/23   Claiborne Rigg, NP  hydrALAZINE (APRESOLINE) 25 MG tablet  Take 1 tablet (25 mg total) by mouth 3 (three) times daily. 06/04/23   Claiborne Rigg, NP  meloxicam (MOBIC) 15 MG tablet Take 1 tablet (15 mg total) by mouth daily. 06/04/23   Claiborne Rigg, NP  metFORMIN (GLUCOPHAGE) 500 MG tablet Take 1 tablet (500 mg total) by mouth 2 (two) times daily with a meal. 01/13/23   Claiborne Rigg, NP  methocarbamol (ROBAXIN) 750 MG tablet Take 1 tablet (750 mg total) by mouth 4 (four) times daily. 06/04/23   Claiborne Rigg, NP  valsartan (DIOVAN) 160 MG tablet Take 1 tablet (160 mg total) by mouth daily. 06/04/23   Claiborne Rigg, NP  hydrochlorothiazide (HYDRODIURIL) 25 MG tablet Take 1 tablet (25 mg total) by mouth daily. 06/27/20 06/14/22  Rema Fendt, NP  losartan (COZAAR) 100 MG tablet Take 1 tablet (100 mg total) by mouth daily. 06/27/20 06/14/22  Rema Fendt, NP  nitroGLYCERIN (NITROSTAT) 0.4 MG SL tablet Place 1 tablet (0.4 mg total) under the tongue every 5 (five) minutes as needed for chest pain (per CT heart protocol). 08/29/18 06/14/22  Glade Lloyd, MD    Family History Family History  Problem Relation Age of Onset   Hypertension Mother    Hypertension Father  Diabetes Father     Social History Social History   Tobacco Use   Smoking status: Every Day    Types: Cigarettes   Smokeless tobacco: Current  Vaping Use   Vaping status: Never Used  Substance Use Topics   Alcohol use: Never    Comment: occ   Drug use: Yes    Types: Marijuana    Comment: daily     Allergies   Bee venom, Bee venom, Shrimp (diagnostic), and Shrimp [shellfish allergy]   Review of Systems Review of Systems  Genitourinary:  Positive for hematuria.     Physical Exam Triage Vital Signs ED Triage Vitals  Encounter Vitals Group     BP 06/07/23 1355 (!) 151/95     Systolic BP Percentile --      Diastolic BP Percentile --      Pulse Rate 06/07/23 1355 78     Resp 06/07/23 1355 19     Temp 06/07/23 1355 98.6 F (37 C)     Temp Source 06/07/23 1355  Oral     SpO2 06/07/23 1355 96 %     Weight 06/07/23 1357 (!) 321 lb (145.6 kg)     Height 06/07/23 1357 5\' 11"  (1.803 m)     Head Circumference --      Peak Flow --      Pain Score 06/07/23 1357 0     Pain Loc --      Pain Education --      Exclude from Growth Chart --    No data found.  Updated Vital Signs BP (!) 151/95 (BP Location: Left Arm)   Pulse 78   Temp 98.6 F (37 C) (Oral)   Resp 19   Ht 5\' 11"  (1.803 m)   Wt (!) 321 lb (145.6 kg)   SpO2 96%   BMI 44.77 kg/m   Visual Acuity Right Eye Distance:   Left Eye Distance:   Bilateral Distance:    Right Eye Near:   Left Eye Near:    Bilateral Near:     Physical Exam Vitals and nursing note reviewed.  Constitutional:      General: He is not in acute distress.    Appearance: He is well-developed. He is obese.  HENT:     Head: Normocephalic and atraumatic.  Eyes:     Conjunctiva/sclera: Conjunctivae normal.  Cardiovascular:     Rate and Rhythm: Normal rate and regular rhythm.  Pulmonary:     Effort: Pulmonary effort is normal.  Abdominal:     Palpations: Abdomen is soft.     Tenderness: There is no abdominal tenderness.  Musculoskeletal:        General: No swelling.     Cervical back: Neck supple.  Skin:    General: Skin is warm and dry.     Capillary Refill: Capillary refill takes less than 2 seconds.  Neurological:     Mental Status: He is alert.  Psychiatric:        Mood and Affect: Mood normal.      UC Treatments / Results  Labs (all labs ordered are listed, but only abnormal results are displayed) Labs Reviewed  POCT URINALYSIS DIP (MANUAL ENTRY) - Abnormal; Notable for the following components:      Result Value   Bilirubin, UA small (*)    Spec Grav, UA >=1.030 (*)    Protein Ur, POC =30 (*)    All other components within normal limits  CYTOLOGY, (ORAL, ANAL, URETHRAL) ANCILLARY  ONLY    EKG   Radiology No results found.  Procedures Procedures (including critical care  time)  Medications Ordered in UC Medications - No data to display  Initial Impression / Assessment and Plan / UC Course  I have reviewed the triage vital signs and the nursing notes.  Pertinent labs & imaging results that were available during my care of the patient were reviewed by me and considered in my medical decision making (see chart for details).  Urinalysis done in clinic with no hemoglobin or red blood cells.  Provided information for PCP and urology follow-up as needed.  He is not currently having any dysuria.       Final Clinical Impressions(s) / UC Diagnoses   Final diagnoses:  Hematuria, unspecified type     Discharge Instructions      Please follow-up with urology if your symptoms recur.  At this time your urine does not show any hemoglobin or red blood cells. Follow up with PCP and urology as needed.  We will follow-up with your STI testing if anything is abnormal and provide appropriate treatment.     ED Prescriptions   None    PDMP not reviewed this encounter.   Elmer Picker, NP 06/07/23 1455

## 2023-06-07 NOTE — ED Triage Notes (Signed)
Patient here today with c/o blood in urine off and on over the past 2 months. Denies pain.

## 2023-06-09 ENCOUNTER — Encounter (HOSPITAL_COMMUNITY): Payer: Self-pay

## 2023-06-09 ENCOUNTER — Ambulatory Visit (HOSPITAL_COMMUNITY)
Admission: EM | Admit: 2023-06-09 | Discharge: 2023-06-09 | Disposition: A | Discharge status: Home / Self Care | Payer: Self-pay | Attending: Family Medicine | Admitting: Family Medicine

## 2023-06-09 DIAGNOSIS — M549 Dorsalgia, unspecified: Secondary | ICD-10-CM

## 2023-06-09 LAB — CYTOLOGY, (ORAL, ANAL, URETHRAL) ANCILLARY ONLY
Chlamydia: NEGATIVE
Comment: NEGATIVE
Comment: NEGATIVE
Comment: NORMAL
Neisseria Gonorrhea: NEGATIVE
Trichomonas: NEGATIVE

## 2023-06-09 MED ORDER — IBUPROFEN 800 MG PO TABS: 800.0000 mg | ORAL_TABLET | Freq: Three times a day (TID) | ORAL | 0 refills | Status: DC | PRN

## 2023-06-09 MED ORDER — TIZANIDINE HCL 4 MG PO TABS: 4.0000 mg | ORAL_TABLET | Freq: Three times a day (TID) | ORAL | 0 refills | Status: DC | PRN

## 2023-06-09 NOTE — Discharge Instructions (Addendum)
 Take ibuprofen 800 mg--1 tab every 8 hours as needed for pain.   Take tizanidine 4 mg--1 every 8 hours as needed for muscle spasms; this medication can cause dizziness and sleepiness
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
## 2023-08-22 ENCOUNTER — Other Ambulatory Visit: Payer: Self-pay

## 2023-09-02 ENCOUNTER — Other Ambulatory Visit: Payer: Self-pay

## 2023-11-16 ENCOUNTER — Encounter (HOSPITAL_COMMUNITY): Payer: Self-pay | Admitting: *Deleted

## 2023-11-16 ENCOUNTER — Ambulatory Visit (HOSPITAL_COMMUNITY)
Admission: EM | Admit: 2023-11-16 | Discharge: 2023-11-16 | Disposition: A | Discharge status: Home / Self Care | Attending: Emergency Medicine | Admitting: Emergency Medicine

## 2023-11-16 DIAGNOSIS — I10 Essential (primary) hypertension: Secondary | ICD-10-CM

## 2023-11-16 DIAGNOSIS — M545 Low back pain, unspecified: Secondary | ICD-10-CM

## 2023-11-16 DIAGNOSIS — R319 Hematuria, unspecified: Secondary | ICD-10-CM

## 2023-11-16 DIAGNOSIS — W19XXXA Unspecified fall, initial encounter: Secondary | ICD-10-CM

## 2023-11-16 LAB — POCT URINALYSIS DIP (MANUAL ENTRY)
Bilirubin, UA: NEGATIVE
Blood, UA: NEGATIVE
Glucose, UA: NEGATIVE mg/dL
Ketones, POC UA: NEGATIVE mg/dL
Leukocytes, UA: NEGATIVE
Nitrite, UA: NEGATIVE
Spec Grav, UA: >=1.03 — AB (ref 1.010–1.025)
Urobilinogen, UA: 0.2 U/dL
pH, UA: 5.5 (ref 5.0–8.0)

## 2023-11-16 MED ORDER — LIDOCAINE 4 % EX PTCH: 1.0000 | MEDICATED_PATCH | CUTANEOUS | 0 refills | Status: DC

## 2023-11-16 NOTE — ED Provider Notes (Signed)
 [facility]    CSN: [id number] Arrival date & time: 11/16/23  1709      History   Chief Complaint Chief Complaint  Patient presents with   Fall   Hematuria    HPI Dale Mcintosh is a 38 y.o. male.   Patient presents with low back pain after fall that occurred around 2 PM today.  Patient states that he slipped and fell while walking down the stairs and hit his back directly on a step.  Patient states an hour later he urinated blood.  Patient states that he has only urinated once since this incident.  Patient states that he noticed a small amount of blood in his urine.  Denies noticing blood clots or continuous bleeding.  Patient denies hitting his head or loss of consciousness.  Patient denies any other injuries from this fall. Denies urinary frequency/urgency, dysuria, abdominal pain, penile discharge, penile/testicular pain or swelling, or fever.  Of note patient's blood pressure is elevated in clinic today.  Patient states that he did not take his blood pressure medication today.  Patient states he did not run out of his blood pressure medication, just states that he did not take it today.  Patient denies any symptoms related to this.  The history is provided by the patient and medical records.  Fall  Hematuria    Past Medical History:  Diagnosis Date   Hyperlipidemia    Hypertension    Nonischemic cardiomyopathy (HCC)    a. Echo 4/20220: LVEF of 40-45% (coronary CTA at the time showed coronary calcium  score of 0 and normal coronaries) - felt to be due to uncontrolled hypertension   Obesity    OSA (obstructive sleep apnea)    Tobacco abuse     Patient Active Problem List   Diagnosis Date Noted   OSA (obstructive sleep apnea) 07/16/2022   Nonischemic cardiomyopathy (HCC)    Hypertension    Excessive daytime sleepiness 02/01/2017   Morbid obesity (HCC) 02/01/2017   Snoring 02/01/2017    Past Surgical History:  Procedure Laterality Date   NO PAST  SURGERIES     None         Home Medications    Prior to Admission medications   Medication Sig Start Date End Date Taking? Authorizing Provider  amLODipine  (NORVASC ) 10 MG tablet Take 1 tablet (10 mg total) by mouth daily. 06/04/23  Yes Theotis Haze ORN, NP  carvedilol  (COREG ) 25 MG tablet Take 1 tablet (25 mg total) by mouth 2 (two) times daily with a meal. 06/04/23  Yes Theotis Haze ORN, NP  chlorthalidone  (HYGROTON ) 25 MG tablet Take 1 tablet (25 mg total) by mouth daily. 06/04/23  Yes Theotis Haze ORN, NP  lidocaine  4 % Place 1 patch onto the skin daily. 11/16/23  Yes Johnie Flaming A, NP  metFORMIN  (GLUCOPHAGE ) 500 MG tablet Take 1 tablet (500 mg total) by mouth 2 (two) times daily with a meal. 01/13/23  Yes Fleming, Zelda W, NP  valsartan  (DIOVAN ) 160 MG tablet Take 1 tablet (160 mg total) by mouth daily. 06/04/23  Yes Theotis Haze ORN, NP  hydrALAZINE  (APRESOLINE ) 25 MG tablet Take 1 tablet (25 mg total) by mouth 3 (three) times daily. 06/04/23   Fleming, Zelda W, NP  ibuprofen  (ADVIL ) 800 MG tablet Take 1 tablet (800 mg total) by mouth every 8 (eight) hours as needed (pain). 06/09/23   Vonna Sharlet POUR, MD  meloxicam  (MOBIC ) 15 MG tablet Take 1 tablet (15 mg total) by mouth  daily. 06/04/23   Fleming, Zelda W, NP  methocarbamol  (ROBAXIN ) 750 MG tablet Take 1 tablet (750 mg total) by mouth 4 (four) times daily. 06/04/23   Fleming, Zelda W, NP  tiZANidine  (ZANAFLEX ) 4 MG tablet Take 1 tablet (4 mg total) by mouth every 8 (eight) hours as needed for muscle spasms. 06/09/23   Vonna Sharlet POUR, MD  hydrochlorothiazide  (HYDRODIURIL ) 25 MG tablet Take 1 tablet (25 mg total) by mouth daily. 06/27/20 06/14/22  Lorren Greig PARAS, NP  losartan  (COZAAR ) 100 MG tablet Take 1 tablet (100 mg total) by mouth daily. 06/27/20 06/14/22  Lorren Greig PARAS, NP  nitroGLYCERIN  (NITROSTAT ) 0.4 MG SL tablet Place 1 tablet (0.4 mg total) under the tongue every 5 (five) minutes as needed for chest pain (per CT heart protocol).  08/29/18 06/14/22  Cheryle Page, MD    Family History Family History  Problem Relation Age of Onset   Hypertension Mother    Hypertension Father    Diabetes Father     Social History Social History   Tobacco Use   Smoking status: Every Day    Types: Cigarettes   Smokeless tobacco: Current  Vaping Use   Vaping status: Never Used  Substance Use Topics   Alcohol use: Never    Comment: occ   Drug use: Yes    Types: Marijuana    Comment: daily     Allergies   Bee venom, Bee venom, Shrimp (diagnostic), and Shrimp [shellfish allergy]   Review of Systems Review of Systems  Genitourinary:  Positive for hematuria.   Per HPI  Physical Exam Triage Vital Signs ED Triage Vitals  Encounter Vitals Group     BP 11/16/23 1725 (!) 158/103     Girls Systolic BP Percentile --      Girls Diastolic BP Percentile --      Boys Systolic BP Percentile --      Boys Diastolic BP Percentile --      Pulse Rate 11/16/23 1725 84     Resp 11/16/23 1725 20     Temp 11/16/23 1725 98.3 F (36.8 C)     Temp Source 11/16/23 1725 Oral     SpO2 11/16/23 1725 96 %     Weight --      Height --      Head Circumference --      Peak Flow --      Pain Score 11/16/23 1724 7     Pain Loc --      Pain Education --      Exclude from Growth Chart --    No data found.  Updated Vital Signs BP (!) 158/103 (BP Location: Left Arm)   Pulse 84   Temp 98.3 F (36.8 C) (Oral)   Resp 20   SpO2 96%   Visual Acuity Right Eye Distance:   Left Eye Distance:   Bilateral Distance:    Right Eye Near:   Left Eye Near:    Bilateral Near:     Physical Exam Vitals and nursing note reviewed.  Constitutional:      General: He is awake. He is not in acute distress.    Appearance: Normal appearance. He is well-developed and well-groomed. He is not ill-appearing.  Abdominal:     General: There is no distension.     Palpations: Abdomen is soft.     Tenderness: There is no abdominal tenderness. There is no  right CVA tenderness, left CVA tenderness, guarding or rebound.  Musculoskeletal:  Cervical back: Normal.     Thoracic back: Normal.     Lumbar back: Tenderness present. No swelling, edema, deformity, signs of trauma, lacerations or bony tenderness. Normal range of motion. Negative right straight leg raise test and negative left straight leg raise test.       Back:     Comments: Tenderness noted to bilateral low back without spinous process tenderness.  Skin:    General: Skin is warm and dry.  Neurological:     Mental Status: He is alert.  Psychiatric:        Behavior: Behavior is cooperative.      UC Treatments / Results  Labs (all labs ordered are listed, but only abnormal results are displayed) Labs Reviewed  POCT URINALYSIS DIP (MANUAL ENTRY) - Abnormal; Notable for the following components:      Result Value   Spec Grav, UA >=1.030 (*)    Protein Ur, POC trace (*)    All other components within normal limits    EKG   Radiology No results found.  Procedures Procedures (including critical care time)  Medications Ordered in UC Medications - No data to display  Initial Impression / Assessment and Plan / UC Course  I have reviewed the triage vital signs and the nursing notes.  Pertinent labs & imaging results that were available during my care of the patient were reviewed by me and considered in my medical decision making (see chart for details).     Patient is overall well-appearing.  Vitals are stable.  Blood pressure is elevated at 158/103.  Patient states that he did not take his blood pressure medication today.  Without CVA tenderness upon exam.  There is muscular tenderness noted to bilateral low back without spinous process tenderness.  Urinalysis did not reveal any blood at this time.  Symptoms likely muscular in nature.  Prescribed lidocaine  patches as needed for pain.  Recommended Tylenol  as needed for pain.  Given orthopedic follow-up if needed.   Discussed importance of taking blood pressure medication daily.  Discussed follow-up and return precautions. Final Clinical Impressions(s) / UC Diagnoses   Final diagnoses:  Fall, initial encounter  Hematuria, unspecified type  Acute bilateral low back pain without sciatica  Elevated blood pressure reading in office with diagnosis of hypertension     Discharge Instructions      As discussed I believe your pain is likely muscular in nature.  There is no blood in your urine and therefore I do not believe that this is related to a kidney injury from your fall. You can apply lidocaine  patch for 12 hours at a time once daily to help with your pain. Otherwise you can take 650 mg of Tylenol  every 6-8 hours as needed for pain. Alternate between ice and heat as needed for pain. Follow-up with [facility] if your pain continues for further evaluation and management. Be sure you are taking your blood pressure medication daily. Follow-up with your primary care provider or return here as needed.     ED Prescriptions     Medication Sig Dispense Auth. Provider   lidocaine  4 % Place 1 patch onto the skin daily. 20 patch Johnie Flaming A, NP      PDMP not reviewed this encounter.   Johnie Flaming A, NP 11/16/23 [id number]

## 2023-11-16 NOTE — ED Triage Notes (Addendum)
 Pt states that he fell today at 2pm down his steps hitting his back and an hour later he urinated blood.

## 2023-11-16 NOTE — Discharge Instructions (Addendum)
 As discussed I believe your pain is likely muscular in nature.  There is no blood in your urine and therefore I do not believe that this is related to a kidney injury from your fall. You can apply lidocaine  patch for 12 hours at a time once daily to help with your pain. Otherwise you can take 650 mg of Tylenol  every 6-8 hours as needed for pain. Alternate between ice and heat as needed for pain. Follow-up with [facility] if your pain continues for further evaluation and management. Be sure you are taking your blood pressure medication daily. Follow-up with your primary care provider or return here as needed.

## 2023-12-23 ENCOUNTER — Ambulatory Visit (HOSPITAL_COMMUNITY)
Admission: EM | Admit: 2023-12-23 | Discharge: 2023-12-23 | Disposition: A | Discharge status: Home / Self Care | Attending: Family Medicine | Admitting: Family Medicine

## 2023-12-23 ENCOUNTER — Other Ambulatory Visit: Payer: Self-pay

## 2023-12-23 ENCOUNTER — Encounter (HOSPITAL_COMMUNITY): Payer: Self-pay | Admitting: Emergency Medicine

## 2023-12-23 DIAGNOSIS — I10 Essential (primary) hypertension: Secondary | ICD-10-CM

## 2023-12-23 DIAGNOSIS — S39012A Strain of muscle, fascia and tendon of lower back, initial encounter: Secondary | ICD-10-CM

## 2023-12-23 MED ORDER — CYCLOBENZAPRINE HCL 10 MG PO TABS: ORAL_TABLET | ORAL | 0 refills | Status: DC

## 2023-12-23 NOTE — Discharge Instructions (Signed)
 Your blood pressure was noted to be elevated during your visit today. If you are currently taking medication for high blood pressure, please ensure you are taking this as directed. If you do not have a history of high blood pressure and your blood pressure remains persistently elevated, you may need to begin taking a medication at some point. You may return here within the next few days to recheck if unable to see your primary care provider or if you do not have a one.  BP (!) 164/111 (BP Location: Left Arm) Comment (BP Location): large cuff  Pulse 85   Temp 98.4 F (36.9 C) (Oral)   Resp 20   SpO2 96%   BP Readings from Last 3 Encounters:  12/23/23 (!) 164/111  11/16/23 (!) 158/103  06/09/23 (!) 148/93
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
## 2024-02-10 NOTE — Progress Notes (Unsigned)
   Established Patient Office Visit  Subjective   Patient ID: Zarian Colpitts, male    DOB: [birth year]  Age: 38 y.o. MRN: [id number]  No chief complaint on file.   PCP fleming last seen 05/2023  38 y.o.M HTN  NICM OSA wt 321    {History (Optional):23778}  Review of Systems  Constitutional:  Positive for malaise/fatigue.  Respiratory: Negative.    Cardiovascular:  Positive for chest pain and orthopnea. Negative for palpitations and leg swelling.  Neurological:  Positive for headaches.  Psychiatric/Behavioral:  Positive for depression.       Objective:     There were no vitals taken for this visit. {Vitals History (Optional):23777}  Physical Exam   No results found for any visits on 02/12/24.  {Labs (Optional):23779}  The ASCVD Risk score (Arnett DK, et al., 2019) failed to calculate for the following reasons:   The 2019 ASCVD risk score is only valid for ages 33 to 58    Assessment & Plan:   Problem List Items Addressed This Visit   None   No follow-ups on file.    Belvie Silvan, MD

## 2024-02-12 ENCOUNTER — Ambulatory Visit: Attending: Critical Care Medicine | Admitting: Critical Care Medicine

## 2024-02-12 ENCOUNTER — Encounter: Payer: Self-pay | Admitting: Critical Care Medicine

## 2024-02-12 ENCOUNTER — Other Ambulatory Visit: Payer: Self-pay

## 2024-02-12 VITALS — BP 168/118 | HR 78 | Ht 71.0 in | Wt 331.4 lb

## 2024-02-12 DIAGNOSIS — I10 Essential (primary) hypertension: Secondary | ICD-10-CM | POA: Diagnosis not present

## 2024-02-12 DIAGNOSIS — I428 Other cardiomyopathies: Secondary | ICD-10-CM

## 2024-02-12 DIAGNOSIS — G4733 Obstructive sleep apnea (adult) (pediatric): Secondary | ICD-10-CM

## 2024-02-12 DIAGNOSIS — R7303 Prediabetes: Secondary | ICD-10-CM | POA: Diagnosis not present

## 2024-02-12 DIAGNOSIS — F331 Major depressive disorder, recurrent, moderate: Secondary | ICD-10-CM

## 2024-02-12 MED ORDER — CARVEDILOL 25 MG PO TABS
25.0000 mg | ORAL_TABLET | Freq: Two times a day (BID) | ORAL | 1 refills | Status: AC
  Filled 2024-02-12: qty 180, 90d supply, fill #0
  Filled 2024-05-14: qty 180, 90d supply, fill #1

## 2024-02-12 MED ORDER — METFORMIN HCL 500 MG PO TABS
500.0000 mg | ORAL_TABLET | Freq: Two times a day (BID) | ORAL | 1 refills | Status: AC
  Filled 2024-02-12: qty 180, 90d supply, fill #0
  Filled 2024-05-14: qty 180, 90d supply, fill #1

## 2024-02-12 MED ORDER — HYDRALAZINE HCL 25 MG PO TABS
25.0000 mg | ORAL_TABLET | Freq: Three times a day (TID) | ORAL | 1 refills | Status: AC
  Filled 2024-02-12: qty 180, 60d supply, fill #0
  Filled 2024-05-14: qty 180, 60d supply, fill #1

## 2024-02-12 MED ORDER — CHLORTHALIDONE 50 MG PO TABS
50.0000 mg | ORAL_TABLET | Freq: Every day | ORAL | 1 refills | Status: AC
  Filled 2024-02-12: qty 90, 90d supply, fill #0
  Filled 2024-05-14: qty 90, 90d supply, fill #1

## 2024-02-12 MED ORDER — VALSARTAN 320 MG PO TABS
320.0000 mg | ORAL_TABLET | Freq: Every day | ORAL | 1 refills | Status: AC
  Filled 2024-02-12: qty 90, 90d supply, fill #0
  Filled 2024-05-14: qty 90, 90d supply, fill #1

## 2024-02-12 MED ORDER — AMLODIPINE BESYLATE 10 MG PO TABS
10.0000 mg | ORAL_TABLET | Freq: Every day | ORAL | 1 refills | Status: AC
  Filled 2024-02-12: qty 90, 90d supply, fill #0
  Filled 2024-05-14: qty 90, 90d supply, fill #1

## 2024-02-12 NOTE — Patient Instructions (Addendum)
 Increase valsartan  to 320 mg daily can take 2 of the current prescription when it runs out you will get a single 320 mg tablet to take daily  Increase chlorthalidone  to 50 mg daily can take 2 of the current tablets daily when you get the new medicine bottle take 1 daily  ' CPAP machine will be ordered  Referral to the sleep medicine clinic will be made  Labs to be obtained today   See below recommendations for obesity  Return to primary care for recheck 3 months and see our clinical pharmacist in 6 weeks         Advice for Weight Management   -For most of us  the best way to lose weight is by diet management. Generally speaking, diet management means consuming less calories intentionally which over time brings about progressive weight loss.  This can be achieved more effectively by avoiding ultra processed carbohydrates, processed meats, unhealthy fats.    It is critically important to know your numbers: how much calorie you are consuming and how much calorie you need. More importantly, our carbohydrates sources should be unprocessed naturally occurring  complex starch food items.  It is always important to balance nutrition also by  appropriate intake of proteins (mainly plant-based), healthy fats/oils, plenty of fruits and vegetables.    -The American College of Lifestyle Medicine (ACL M) recommends nutrition derived mostly from Whole Food, Plant Predominant Sources example an apple instead of applesauce or apple pie. Eat Plenty of vegetables, Mushrooms, fruits, Legumes, Whole Grains, Nuts, seeds in lieu of processed meats, processed snacks/pastries red meat, poultry, eggs.  Use only water  or unsweetened tea for hydration.  The College also recommends the need to stay away from risky substances including alcohol, smoking; obtaining 7-9 hours of restorative sleep, at least 150 minutes of moderate intensity exercise weekly, importance of healthy social connections, and being mindful of stress and  seek help when it is overwhelming.     -Sticking to a routine mealtime to eat 3 meals a day and avoiding unnecessary snacks is shown to have a big role in weight control. Under normal circumstances, the only time we burn stored energy is when we are hungry, so allow  some hunger to take place- hunger means no food between appropriate meal times, only water .  It is not advisable to starve.    -It is better to avoid simple carbohydrates including: Cakes, Sweet Desserts, Ice Cream, Soda (diet and regular), Sweet Tea, Candies, Chips, Cookies, Store Bought Juices, Alcohol in Excess of  1-2 drinks a day, Lemonade,  Artificial Sweeteners, Doughnuts, Coffee Creamers, Sugar-free Products, etc, etc.  This is not a complete list...SABRA.    -Consulting with certified diabetes educators is proven to provide you with the most accurate and current information on diet.  Also, you may be  interested in discussing diet options/exchanges , we can schedule a visit with Penny Crumpton, RDN, CDE for individualized nutrition education.   -Exercise: If you are able: 30 -60 minutes a day ,4 days a week, or 150 minutes of moderate intensity exercise weekly.    The longer the better if tolerated.  Combine stretch, strength, and aerobic activities.  If you were told in the past that you have high risk for cardiovascular diseases, or if you are currently symptomatic, you may seek evaluation by your heart doctor prior to initiating moderate to intense exercise programs.  Additional Care Considerations for Diabetes/Prediabetes     -Diabetes  is a chronic disease.  The most important care consideration is regular follow-up with your diabetes care provider with the goal being avoiding or delaying its complications and to take advantage of advances in medications and technology.  If appropriate actions are taken early enough, type 2 diabetes can even be reversed.  Seek information from the right  source.   - Whole Food, Plant Predominant Nutrition is highly recommended: Eat Plenty of vegetables, Mushrooms, fruits, Legumes, Whole Grains, Nuts, seeds in lieu of processed meats, processed snacks/pastries red meat, poultry, eggs as recommended by [employer] of  Lifestyle Medicine (ACLM).   -Type 2 diabetes is known to coexist with other important comorbidities such as high blood pressure and high cholesterol.  It is critical to control not only the diabetes but also the high blood pressure and high cholesterol to minimize and delay the risk of complications including coronary artery disease, stroke, amputations, blindness, etc.  The good news is that this diet recommendation for type 2 diabetes is also very helpful for managing high cholesterol and high blood blood pressure.   - Studies showed that people with diabetes will benefit from a class of medications known as ACE inhibitors and statins.  Unless there are specific reasons not to be on these medications, the standard of care is to consider getting one from these groups of medications at an optimal doses.  These medications are generally considered safe and proven to help protect the heart and the kidneys.     - People with diabetes are encouraged to initiate and maintain regular follow-up with eye doctors, foot doctors, dentists , and if necessary heart and kidney doctors.      - It is highly recommended that people with diabetes quit smoking or stay away from smoking, and get yearly  flu vaccine and pneumonia vaccine at least every 5 years.  See above for additional recommendations on exercise, sleep, stress management , and healthy social connections.

## 2024-02-13 ENCOUNTER — Ambulatory Visit: Payer: Self-pay | Admitting: Critical Care Medicine

## 2024-02-13 ENCOUNTER — Encounter: Payer: Self-pay | Admitting: Critical Care Medicine

## 2024-02-13 DIAGNOSIS — F32A Depression, unspecified: Secondary | ICD-10-CM | POA: Insufficient documentation

## 2024-02-13 LAB — BMP8+EGFR
BUN/Creatinine Ratio: 14 (ref 9–20)
BUN: 19 mg/dL (ref 6–20)
CO2: 25 mmol/L (ref 20–29)
Calcium: 9.4 mg/dL (ref 8.7–10.2)
Chloride: 100 mmol/L (ref 96–106)
Creatinine, Ser: 1.39 mg/dL — ABNORMAL HIGH (ref 0.76–1.27)
Glucose: 84 mg/dL (ref 70–99)
Potassium: 4.1 mmol/L (ref 3.5–5.2)
Sodium: 143 mmol/L (ref 134–144)
eGFR: 67 mL/min/1.73 (ref >59)

## 2024-02-13 NOTE — Assessment & Plan Note (Signed)
 Poorly controlled hypertension secondary to lack of adherence to medications and will need further adjustment also lifestyle management issues are major concern Plan will be to check metabolic panel and increase valsartan  to 320 mg daily increase chlorthalidone  to 50 mg daily maintain carvedilol  and amlodipine  as prescribed bring patient back short-term to see pharmacy for hypertension management We discussed lifestyle management approach as noted below  The following Lifestyle Medicine recommendations according to American College of Lifestyle Medicine [facility]) were discussed and offered to patient who agrees to start the journey:  A. Whole Foods, Plant-based plate comprising of fruits and vegetables, plant-based proteins, whole-grain carbohydrates was discussed in detail with the patient.   A list for source of those nutrients were also provided to the patient.  Patient will use only water  or unsweetened tea for hydration. B.  The need to stay away from risky substances including alcohol, smoking; obtaining 7 to 9 hours of restorative sleep, at least 150 minutes of moderate intensity exercise weekly, the importance of healthy social connections,  and stress reduction techniques were discussed. C.  A full color page of  Calorie density of various food groups per pound showing examples of each food groups was provided to the patient.

## 2024-02-13 NOTE — Assessment & Plan Note (Signed)
 Patient has suicidal thoughts but no plan will make referral to behavioral health

## 2024-02-13 NOTE — Progress Notes (Signed)
 Let patient know labs are stable kidney function stable take medications as prescribed

## 2024-02-13 NOTE — Assessment & Plan Note (Signed)
 Referral to pulmonary sleep medicine made and CPAP ordered

## 2024-02-17 ENCOUNTER — Other Ambulatory Visit: Payer: Self-pay

## 2024-03-09 ENCOUNTER — Ambulatory Visit: Attending: Nurse Practitioner | Admitting: Pharmacist

## 2024-03-09 ENCOUNTER — Other Ambulatory Visit: Payer: Self-pay

## 2024-03-09 ENCOUNTER — Encounter: Payer: Self-pay | Admitting: Pharmacist

## 2024-03-09 VITALS — BP 138/80

## 2024-03-09 DIAGNOSIS — I428 Other cardiomyopathies: Secondary | ICD-10-CM | POA: Diagnosis not present

## 2024-03-09 DIAGNOSIS — G4733 Obstructive sleep apnea (adult) (pediatric): Secondary | ICD-10-CM

## 2024-03-09 DIAGNOSIS — I10 Essential (primary) hypertension: Secondary | ICD-10-CM | POA: Diagnosis not present

## 2024-03-09 NOTE — Progress Notes (Signed)
 S:    PCP: Zelda   Patient arrives in good spirits. Presents to the clinic for hypertension evaluation, counseling, and management. Patient was referred and last seen by Dr. Brien 02/12/2024. At that visit, BP was 168/118 mmHg.    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   I have seen him before but his last visit with me was on 10/29/2022. Of note, at my last visit with him, Mr. Higham was behind on refills. He is on several antihypertensive medications with last fill dates as follows:   -Amlodipine : 02/17/24, 90-day (time prior to this fill was 09/02/23 for a 90-day) -Carvedilol : 02/17/24, 90-day (time prior to this fill was 09/02/23 for a 90-day) -Chlorthalidone : 02/17/24, 90-day (time prior to this fill was 09/02/23 for a 90-day) -Hydralazine : 02/17/24, 90-day (time prior to this fill was 09/02/23 for a 90-day) -Valsartan : 02/17/24, 90-day (time prior to this fill was 09/02/23 for a 90-day)  The above fill hx indicates that prior to his visit with Dr. Brien on 02/12/2024, he would have been out of all 4 BP  mediations.  Today, patient comes in for BP check. Even though his fill hx indicates poor adherence this year, he did pick-up everything on 02/17/24 for 3 month supplies. He has taken his antihypertensive medications today.   Current BP Medications include: amlodipine  10 mg daily, carvedilol  25 mg BID, chlorthalidone  50 mg daily, hydralazine  25 mg TID, valsartan  320 mg daily   Dietary habits include:  -Sodium: does not limit salt -Caffeine: tries to limit dark soda but admits this is hard working at [employer]   Exercise habits include:  -Tries some weight bearing exercise at home (names pushups) but does not   Family History: hypertension (mother and father) Tobacco: 1/2 PPD Alcohol: occasionally  O:  Home BP readings: none reported   Today's Vitals   03/09/24 1403  BP: 138/80   Last 3 Office BP  readings: BP Readings from Last 3 Encounters:  03/09/24 138/80  02/12/24 (!) 168/118  12/23/23 (!) 164/111   BMET    Component Value Date/Time   NA 143 02/12/2024 1547   K 4.1 02/12/2024 1547   CL 100 02/12/2024 1547   CO2 25 02/12/2024 1547   GLUCOSE 84 02/12/2024 1547   GLUCOSE 98 11/16/2021 0028   BUN 19 02/12/2024 1547   CREATININE 1.39 (H) 02/12/2024 1547   CALCIUM  9.4 02/12/2024 1547   GFRNONAA 44 (L) 11/16/2021 0023   GFRAA >60 02/01/2020 1622    Renal function: CrCl cannot be calculated (Patient's most recent lab result is older than the maximum 21 days allowed.).  Clinical ASCVD: No  The ASCVD Risk score (Arnett DK, et al., 2019) failed to calculate for the following reasons:   The 2019 ASCVD risk score is only valid for ages 3 to 45  A/P: Hypertension longstanding currently close to goal and much improved on current medications. BP Goal = < 130/80 mmHg. Medication adherence reported. He took his antihypertensives today. With this recent improvement in adherence, his BP is much better. Continued adherence encouraged. We will continue current regimen. He sees Zelda later this month. I will defer to her to check labs. With his hx of HFmrEF, I recommend to start MRA (eplerenone) therapy in the future should we need better BP control. I also discussed changing single-agent amlodipine , chlorthalidone , and valsartan  to combination Tribenzor (amlodipine -hydrochlorothiazide -olmesartan) to help with adherence. Pt prefers to stay on the single agents.  Lastly, I recommend to consider GLP/GIP therapy given his hx of OSA and BMI >40. With these, his insurance will cover Zepbound. Pt would like to hold off and discuss at follow-up. He does say he'd consider it in the future. -Continue amlodipine  10 mg daily -Continue chlorthalidone  50 mg daily  -Continue carvedilol  25 mg BID -Continue hydralazine  25 mg TID -Continue valsartan  320 mg daily. -Counseled on lifestyle modifications for  blood pressure control including reduced dietary sodium, increased exercise, adequate sleep.  Results reviewed and written information provided.   Total time in face-to-face counseling 15 minutes.   F/U Clinic Visit w/ me in 2 months. PCP clinic visit: 03/26/2024.  Herlene Fleeta Morris, PharmD, JAQUELINE, CPP Clinical Pharmacist [facility] [place] [phone]

## 2024-03-24 ENCOUNTER — Telehealth: Payer: Self-pay | Admitting: Nurse Practitioner

## 2024-03-24 NOTE — Telephone Encounter (Signed)
 Pt unconfirmed appt (per vr)

## 2024-03-26 ENCOUNTER — Encounter: Payer: Self-pay | Admitting: Nurse Practitioner

## 2024-03-26 ENCOUNTER — Ambulatory Visit: Attending: Nurse Practitioner | Admitting: Nurse Practitioner

## 2024-03-26 ENCOUNTER — Other Ambulatory Visit: Payer: Self-pay

## 2024-03-26 VITALS — BP 139/87 | HR 77 | Resp 19 | Ht 71.0 in | Wt 329.8 lb

## 2024-03-26 DIAGNOSIS — G8929 Other chronic pain: Secondary | ICD-10-CM

## 2024-03-26 DIAGNOSIS — F419 Anxiety disorder, unspecified: Secondary | ICD-10-CM | POA: Diagnosis not present

## 2024-03-26 DIAGNOSIS — I10 Essential (primary) hypertension: Secondary | ICD-10-CM

## 2024-03-26 DIAGNOSIS — R7303 Prediabetes: Secondary | ICD-10-CM | POA: Diagnosis not present

## 2024-03-26 DIAGNOSIS — M25561 Pain in right knee: Secondary | ICD-10-CM | POA: Diagnosis not present

## 2024-03-26 DIAGNOSIS — K089 Disorder of teeth and supporting structures, unspecified: Secondary | ICD-10-CM

## 2024-03-26 MED ORDER — MELOXICAM 15 MG PO TABS
15.0000 mg | ORAL_TABLET | Freq: Every day | ORAL | 0 refills | Status: DC
  Filled 2024-03-26: qty 30, 30d supply, fill #0

## 2024-03-26 MED ORDER — HYDROXYZINE HCL 25 MG PO TABS
25.0000 mg | ORAL_TABLET | Freq: Every evening | ORAL | 0 refills | Status: AC
  Filled 2024-03-26 – 2024-05-14 (×2): qty 60, 30d supply, fill #0

## 2024-03-26 MED ORDER — MELOXICAM 15 MG PO TABS
15.0000 mg | ORAL_TABLET | Freq: Every day | ORAL | 0 refills | Status: DC
Start: 2024-03-26 — End: 2024-03-26
  Filled 2024-03-26: qty 30, 30d supply, fill #0

## 2024-03-26 NOTE — Progress Notes (Signed)
 Assessment & Plan:  Dale Mcintosh was seen today for hypertension.  Diagnoses and all orders for this visit:  Primary hypertension Hypertension, on multidrug therapy Hypertension managed with valsartan , hydralazine , chlorthalidone , carvedilol , and amlodipine . Potential for medication adjustment if sleep apnea is controlled. - Continue current antihypertensive regimen. - Monitor blood pressure and will consider medication adjustment if sleep apnea is controlled.  Anxiety -     hydrOXYzine  (ATARAX ) 25 MG tablet; Take 1-2 tablets (25-50 mg total) by mouth at bedtime.  Chronic pain of right knee -     DG Knee Complete 4 Views Right; Future -     meloxicam  (MOBIC ) 15 MG tablet; Take 1 tablet (15 mg total) by mouth daily. RIGHT KNEE PAIN Acute right knee pain, exacerbated by walking and stair use. No swelling or warmth, suggesting no effusion or blood clot. Possible fluid accumulation not visible externally. - Ordered right knee x-ray to assess for underlying issues. - Advised will need to see an orthopedist if x-ray indicates possible fluid accumulation.   Prediabetes -     CMP14+EGFR -     Hemoglobin A1c  Poor dentition Requesting referral to Dentist -     Ambulatory referral to Dentistry    Patient has been counseled on age-appropriate routine health concerns for screening and prevention. These are reviewed and up-to-date. Referrals have been placed accordingly. Immunizations are up-to-date or declined.    Subjective:   Chief Complaint  Patient presents with   Hypertension    Dale Mcintosh 38 y.o. male presents to office today for follow up to HTN  PMH: MORBID OBESITY, NICM, OSA, HTN   HTN Blood pressure near goal. He is taking Valsartan  320 mg, chlorthalidone  50 mg, amlodipine  10 mg daily and hydralazine  25 mg TID.  BP Readings from Last 3 Encounters:  03/26/24 139/87  03/09/24 138/80  02/12/24 (!) 168/118    He awoke yesterday with pain in his right knee. The pain is  exacerbated by bending the knee, particularly when descending stairs, and has made it difficult for him to walk up and down stairs, leading him to call out of work today. There is no swelling or warmth in the knee. He mentions that sometimes his legs lock up when he sits for extended periods, but this episode of knee pain began after sleeping.  He is currently using a CPAP machine for sleep apnea but finds it uncomfortable, which affects his ability to use it throughout the night. He will be prescribed hydroxyzine  to help with anxiety and improve sleep quality while using the CPAP. He is instructed to start with one tablet and can increase to two if needed.  Review of Systems  Constitutional:  Negative for fever, malaise/fatigue and weight loss.  HENT: Negative.  Negative for nosebleeds.   Eyes: Negative.  Negative for blurred vision, double vision and photophobia.  Respiratory: Negative.  Negative for cough and shortness of breath.   Cardiovascular: Negative.  Negative for chest pain, palpitations and leg swelling.  Gastrointestinal: Negative.  Negative for heartburn, nausea and vomiting.  Musculoskeletal:  Positive for joint pain. Negative for myalgias.  Neurological: Negative.  Negative for dizziness, focal weakness, seizures and headaches.  Psychiatric/Behavioral:  Negative for depression and suicidal ideas. The patient is nervous/anxious.     Past Medical History:  Diagnosis Date   Hyperlipidemia    Hypertension    Nonischemic cardiomyopathy (HCC)    a. Echo 4/20220: LVEF of 40-45% (coronary CTA at the time showed coronary calcium  score of 0  and normal coronaries) - felt to be due to uncontrolled hypertension   Obesity    OSA (obstructive sleep apnea)    Tobacco abuse     Past Surgical History:  Procedure Laterality Date   NO PAST SURGERIES     None      Family History  Problem Relation Age of Onset   Hypertension Mother    Hypertension Father    Diabetes Father      Social History Reviewed with no changes to be made today.   Outpatient Medications Prior to Visit  Medication Sig Dispense Refill   amLODipine  (NORVASC ) 10 MG tablet Take 1 tablet (10 mg total) by mouth daily. 90 tablet 1   carvedilol  (COREG ) 25 MG tablet Take 1 tablet (25 mg total) by mouth 2 (two) times daily with a meal. 180 tablet 1   chlorthalidone  (HYGROTON ) 50 MG tablet Take 1 tablet (50 mg total) by mouth daily. 90 tablet 1   hydrALAZINE  (APRESOLINE ) 25 MG tablet Take 1 tablet (25 mg total) by mouth 3 (three) times daily. 180 tablet 1   metFORMIN  (GLUCOPHAGE ) 500 MG tablet Take 1 tablet (500 mg total) by mouth 2 (two) times daily with a meal. 180 tablet 1   valsartan  (DIOVAN ) 320 MG tablet Take 1 tablet (320 mg total) by mouth daily. 90 tablet 1   lidocaine  4 % Place 1 patch onto the skin daily. (Patient not taking: Reported on 03/26/2024) 20 patch 0   No facility-administered medications prior to visit.    Allergies  Allergen Reactions   Bee Venom Anaphylaxis   Bee Venom    Shrimp (Diagnostic)    Shrimp [Shellfish Allergy] Itching       Objective:    BP 139/87 (BP Location: Right Arm, Patient Position: Sitting, Cuff Size: Normal)   Pulse 77   Resp 19   Ht 5' 11 (1.803 m)   Wt (!) 329 lb 12.8 oz (149.6 kg)   SpO2 97%   BMI 46.00 kg/m  Wt Readings from Last 3 Encounters:  03/26/24 (!) 329 lb 12.8 oz (149.6 kg)  02/12/24 (!) 331 lb 6.4 oz (150.3 kg)  06/07/23 (!) 321 lb (145.6 kg)    Physical Exam Vitals and nursing note reviewed.  Constitutional:      Appearance: He is well-developed. He is obese.  HENT:     Head: Normocephalic and atraumatic.     Mouth/Throat:     Dentition: Abnormal dentition.  Cardiovascular:     Rate and Rhythm: Normal rate and regular rhythm.     Heart sounds: Normal heart sounds. No murmur heard.    No friction rub. No gallop.  Pulmonary:     Effort: Pulmonary effort is normal. No tachypnea or respiratory distress.     Breath  sounds: Normal breath sounds. No decreased breath sounds, wheezing, rhonchi or rales.  Chest:     Chest wall: No tenderness.  Musculoskeletal:        General: Normal range of motion.     Cervical back: Normal range of motion.  Skin:    General: Skin is warm and dry.  Neurological:     Mental Status: He is alert and oriented to person, place, and time.     Coordination: Coordination normal.  Psychiatric:        Behavior: Behavior normal. Behavior is cooperative.        Thought Content: Thought content normal.        Judgment: Judgment normal.  Patient has been counseled extensively about nutrition and exercise as well as the importance of adherence with medications and regular follow-up. The patient was given clear instructions to go to ER or return to medical center if symptoms don't improve, worsen or new problems develop. The patient verbalized understanding.   Follow-up: Return in about 3 months (around 06/26/2024).   Dale LELON Servant, FNP-BC [facility] [place], [place] [phone]   03/26/2024, 10:10 PM

## 2024-03-26 NOTE — Patient Instructions (Addendum)
 [facility] [address]  [phone]

## 2024-03-27 LAB — CMP14+EGFR
ALT: 22 IU/L (ref 0–44)
AST: 26 IU/L (ref 0–40)
Albumin: 4.4 g/dL (ref 4.1–5.1)
Alkaline Phosphatase: 73 IU/L (ref 47–123)
BUN/Creatinine Ratio: 12 (ref 9–20)
BUN: 17 mg/dL (ref 6–20)
Bilirubin Total: 0.3 mg/dL (ref 0.0–1.2)
CO2: 29 mmol/L (ref 20–29)
Calcium: 9.9 mg/dL (ref 8.7–10.2)
Chloride: 100 mmol/L (ref 96–106)
Creatinine, Ser: 1.42 mg/dL — ABNORMAL HIGH (ref 0.76–1.27)
Globulin, Total: 3 g/dL (ref 1.5–4.5)
Glucose: 96 mg/dL (ref 70–99)
Potassium: 4 mmol/L (ref 3.5–5.2)
Sodium: 146 mmol/L — ABNORMAL HIGH (ref 134–144)
Total Protein: 7.4 g/dL (ref 6.0–8.5)
eGFR: 65 mL/min/1.73 (ref >59)

## 2024-03-27 LAB — HEMOGLOBIN A1C
Est. average glucose Bld gHb Est-mCnc: 120 mg/dL
Hgb A1c MFr Bld: 5.8 % — ABNORMAL HIGH (ref 4.8–5.6)

## 2024-03-29 ENCOUNTER — Telehealth: Payer: Self-pay

## 2024-03-29 NOTE — Telephone Encounter (Signed)
 ATC, unable to LVM as VM box was full. Attempted to reach pt to ask him if he was currently using a CPAP machine, and if so, to bring it with him to his appt with Dr. Theodoro on 03/30/24.

## 2024-03-30 ENCOUNTER — Ambulatory Visit

## 2024-03-30 VITALS — BP 144/87 | HR 82 | Temp 99.2°F | Ht 70.5 in | Wt 334.6 lb

## 2024-03-30 DIAGNOSIS — G4733 Obstructive sleep apnea (adult) (pediatric): Secondary | ICD-10-CM | POA: Diagnosis not present

## 2024-03-30 DIAGNOSIS — F17210 Nicotine dependence, cigarettes, uncomplicated: Secondary | ICD-10-CM | POA: Diagnosis not present

## 2024-03-30 DIAGNOSIS — Z6841 Body Mass Index (BMI) 40.0 and over, adult: Secondary | ICD-10-CM | POA: Diagnosis not present

## 2024-03-30 DIAGNOSIS — Z716 Tobacco abuse counseling: Secondary | ICD-10-CM

## 2024-03-30 NOTE — Assessment & Plan Note (Addendum)
 Dale Mcintosh

## 2024-03-30 NOTE — Progress Notes (Signed)
 Pulmonology Office Visit   Subjective:  Patient ID: Dale Mcintosh, male    DOB: 12-08-1985  MRN: [id number]  Referred by: Brien Belvie BRAVO, MD  CC:  Chief Complaint  Patient presents with   Consult    Pt has been using BiPAP machine for 11 days now. Pt states that he feels like he's suffocating and takes the mask off at night.     HPI Dale Mcintosh is a 38 y.o. male with Hypertension, OSA, depression, morbid obesity, presents for evaluation and management of sleep apnea.  Respective notes from provider reviewed as appropriate to gather relevant information for patient care.   Discussed the use of AI scribe software for clinical note transcription with the patient, who gave verbal consent to proceed.  History of Present Illness   Dale Mcintosh is a 38 year old male with sleep apnea who presents for management of his condition. He was referred by Dr. Brien for management of his sleep apnea.  He underwent a sleep study in March 2024, after which he was told he stopped breathing approximately 22 times per hour during sleep. He was prescribed a CPAP machine but has difficulty tolerating the full face mask, often waking up feeling suffocated and removing the mask during the night. He has been using the CPAP machine for about 11 days, with usage on 10 out of those days. He is not accustomed to having something on his face while sleeping, contributing to his discomfort.  He wakes up two to three times per night, attributing this to his blood pressure medication. His sleep schedule varies, going to bed between 12 and 2 AM and waking up around 7 to 8 AM, depending on his work schedule. He experiences occasional dry mouth at night and sometimes wakes up gasping for air. He used to have morning headaches, which he associates with previously high blood pressure. No current nasal congestion or allergies are reported.  He smokes approximately a pack of cigarettes per day, a habit that has worsened  over the past year due to stress. He started smoking at age 40 but had periods of cessation. He wants to quit smoking but finds it challenging due to stress and life circumstances.  He works at [employer] and does not work night shifts.         OSA history: Sleep study 07/2202>CPAP started in Nov 2025.    PAP download compliance data: [employer].  Data for last 11 days.  Encore/Airview- [employer] Pressure: 10-20 cm H2O. 9.8 med w max 16.  Hours of usage: 4 hr.  Days used >4hr: 9/10 Leak: acceptable.  AHI: 6.   PRIOR TESTS and IMAGING: Split-night study 07/2022, weight 304 pounds, AHI 22.4, no significant PLMS, O2 nadir 77%, desaturation 4.9 minutes.  CPAP 14-16 cm control sleep apnea.  Echo April 2020 EF 40-45%, grade 2 diastolic dysfunction, no valvular abnormalities.     03/30/2024    3:00 PM  Results of the Epworth flowsheet  Sitting and reading 0  Watching TV 1  Sitting, inactive in a public place (e.g. a theatre or a meeting) 1  As a passenger in a car for an hour without a break 0  Lying down to rest in the afternoon when circumstances permit 3  Sitting and talking to someone 0  Sitting quietly after a lunch without alcohol 0  In a car, while stopped for a few minutes in traffic 0  Total score 5    Allergies: Bee venom, Bee venom,  Shrimp (diagnostic), and Shrimp [shellfish allergy]  Current Outpatient Medications:    amLODipine  (NORVASC ) 10 MG tablet, Take 1 tablet (10 mg total) by mouth daily., Disp: 90 tablet, Rfl: 1   carvedilol  (COREG ) 25 MG tablet, Take 1 tablet (25 mg total) by mouth 2 (two) times daily with a meal., Disp: 180 tablet, Rfl: 1   chlorthalidone  (HYGROTON ) 50 MG tablet, Take 1 tablet (50 mg total) by mouth daily., Disp: 90 tablet, Rfl: 1   hydrALAZINE  (APRESOLINE ) 25 MG tablet, Take 1 tablet (25 mg total) by mouth 3 (three) times daily., Disp: 180 tablet, Rfl: 1   metFORMIN  (GLUCOPHAGE ) 500 MG tablet, Take 1 tablet (500 mg total) by mouth 2  (two) times daily with a meal., Disp: 180 tablet, Rfl: 1   valsartan  (DIOVAN ) 320 MG tablet, Take 1 tablet (320 mg total) by mouth daily., Disp: 90 tablet, Rfl: 1   hydrOXYzine  (ATARAX ) 25 MG tablet, Take 1-2 tablets (25-50 mg total) by mouth at bedtime. (Patient not taking: Reported on 03/30/2024), Disp: 60 tablet, Rfl: 0   meloxicam  (MOBIC ) 15 MG tablet, Take 1 tablet (15 mg total) by mouth daily. RIGHT KNEE PAIN (Patient not taking: Reported on 03/30/2024), Disp: 30 tablet, Rfl: 0 Past Medical History:  Diagnosis Date   Hyperlipidemia    Hypertension    Nonischemic cardiomyopathy (HCC)    a. Echo 4/20220: LVEF of 40-45% (coronary CTA at the time showed coronary calcium  score of 0 and normal coronaries) - felt to be due to uncontrolled hypertension   Obesity    OSA (obstructive sleep apnea)    Tobacco abuse    Past Surgical History:  Procedure Laterality Date   NO PAST SURGERIES     None     Family History  Problem Relation Age of Onset   Hypertension Mother    Hypertension Father    Diabetes Father    Social History   Socioeconomic History   Marital status: Single    Spouse name: Not on file   Number of children: Not on file   Years of education: Not on file   Highest education level: Not on file  Occupational History   Not on file  Tobacco Use   Smoking status: Every Day    Types: Cigarettes   Smokeless tobacco: Current   Tobacco comments:    Pt has been smoking about 25 years. Smokes about 1/2 ppd  Vaping Use   Vaping status: Never Used  Substance and Sexual Activity   Alcohol use: Never    Comment: occ   Drug use: Yes    Types: Marijuana    Comment: daily   Sexual activity: Yes  Other Topics Concern   Not on file  Social History Narrative   ** Merged History Encounter **       Social Drivers of Health   Financial Resource Strain: Medium Risk (04/14/2023)   Overall Financial Resource Strain (CARDIA)    Difficulty of Paying Living Expenses: Somewhat hard   Food Insecurity: Food Insecurity Present (04/14/2023)   Hunger Vital Sign    Worried About Running Out of Food in the Last Year: Sometimes true    Ran Out of Food in the Last Year: Often true  Transportation Needs: No Transportation Needs (04/14/2023)   PRAPARE - Administrator, Civil Service (Medical): No    Lack of Transportation (Non-Medical): No  Physical Activity: Inactive (04/14/2023)   Exercise Vital Sign    Days of Exercise per Week: 0  days    Minutes of Exercise per Session: 0 min  Stress: Stress Concern Present (04/14/2023)   [employer] of Occupational Health - Occupational Stress Questionnaire    Feeling of Stress : Very much  Social Connections: Socially Isolated (04/14/2023)   Social Connection and Isolation Panel    Frequency of Communication with Friends and Family: More than three times a week    Frequency of Social Gatherings with Friends and Family: Never    Attends Religious Services: Never    Database Administrator or Organizations: No    Attends Banker Meetings: Never    Marital Status: Never married  Intimate Partner Violence: Not At Risk (04/14/2023)   Humiliation, Afraid, Rape, and Kick questionnaire    Fear of Current or Ex-Partner: No    Emotionally Abused: No    Physically Abused: No    Sexually Abused: No       Objective:  BP (!) 155/103   Pulse 82   Temp 99.2 F (37.3 C)   Ht 5' 10.5 (1.791 m) Comment: Per pt  Wt (!) 334 lb 9.6 oz (151.8 kg)   SpO2 95% Comment: RA  BMI 47.33 kg/m  BMI Readings from Last 3 Encounters:  03/30/24 47.33 kg/m  03/26/24 46.00 kg/m  02/12/24 46.22 kg/m    Physical Exam: Physical Exam   ENT: Normal mucosa. No hypertrophy of inferior turbinates. Tonsils are normal sized. Modified Mallampati score is normal. Pharynx normal. PULMONARY: Lungs clear to auscultation bilaterally, no adventitious breath sounds. CARDIOVASCULAR: Regular rate and rhythm, S1 S2 normal, no murmurs. ABDOMEN:  Abdomen soft, nontender. Bowel sounds are normal. EXTREMITIES: No peripheral edema noted.       Diagnostic Review:  Last metabolic panel Lab Results  Component Value Date   GLUCOSE 96 03/26/2024   NA 146 (H) 03/26/2024   K 4.0 03/26/2024   CL 100 03/26/2024   CO2 29 03/26/2024   BUN 17 03/26/2024   CREATININE 1.42 (H) 03/26/2024   EGFR 65 03/26/2024   CALCIUM  9.9 03/26/2024   PROT 7.4 03/26/2024   ALBUMIN 4.4 03/26/2024   LABGLOB 3.0 03/26/2024   AGRATIO 1.6 06/14/2022   BILITOT 0.3 03/26/2024   ALKPHOS 73 03/26/2024   AST 26 03/26/2024   ALT 22 03/26/2024   ANIONGAP 20 (H) 11/16/2021         Assessment & Plan:   Assessment & Plan OSA (obstructive sleep apnea)  Orders:   Ambulatory Referral for DME   Ambulatory Referral for DME  Morbid obesity (HCC)     Encounter for smoking cessation counseling Smoking/Tobacco Cessation Counseling Does not want to use chantix and patches.  Elane Molt is a current user of tobacco or nicotine  products. He is considering quitting at this time. Counseling provided today addressed the risks of continued use and the benefits of cessation. Discussed tobacco/nicotine  use history, readiness to quit, and evidence-based treatment options including behavioral strategies, support resources, and pharmacologic therapies. Provided encouragement and educational materials on steps and resources to quit smoking. Patient questions were addressed, and follow-up recommended for continued support. Total time spent on counseling: 5 minutes.         Assessment and Plan    Mod Obstructive sleep apnea Diagnosed with AHI of 22. Good CPAP adherence but discomfort with full face mask. Residual AHI 6. - Changed to nasal mask for comfort. - Adjusted CPAP pressure to 14 over 16. - Review compliance data in one month. Current has decent compliance data.  We  did discuss about need for compliance to meet insurance requirement.  The patient has to use  CPAP for at least 5 out of 7 days in a week for a consecutive month within the first 3 months after getting CPAP. - change to hybrid mask.  - I discussed with the patient the pathophysiology of obstructive sleep apnea, its association with weight, and its negative effects on hypertension, diabetes, mental health, A-fib, stroke if left untreated.  I briefly discussed the treatment options for obstructive sleep apnea   Tobacco use disorder Long-standing use, currently one pack per day. Expressed desire to quit but not ready for medication or patches. - Provided smoking cessation resources, including 1-800-QUIT-NOW. - Discussed future use of medication and patches.   Morbid obesity: - following weight managment center.      Notes from Dr Brien on 02/12/24 and PCP 03/26/24 reviewed as to gather relevant information for patient care and formulating plan.  He was counselled about not driving while drowsy which is common side effect of sleep related disorders.   Return in about 6 weeks (around 05/11/2024).   I personally spent a total of 30 minutes in the care of the patient today including preparing to see the patient, getting/reviewing separately obtained history, performing a medically appropriate exam/evaluation, counseling and educating, placing orders, documenting clinical information in the EHR, independently interpreting results, and communicating results.   Rayne Cowdrey, MD

## 2024-03-30 NOTE — Patient Instructions (Addendum)
 We did discuss about need for compliance to meet insurance requirement.  The patient has to use CPAP for at least 5 out of 7 days in a week for a consecutive month within the first 3 months after getting CPAP.    VISIT SUMMARY: Today, we discussed the management of your sleep apnea and smoking habits. You have been using your CPAP machine but are experiencing discomfort with the full face mask. We also talked about your desire to quit smoking and provided some resources to help you with that.  YOUR PLAN: -OBSTRUCTIVE SLEEP APNEA: Obstructive sleep apnea is a condition where your breathing stops and starts repeatedly during sleep. We have changed your CPAP mask to a nasal mask for better comfort and adjusted the pressure settings. We will review your compliance data in one month to see how you are doing with the new setup.  -TOBACCO USE DISORDER: Tobacco use disorder is a condition where you are dependent on tobacco. You expressed a desire to quit smoking but are not ready for medication or patches at this time. We provided you with smoking cessation resources, including the hotline 1-800-QUIT-NOW, and discussed the possibility of using medication and patches in the future.  INSTRUCTIONS: Please use the nasal CPAP mask as directed and try to keep it on throughout the night. We will review your compliance data in one month. If you have any issues or discomfort, please let us  know. For smoking cessation, consider calling 1-800-QUIT-NOW for support and resources. We can discuss medication and patches to help you quit smoking in the future if you feel ready.                      Contains text generated by Abridge.                                 Contains text generated by Abridge.

## 2024-03-30 NOTE — Assessment & Plan Note (Addendum)
  Orders:   Ambulatory Referral for DME   Ambulatory Referral for DME

## 2024-04-04 ENCOUNTER — Ambulatory Visit: Payer: Self-pay | Admitting: Nurse Practitioner

## 2024-04-06 ENCOUNTER — Other Ambulatory Visit: Payer: Self-pay | Admitting: *Deleted

## 2024-04-06 ENCOUNTER — Other Ambulatory Visit: Payer: Self-pay

## 2024-04-06 DIAGNOSIS — G4733 Obstructive sleep apnea (adult) (pediatric): Secondary | ICD-10-CM

## 2024-05-10 ENCOUNTER — Ambulatory Visit

## 2024-05-10 NOTE — Assessment & Plan Note (Deleted)
 Dale Mcintosh

## 2024-05-10 NOTE — Progress Notes (Deleted)
 "  Pulmonology Office Visit   Subjective:  Patient ID: Dale Mcintosh, male    DOB: 1985-10-09  MRN: [id number]  Referred by: Theotis Haze ORN, NP  CC:  No chief complaint on file.   HPI Dale Mcintosh is a 39 y.o. male with Hypertension, OSA, depression, morbid obesity, presents for evaluation and management of sleep apnea.  Respective notes from provider reviewed as appropriate to gather relevant information for patient care.   Discussed the use of AI scribe software for clinical note transcription with the patient, who gave verbal consent to proceed.  History of Present Illness             OSA history: Sleep study 07/2202>CPAP started in Nov 2025.    PAP download compliance data: [employer].  *** Encore/Airview- [employer] Pressure *** Hours of usage:  Days used >4hr:  Leak: acceptable.  AHI: ***   PRIOR TESTS and IMAGING: Split-night study 07/2022, weight 304 pounds, AHI 22.4, no significant PLMS, O2 nadir 77%, desaturation 4.9 minutes.  CPAP 14-16 cm control sleep apnea.  Echo April 2020 EF 40-45%, grade 2 diastolic dysfunction, no valvular abnormalities.     03/30/2024    3:00 PM  Results of the Epworth flowsheet  Sitting and reading 0  Watching TV 1  Sitting, inactive in a public place (e.g. a theatre or a meeting) 1  As a passenger in a car for an hour without a break 0  Lying down to rest in the afternoon when circumstances permit 3  Sitting and talking to someone 0  Sitting quietly after a lunch without alcohol 0  In a car, while stopped for a few minutes in traffic 0  Total score 5    Allergies: Bee venom, Bee venom, Shrimp (diagnostic), and Shrimp [shellfish allergy]  Current Outpatient Medications:    amLODipine  (NORVASC ) 10 MG tablet, Take 1 tablet (10 mg total) by mouth daily., Disp: 90 tablet, Rfl: 1   carvedilol  (COREG ) 25 MG tablet, Take 1 tablet (25 mg total) by mouth 2 (two) times daily with a meal., Disp: 180 tablet, Rfl: 1    chlorthalidone  (HYGROTON ) 50 MG tablet, Take 1 tablet (50 mg total) by mouth daily., Disp: 90 tablet, Rfl: 1   hydrALAZINE  (APRESOLINE ) 25 MG tablet, Take 1 tablet (25 mg total) by mouth 3 (three) times daily., Disp: 180 tablet, Rfl: 1   hydrOXYzine  (ATARAX ) 25 MG tablet, Take 1-2 tablets (25-50 mg total) by mouth at bedtime. (Patient not taking: Reported on 03/30/2024), Disp: 60 tablet, Rfl: 0   meloxicam  (MOBIC ) 15 MG tablet, Take 1 tablet (15 mg total) by mouth daily. RIGHT KNEE PAIN (Patient not taking: Reported on 03/30/2024), Disp: 30 tablet, Rfl: 0   metFORMIN  (GLUCOPHAGE ) 500 MG tablet, Take 1 tablet (500 mg total) by mouth 2 (two) times daily with a meal., Disp: 180 tablet, Rfl: 1   valsartan  (DIOVAN ) 320 MG tablet, Take 1 tablet (320 mg total) by mouth daily., Disp: 90 tablet, Rfl: 1 Past Medical History:  Diagnosis Date   Hyperlipidemia    Hypertension    Nonischemic cardiomyopathy (HCC)    a. Echo 4/20220: LVEF of 40-45% (coronary CTA at the time showed coronary calcium  score of 0 and normal coronaries) - felt to be due to uncontrolled hypertension   Obesity    OSA (obstructive sleep apnea)    Tobacco abuse    Past Surgical History:  Procedure Laterality Date   NO PAST SURGERIES     None  Family History  Problem Relation Age of Onset   Hypertension Mother    Hypertension Father    Diabetes Father    Social History   Socioeconomic History   Marital status: Single    Spouse name: Not on file   Number of children: Not on file   Years of education: Not on file   Highest education level: Not on file  Occupational History   Not on file  Tobacco Use   Smoking status: Every Day    Types: Cigarettes   Smokeless tobacco: Current   Tobacco comments:    Pt has been smoking about 25 years. Smokes about 1/2 ppd  Vaping Use   Vaping status: Never Used  Substance and Sexual Activity   Alcohol use: Never    Comment: occ   Drug use: Yes    Types: Marijuana    Comment:  daily   Sexual activity: Yes  Other Topics Concern   Not on file  Social History Narrative   ** Merged History Encounter **       Social Drivers of Health   Tobacco Use: High Risk (03/30/2024)   Patient History    Smoking Tobacco Use: Every Day    Smokeless Tobacco Use: Current    Passive Exposure: Not on file  Financial Resource Strain: Medium Risk (04/14/2023)   Overall Financial Resource Strain (CARDIA)    Difficulty of Paying Living Expenses: Somewhat hard  Food Insecurity: Food Insecurity Present (04/14/2023)   Hunger Vital Sign    Worried About Running Out of Food in the Last Year: Sometimes true    Ran Out of Food in the Last Year: Often true  Transportation Needs: No Transportation Needs (04/14/2023)   PRAPARE - Administrator, Civil Service (Medical): No    Lack of Transportation (Non-Medical): No  Physical Activity: Inactive (04/14/2023)   Exercise Vital Sign    Days of Exercise per Week: 0 days    Minutes of Exercise per Session: 0 min  Stress: Stress Concern Present (04/14/2023)   [employer] of Occupational Health - Occupational Stress Questionnaire    Feeling of Stress : Very much  Social Connections: Socially Isolated (04/14/2023)   Social Connection and Isolation Panel    Frequency of Communication with Friends and Family: More than three times a week    Frequency of Social Gatherings with Friends and Family: Never    Attends Religious Services: Never    Database Administrator or Organizations: No    Attends Banker Meetings: Never    Marital Status: Never married  Intimate Partner Violence: Not At Risk (04/14/2023)   Humiliation, Afraid, Rape, and Kick questionnaire    Fear of Current or Ex-Partner: No    Emotionally Abused: No    Physically Abused: No    Sexually Abused: No  Depression (PHQ2-9): High Risk (02/12/2024)   Depression (PHQ2-9)    PHQ-2 Score: 22  Alcohol Screen: Not on file  Housing: Low Risk (04/14/2023)    Housing    Last Housing Risk Score: 0  Utilities: Not At Risk (04/14/2023)   AHC Utilities    Threatened with loss of utilities: No  Health Literacy: Adequate Health Literacy (04/14/2023)   B1300 Health Literacy    Frequency of need for help with medical instructions: Never       Objective:  There were no vitals taken for this visit. BMI Readings from Last 3 Encounters:  03/30/24 47.33 kg/m  03/26/24 46.00 kg/m  02/12/24 46.22 kg/m    Physical Exam: Physical Exam           Diagnostic Review:  Last metabolic panel Lab Results  Component Value Date   GLUCOSE 96 03/26/2024   NA 146 (H) 03/26/2024   K 4.0 03/26/2024   CL 100 03/26/2024   CO2 29 03/26/2024   BUN 17 03/26/2024   CREATININE 1.42 (H) 03/26/2024   EGFR 65 03/26/2024   CALCIUM  9.9 03/26/2024   PROT 7.4 03/26/2024   ALBUMIN 4.4 03/26/2024   LABGLOB 3.0 03/26/2024   AGRATIO 1.6 06/14/2022   BILITOT 0.3 03/26/2024   ALKPHOS 73 03/26/2024   AST 26 03/26/2024   ALT 22 03/26/2024   ANIONGAP 20 (H) 11/16/2021         Assessment & Plan:   Assessment & Plan OSA (obstructive sleep apnea)     Morbid obesity (HCC) Follows with weight center.    Encounter for smoking cessation counseling        Assessment and Plan    Mod Obstructive sleep apnea Diagnosed with AHI of 22. Good CPAP adherence but discomfort with full face mask. Residual AHI 6. - Changed to nasal mask for comfort. - Adjusted CPAP pressure to 14 over 16. - Review compliance data in one month. Current has decent compliance data.  We did discuss about need for compliance to meet insurance requirement.  The patient has to use CPAP for at least 5 out of 7 days in a week for a consecutive month within the first 3 months after getting CPAP. - change to hybrid mask.  - I discussed with the patient the pathophysiology of obstructive sleep apnea, its association with weight, and its negative effects on hypertension, diabetes, mental health,  A-fib, stroke if left untreated.  I briefly discussed the treatment options for obstructive sleep apnea   Tobacco use disorder Long-standing use, currently one pack per day. Expressed desire to quit but not ready for medication or patches. - Provided smoking cessation resources, including 1-800-QUIT-NOW. - Discussed future use of medication and patches.   Morbid obesity: - following weight managment center.      Notes from Dr Brien on 02/12/24 and PCP 03/26/24 reviewed as to gather relevant information for patient care and formulating plan.  He was counselled about not driving while drowsy which is common side effect of sleep related disorders.   No follow-ups on file.   I personally spent a total of 30 minutes in the care of the patient today including preparing to see the patient, getting/reviewing separately obtained history, performing a medically appropriate exam/evaluation, counseling and educating, placing orders, documenting clinical information in the EHR, independently interpreting results, and communicating results.   Sammi Fredericks, MD "

## 2024-05-10 NOTE — Assessment & Plan Note (Deleted)
 Follows with weight center.

## 2024-05-11 ENCOUNTER — Other Ambulatory Visit: Payer: Self-pay

## 2024-05-11 ENCOUNTER — Encounter: Payer: Self-pay | Admitting: Pharmacist

## 2024-05-11 ENCOUNTER — Ambulatory Visit: Attending: Family Medicine | Admitting: Pharmacist

## 2024-05-11 VITALS — BP 154/96 | HR 80

## 2024-05-11 DIAGNOSIS — I10 Essential (primary) hypertension: Secondary | ICD-10-CM

## 2024-05-11 MED ORDER — EPLERENONE 25 MG PO TABS
25.0000 mg | ORAL_TABLET | Freq: Every day | ORAL | 2 refills | Status: AC
  Filled 2024-05-11: qty 30, 30d supply, fill #0

## 2024-05-11 NOTE — Progress Notes (Signed)
" ° °  S:    PCP: Zelda   Patient arrives in good spirits. Presents to the clinic for hypertension evaluation, counseling, and management. Patient was referred and last seen by Haze Servant on 03/26/24. At that visit, BP was 139/87 mmHg (down from 168/118 mmHg in October).    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   I last saw him on 03/09/24. BP with me at that time was 138/80 mmHg. I made no changes to his medications. Today, he has taken his antihypertensive medications.  He is on several antihypertensive medications with last fill dates as follows:   -Amlodipine : 02/17/24, 90-day (time prior to this fill was 09/02/23 for a 90-day) -Carvedilol : 02/17/24, 90-day (time prior to this fill was 09/02/23 for a 90-day) -Chlorthalidone : 02/17/24, 90-day (time prior to this fill was 09/02/23 for a 90-day) -Hydralazine : 02/17/24, 90-day (time prior to this fill was 09/02/23 for a 90-day) -Valsartan : 02/17/24, 90-day (time prior to this fill was 09/02/23 for a 90-day)  Current BP Medications include: amlodipine  10 mg daily, carvedilol  25 mg BID, chlorthalidone  50 mg daily, hydralazine  25 mg TID, valsartan  320 mg daily   Dietary habits include:  -Sodium: does not limit salt -Caffeine: tries to limit dark soda but admits this is hard working at [employer]   Exercise habits include:  -Tries some weight bearing exercise at home (names pushups) but does not   Family History: hypertension (mother and father) Tobacco: 1/2 PPD Alcohol: occasionally  O:  Home BP readings: none reported   Today's Vitals   05/11/24 1108  BP: (!) 154/96  Pulse: 80   Last 3 Office BP readings: BP Readings from Last 3 Encounters:  05/11/24 (!) 154/96  03/30/24 (!) 144/87  03/26/24 139/87   BMET    Component Value Date/Time   NA 146 (H) 03/26/2024 1427   K 4.0 03/26/2024 1427   CL 100 03/26/2024 1427   CO2 29 03/26/2024 1427   GLUCOSE 96  03/26/2024 1427   GLUCOSE 98 11/16/2021 0028   BUN 17 03/26/2024 1427   CREATININE 1.42 (H) 03/26/2024 1427   CALCIUM  9.9 03/26/2024 1427   GFRNONAA 44 (L) 11/16/2021 0023   GFRAA >60 02/01/2020 1622   Renal function: CrCl cannot be calculated (Patient's most recent lab result is older than the maximum 21 days allowed.).  Clinical ASCVD: No  The ASCVD Risk score (Arnett DK, et al., 2019) failed to calculate for the following reasons:   The 2019 ASCVD risk score is only valid for ages 81 to 15  A/P: Hypertension longstanding currently above goal.. BP Goal = < 130/80 mmHg. Medication adherence reported. He took his antihypertensives today. With his hx of HFmrEF, I recommend to start MRA (eplerenone ) therapy. I also discussed changing single-agent amlodipine , chlorthalidone , and valsartan  to combination Tribenzor (amlodipine -hydrochlorothiazide -olmesartan) to help with adherence. Pt prefers to stay on the single agents.  -Continue amlodipine  10 mg daily -Continue chlorthalidone  50 mg daily  -Continue carvedilol  25 mg BID -Continue hydralazine  25 mg TID -Continue valsartan  320 mg daily. -START eplerenone  25 mg daily. CMP anticipated in 2 weeks.  -Counseled on lifestyle modifications for blood pressure control including reduced dietary sodium, increased exercise, adequate sleep.  Results reviewed and written information provided.   Total time in face-to-face counseling 15 minutes.   F/U Clinic Visit w/ me in 2 weeks.  Herlene Fleeta Morris, PharmD, JAQUELINE, CPP Clinical Pharmacist [facility] & [facility] [phone]  "

## 2024-05-14 ENCOUNTER — Other Ambulatory Visit: Payer: Self-pay

## 2024-05-20 ENCOUNTER — Other Ambulatory Visit: Payer: Self-pay

## 2024-05-24 ENCOUNTER — Other Ambulatory Visit: Payer: Self-pay

## 2024-05-24 NOTE — Progress Notes (Unsigned)
" ° °  S:    PCP: Zelda   Patient arrives in good spirits. Presents to the clinic for hypertension evaluation, counseling, and management. Patient was referred and last seen by Haze Servant on 03/26/24. At that visit, BP was 139/87 mmHg (down from 168/118 mmHg in October).    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   Pharmacy Visit History:  - 03/09/24: BP 138/80 mmHg. no changes to his medications.  - 05/12/23: BP 154/96 mmHg with medications. Added eplerenone  25 mg daily   last fill dates as follows:  -Amlodipine : 02/17/24, 90-day (time prior to this fill was 09/02/23 for a 90-day) -Carvedilol : 02/17/24, 90-day (time prior to this fill was 09/02/23 for a 90-day) -Chlorthalidone : 02/17/24, 90-day (time prior to this fill was 09/02/23 for a 90-day) - Eplerenone : not filled -Hydralazine : 02/17/24, 90-day (time prior to this fill was 09/02/23 for a 90-day) -Valsartan : 02/17/24, 90-day (time prior to this fill was 09/02/23 for a 90-day)  Current BP Medications include: amlodipine  10 mg daily, carvedilol  25 mg BID, chlorthalidone  50 mg daily, hydralazine  25 mg TID, valsartan  320 mg daily   Dietary habits include:  -Sodium: does not limit salt -Caffeine: tries to limit dark soda but admits this is hard working at [employer]   Exercise habits include:  -Tries some weight bearing exercise at home (names pushups) but does not   Family History: hypertension (mother and father) Tobacco: 1/2 PPD Alcohol: occasionally  O:  Home BP readings: none reported   There were no vitals filed for this visit.  Last 3 Office BP readings: BP Readings from Last 3 Encounters:  05/11/24 (!) 154/96  03/30/24 (!) 144/87  03/26/24 139/87   BMET    Component Value Date/Time   NA 146 (H) 03/26/2024 1427   K 4.0 03/26/2024 1427   CL 100 03/26/2024 1427   CO2 29 03/26/2024 1427   GLUCOSE 96 03/26/2024 1427   GLUCOSE 98 11/16/2021 0028    BUN 17 03/26/2024 1427   CREATININE 1.42 (H) 03/26/2024 1427   CALCIUM  9.9 03/26/2024 1427   GFRNONAA 44 (L) 11/16/2021 0023   GFRAA >60 02/01/2020 1622   Renal function: CrCl cannot be calculated (Patient's most recent lab result is older than the maximum 21 days allowed.).  Clinical ASCVD: No  The ASCVD Risk score (Arnett DK, et al., 2019) failed to calculate for the following reasons:   The 2019 ASCVD risk score is only valid for ages 27 to 92  A/P: Hypertension longstanding currently above goal.. BP Goal = < 130/80 mmHg. Medication adherence reported. He took his antihypertensives today. With his hx of HFmrEF, I recommend to start MRA (eplerenone ) therapy. I also discussed changing single-agent amlodipine , chlorthalidone , and valsartan  to combination Tribenzor (amlodipine -hydrochlorothiazide -olmesartan) to help with adherence. Pt prefers to stay on the single agents.  -Continue amlodipine  10 mg daily -Continue chlorthalidone  50 mg daily  -Continue carvedilol  25 mg BID -Continue hydralazine  25 mg TID -Continue valsartan  320 mg daily. -START eplerenone  25 mg daily. CMP anticipated in 2 weeks.  -Counseled on lifestyle modifications for blood pressure control including reduced dietary sodium, increased exercise, adequate sleep.  Results reviewed and written information provided.   Total time in face-to-face counseling 15 minutes.   F/U Clinic Visit w/ me in 2 weeks.  Herlene Fleeta Morris, PharmD, JAQUELINE, CPP Clinical Pharmacist [facility] & [facility] [phone]  "

## 2024-05-25 ENCOUNTER — Ambulatory Visit: Payer: Self-pay | Admitting: Pharmacist

## 2024-05-25 ENCOUNTER — Other Ambulatory Visit: Payer: Self-pay

## 2024-05-26 ENCOUNTER — Other Ambulatory Visit: Payer: Self-pay

## 2024-05-27 ENCOUNTER — Encounter (HOSPITAL_COMMUNITY): Payer: Self-pay | Admitting: Psychiatry

## 2024-05-27 ENCOUNTER — Other Ambulatory Visit: Payer: Self-pay

## 2024-05-27 ENCOUNTER — Emergency Department (HOSPITAL_COMMUNITY)
Admission: EM | Admit: 2024-05-27 | Discharge: 2024-05-27 | Disposition: A | Discharge status: Home / Self Care | Attending: Emergency Medicine | Admitting: Emergency Medicine

## 2024-05-27 DIAGNOSIS — R45851 Suicidal ideations: Secondary | ICD-10-CM | POA: Insufficient documentation

## 2024-05-27 DIAGNOSIS — D72829 Elevated white blood cell count, unspecified: Secondary | ICD-10-CM | POA: Diagnosis not present

## 2024-05-27 DIAGNOSIS — F4325 Adjustment disorder with mixed disturbance of emotions and conduct: Secondary | ICD-10-CM

## 2024-05-27 DIAGNOSIS — F1994 Other psychoactive substance use, unspecified with psychoactive substance-induced mood disorder: Secondary | ICD-10-CM

## 2024-05-27 HISTORY — DX: Depression, unspecified: F32.A

## 2024-05-27 LAB — URINE DRUG SCREEN
Amphetamines: NEGATIVE
Barbiturates: NEGATIVE
Benzodiazepines: NEGATIVE
Cocaine: NEGATIVE
Fentanyl: NEGATIVE
Methadone Scn, Ur: NEGATIVE
Opiates: NEGATIVE
Tetrahydrocannabinol: POSITIVE — AB

## 2024-05-27 LAB — CBC
HCT: 47.5 % (ref 39.0–52.0)
Hemoglobin: 15.3 g/dL (ref 13.0–17.0)
MCH: 27.6 pg (ref 26.0–34.0)
MCHC: 32.2 g/dL (ref 30.0–36.0)
MCV: 85.7 fL (ref 80.0–100.0)
Platelets: 206 K/uL (ref 150–400)
RBC: 5.54 MIL/uL (ref 4.22–5.81)
RDW: 15.7 % — ABNORMAL HIGH (ref 11.5–15.5)
WBC: 11.2 K/uL — ABNORMAL HIGH (ref 4.0–10.5)
nRBC: 0 % (ref 0.0–0.2)

## 2024-05-27 LAB — COMPREHENSIVE METABOLIC PANEL WITH GFR
ALT: 27 U/L (ref 0–44)
AST: 32 U/L (ref 15–41)
Albumin: 4.5 g/dL (ref 3.5–5.0)
Alkaline Phosphatase: 69 U/L (ref 38–126)
Anion gap: 12 (ref 5–15)
BUN: 16 mg/dL (ref 6–20)
CO2: 28 mmol/L (ref 22–32)
Calcium: 10 mg/dL (ref 8.9–10.3)
Chloride: 101 mmol/L (ref 98–111)
Creatinine, Ser: 1.22 mg/dL (ref 0.61–1.24)
GFR, Estimated: >60 mL/min
Glucose, Bld: 104 mg/dL — ABNORMAL HIGH (ref 70–99)
Potassium: 3.5 mmol/L (ref 3.5–5.1)
Sodium: 141 mmol/L (ref 135–145)
Total Bilirubin: 0.4 mg/dL (ref 0.0–1.2)
Total Protein: 8.3 g/dL — ABNORMAL HIGH (ref 6.5–8.1)

## 2024-05-27 LAB — ACETAMINOPHEN LEVEL: Acetaminophen (Tylenol), Serum: <10 ug/mL — ABNORMAL LOW (ref 10–30)

## 2024-05-27 LAB — ETHANOL: Alcohol, Ethyl (B): <15 mg/dL

## 2024-05-27 LAB — SALICYLATE LEVEL: Salicylate Lvl: <7 mg/dL — ABNORMAL LOW (ref 7.0–30.0)

## 2024-05-27 NOTE — Discharge Instructions (Signed)
 Recommend close outpatient follow-up with the behavioral health outpatient [facility] office Recommend safety plan listed below  Safety Plan Dale Mcintosh will reach out to Roommate, call 911 or call mobile crisis, or go to nearest emergency room if condition worsens or if suicidal thoughts become active Patients' will follow up with [facility] for outpatient psychiatric services (therapy/medication management).  The suicide prevention education provided includes the following: Suicide risk factors Suicide prevention and interventions National Suicide Hotline telephone number [facility] assessment telephone number [facility] Emergency Assistance 911 [facility] and/or Residential Mobile Crisis Unit telephone number Request made of family/significant other to:  Roommate  Remove weapons (e.g., guns, rifles, knives), all items previously/currently identified as safety concern.   Remove drugs/medications (over the counter, prescriptions, illicit drugs), all items previously/currently identified as a safety concern.

## 2024-05-27 NOTE — ED Notes (Addendum)
Pt dressed out and wanded by security.  
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
## 2024-05-27 NOTE — Consult Note (Addendum)
 [facility] Psychiatric Consult Initial  Patient Name: .Dale Mcintosh  MRN: [id number]  DOB: [birth year]  Consult Order details:  Orders (From admission, onward)     Start     Ordered   05/27/24 0629  CONSULT TO CALL ACT TEAM       Ordering Provider: Haze Lonni PARAS, MD  Provider:  (Not yet assigned)  Question:  Reason for Consult?  Answer:  Psych consult   05/27/24 0628             Mode of Visit: In person    Psychiatry Consult Evaluation  Service Date: May 27, 2024 LOS:  LOS: 0 days  Chief Complaint: Psych Eval.   Primary Psychiatric Diagnoses    Substance induced mood disorder (HCC) 2.    Adjustment disorder with mixed disturbance of emotions and conduct  Assessment   Dale Mcintosh is a 39 y.o. AA male with a past psychiatric history of unspecified depression, with pertinent medical comorbidities/history that include primary hypertension, OSA, and obesity, who presented this encounter by way of self, for concerns for depression and suicidal ideations, in the context of abrupt overwhelming psychosocial stress, who upon EDP evaluation, consulted psychiatry for specialty evaluation and recommendations.  Patient is voluntary at this time, as well as medically clear, per EDP team.  Upon investigation conducted, patient presents with symptomology that is most consistent with a substance-induced mood disorder and an adjustment disorder with mixed disturbance of emotions and conduct.  Evidence of this is appreciable from investigation conducted, where it is revealed that in the context of finding out that the patient's significant other has been cheating on the patient, the patient immediately just prior to this encounter, became abruptly and severely disturbed and unstable in his mental health, of which led to negative coping in the form of abusing EtOH and illicit substances just prior to coming in, of which resulted in the patient further declining, and developing a  substance-induced further decompensation of his mental health, of which led to coming in this encounter to seek help.  From investigation conducted further, there is no evidence upon conclusion that has been revealed that the patient is in imminent risk to himself or others, and given now that the patient is no longer intoxicated, and has had some time to distance himself from the abrupt psychosocial stress that he had experienced just prior to coming in, safety planning has been able to be put in place with the patient and his roommate whom he lives with, the patient is goal directed towards following up with outpatient resources, and is not presenting from exam with any endorsements of suicidal and or homicidal ideations, decompensation into psychosis, or other concerning symptomology, recommendation is for psychiatric clearance, as well as additional recommendations listed below.  Consultation recommendations and report given to primary EDP team Dr. Neysa.  Spoke with Dr. Fredia who agrees with recommendation for psychiatric clearance, as well as additional recommendations listed below.  Diagnoses:  Active Hospital problems: Principal Problem:   Substance induced mood disorder (HCC) Active Problems:   Adjustment disorder with mixed disturbance of emotions and conduct   Plan   # Substance-induced mood disorder # Adjustment disorder with mixed disturbance of emotions and conduct  ## Psychiatric Recommendations:   - Recommend close outpatient follow-up with the behavioral health outpatient [facility] office - Recommend safety plan listed below - Recommend abstain from illicit substances and etoh  Safety Plan Dale Mcintosh will reach out to Roommate, call 911 or call mobile  crisis, or go to nearest emergency room if condition worsens or if suicidal thoughts become active Patients' will follow up with [facility] for outpatient psychiatric services (therapy/medication management).  The suicide  prevention education provided includes the following: Suicide risk factors Suicide prevention and interventions National Suicide Hotline telephone number [facility] assessment telephone number [facility] Emergency Assistance 911 [facility] and/or Residential Mobile Crisis Unit telephone number Request made of family/significant other to:  Roommate  Remove weapons (e.g., guns, rifles, knives), all items previously/currently identified as safety concern.   Remove drugs/medications (over the counter, prescriptions, illicit drugs), all items previously/currently identified as a safety concern.   ## Medical Decision Making Capacity: Not specifically addressed in this encounter  ## Further Work-up: None at this time  ## Disposition:-- There are no psychiatric contraindications to discharge at this time  ## Behavioral / Environmental: -Routine agitation/safety precautions until discharge; strict adherence to safety plan upon discharge    ## Safety and Observation Level:  - Based on my clinical evaluation, I estimate the patient to be at low risk of self harm in the current setting and upon recommendation for discharge. - At this time, we recommend  routine. This decision is based on my review of the chart including patient's history and current presentation, interview of the patient, mental status examination, and consideration of suicide risk including evaluating suicidal ideation, plan, intent, suicidal or self-harm behaviors, risk factors, and protective factors. This judgment is based on our ability to directly address suicide risk, implement suicide prevention strategies, and develop a safety plan while the patient is in the clinical setting. Please contact our team if there is a concern that risk level has changed.  CSSR Risk Category:C-SSRS RISK CATEGORY: High Risk  Suicide Risk Assessment: Patient has following modifiable risk factors for suicide: lack of access to  outpatient mental health resources, active mental illness (to encompass adhd, tbi, mania, psychosis, trauma reaction), current symptoms: anxiety/panic, insomnia, impulsivity, anhedonia, hopelessness, and triggering events, which we are addressing by recommendations and resources. Patient has following non-modifiable or demographic risk factors for suicide: male gender and separation or divorce Patient has the following protective factors against suicide: Access to outpatient mental health care, Supportive family, Supportive friends, Cultural, spiritual, or religious beliefs that discourage suicide, Frustration tolerance, no history of suicide attempts, and no history of NSSIB  Thank you for this consult request. Recommendations have been communicated to the primary team.  We will sign off at this time.   Dale JINNY Gravely, NP    History of Present Illness   Dale Mcintosh is a 39 y.o. AA male with a past psychiatric history of unspecified depression, with pertinent medical comorbidities/history that include primary hypertension, OSA, and obesity, who presented this encounter by way of self, for concerns for depression and suicidal ideations, in the context of abrupt overwhelming psychosocial stress, who upon EDP evaluation, consulted psychiatry for specialty evaluation and recommendations.  Patient is voluntary at this time, as well as medically clear, per EDP team.  Patient seen today at the [facility] emergency department for face-to-face psychiatric evaluation.  Upon evaluation, patient endorses that he brought himself in this encounter seeking help for abrupt severe decline in his mental health, that manifested in the context of yesterday morning finding out that his baby gattis has been cheating on him.  Patient states that yesterday morning he went over to his baby mama's house like usual, when he states that he found her at her residence having sexual  intercourse with another man, which he  states immediately overwhelmed him, but because his stepson who is in high school and his young daughter where there, instead of getting into a big ol' altercation in front of the kids, I just said fuck it, I'm out of here, and states that he proceeded to leave his baby mama's residence, and go back to his residence, where he states that he proceeded to utilize negative coping of abusing EtOH and illicit substances of cannabis and Percocets, in hopes of, I do not know, I guess trying to just get a hold of my emotions that were running wild; he denies this was a suicide attempt.   Expanding on negative coping utilized in the form of abusing EtOH and illicit substances, patient endorses that just prior to coming in he drank 4 mini 2 oz. bottles of liquor that he bought from the store, with last use of alcohol being around 10 or 11 PM, took 2 Percocets he had from an old prescription he had around his residence previously for his back pain, also around 10 or 11pm, and states that he proceeded to like he usually does he states, smoke marijuana and cigarettes for several hours, until finally after ruminating for several hours on the recent events that transpired, states that he, I just finally got fed up with everything and said I will just go and see if I can get some help to just talk to someone, which he states led to him coming in by way of self.  BAL unremarkable, UDS positive for cannabis.  Does not appear intoxicated on EtOH and/or illicit substances endorsed to have utilized.  Patient endorses that he does not regularly drink EtOH, but endorses that in the context of the recent events that transpired, states that because he does not have anyone currently to talk to, states that, I did not really know what else to do, I need a therapist.  Patient endorses he does not regularly take Percocet or any other drugs, but does notably state that he abuses cannabis heavily, as well as abuses tobacco he  states, both in the form of daily heavy use, with both starting around the age of 46 he states.   Patient endorses that he is not suicidal and or homicidal, and when asked about previous endorsements of suicidal ideations, patient endorses that he was, sort of feeling like I wish I could just die from how I was feeling with everything, but states that now that he is no longer intoxicated, and he has had positive assistance he states from being taken care of in the emergency department, states that he feels very hopeful with moving forward with resources to get involved and start therapy.  Patient endorses no history of suicide attempts and/or self-injurious behavior.  Patient endorses that he does not have any history of psychiatric problems, but does endorse that his primary care provider might have given him the diagnosis of unspecified depression, because he has endorsed that in the past he has been stressed around providing for his family.  Patient orientation is intact, no concerns fluctuations of consciousness.  Patient presents with no auditory or visual hallucinations, and objectively, does not appear to be presenting with psychotic features.  Patient endorses he has difficulty with sleeping due to OSA, but endorses that he is eating normal.  Patient denies largely all depressive and/or anxious symptomology, but reiterates numerous times that he is experiencing stress.  Patient endorses his mood as, still pretty stressed  about everything, but I'm going to be alright, and presents with a neutral affect with stressed edge, a congruent interpersonal style, with fair eye contact.  Patient endorses that he has support from his roommate, as well as he and his now ex significant other live separately from each other.  Discussed with patient that given investigation conducted, as well as reassurances through safety planning that the patient can be successful upon psychiatric clearance and following up with  the recommendations above, discussed that recommendation would be for discharge, to which patient endorsed that he was amenable to this, and endorsed no safety concerns.  Roommate, collateral  Roommate states with patient that he can adhere to safety plan discussed for safe discharge. No safety concerns helping patient.   Review of Systems  Constitutional:  Positive for malaise/fatigue.  Cardiovascular:  Negative for chest pain.  Gastrointestinal:  Negative for abdominal pain, constipation, diarrhea, nausea and vomiting.  Neurological:  Negative for dizziness, tingling, tremors, seizures, loss of consciousness, weakness and headaches.  Psychiatric/Behavioral:  Positive for substance abuse (Liquor and THC just prior to coming in). Negative for depression, hallucinations and suicidal ideas. The patient has insomnia. The patient is not nervous/anxious.   All other systems reviewed and are negative.    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 reported Living Situation: Lives with himself and roommate Spiritual Hx: Christian Access to weapons/lethal means: Denies  Substance History Alcohol: Yes, just prior to coming in, for miniature bottles of liquor Type of alcohol liquor Last Drink around 10 or 11 PM yesterday History of alcohol withdrawal seizures denies History of DT's denies Tobacco: Daily since the age of 29, about a pack a day Illicit drugs: Cannabis Prescription drug abuse: Percocets Rehab hx: None  Exam Findings  Physical Exam: As  below Vital Signs:  Temp:  [98.4 F (36.9 C)] 98.4 F (36.9 C) (01/22 0524) Pulse Rate:  [107] 107 (01/22 0524) Resp:  [18] 18 (01/22 0524) BP: (164)/(109) 164/109 (01/22 0524) SpO2:  [97 %] 97 % (01/22 0524) Blood pressure (!) 164/109, pulse (!) 107, temperature 98.4 F (36.9 C), resp. rate 18, SpO2 97%. There is no height or weight on file to calculate BMI.  Physical Exam Vitals and nursing note reviewed.  Constitutional:      General: He is not in acute distress.    Appearance: He is obese. He is not ill-appearing, toxic-appearing or diaphoretic.     Comments: Obese AA male with mildly stressed interpersonal style   Pulmonary:     Effort: Pulmonary effort is normal.  Skin:    General: Skin is warm and dry.  Neurological:     Mental Status: He is alert and oriented to person, place, and time.     Motor: No weakness, tremor or seizure activity.  Psychiatric:        Attention and Perception: Attention and perception normal. He does not perceive auditory or visual hallucinations.        Speech: Speech normal.        Behavior: Behavior is cooperative.        Thought Content: Thought content normal. Thought content  is not paranoid or delusional. Thought content does not include homicidal or suicidal ideation.        Cognition and Memory: Cognition and memory normal.        Judgment: Judgment normal.     Comments: Mood: still pretty stressed about everything, but I'm going to be alright Affect: Neutral with stressed edge      Mental Status Exam: General Appearance: Obese African-American male with stressed interpersonal style with largely fair grooming and hygiene  Orientation:  Full (Time, Place, and Person)  Memory:  WDL  Concentration:  Concentration: Fair and Attention Span: Fair  Recall:  Fair  Attention  Fair  Eye Contact:  Fair  Speech:  Clear and Coherent and Normal Rate  Language:  Fair  Volume:  Normal  Mood:  still pretty stressed about everything, but I'm  going to be alright  Affect: Neutral with stressed edge  Thought Process:  Coherent, Goal Directed, and Linear  Thought Content:  Logical  Suicidal Thoughts:  No  Homicidal Thoughts:  No  Judgement:  Intact  Insight:  Present  Psychomotor Activity:  Normal  Akathisia:  No  Fund of Knowledge:  Fair      Assets:  Manufacturing Systems Engineer Desire for Improvement Financial Resources/Insurance Housing Leisure Time Physical Health Resilience Social Support Talents/Skills Transportation Vocational/Educational  Cognition:  WNL  ADL's:  Intact  AIMS (if indicated):   0     Other History   These have been pulled in through the EMR, reviewed, and updated if appropriate.  Family History:  The patient's family history includes Diabetes in his father; Hypertension in his father and mother.  Medical History: Past Medical History:  Diagnosis Date   Depression    Hyperlipidemia    Hypertension    Nonischemic cardiomyopathy (HCC)    a. Echo 4/20220: LVEF of 40-45% (coronary CTA at the time showed coronary calcium  score of 0 and normal coronaries) - felt to be due to uncontrolled hypertension   Obesity    OSA (obstructive sleep apnea)    Tobacco abuse     Surgical History: Past Surgical History:  Procedure Laterality Date   NO PAST SURGERIES     None       Medications:  Current Medications[1]  Allergies: Allergies[2]  Dale JINNY Gravely, NP      [1] No current facility-administered medications for this encounter.  Current Outpatient Medications:    amLODipine  (NORVASC ) 10 MG tablet, Take 1 tablet (10 mg total) by mouth daily., Disp: 90 tablet, Rfl: 1   carvedilol  (COREG ) 25 MG tablet, Take 1 tablet (25 mg total) by mouth 2 (two) times daily with a meal., Disp: 180 tablet, Rfl: 1   chlorthalidone  (HYGROTON ) 50 MG tablet, Take 1 tablet (50 mg total) by mouth daily., Disp: 90 tablet, Rfl: 1   eplerenone  (INSPRA ) 25 MG tablet, Take 1 tablet (25 mg total) by mouth daily.  (Patient not taking: Reported on 05/27/2024), Disp: 30 tablet, Rfl: 2   hydrALAZINE  (APRESOLINE ) 25 MG tablet, Take 1 tablet (25 mg total) by mouth 3 (three) times daily., Disp: 180 tablet, Rfl: 1   hydrOXYzine  (ATARAX ) 25 MG tablet, Take 1-2 tablets (25-50 mg total) by mouth at bedtime., Disp: 60 tablet, Rfl: 0   metFORMIN  (GLUCOPHAGE ) 500 MG tablet, Take 1 tablet (500 mg total) by mouth 2 (two) times daily with a meal., Disp: 180 tablet, Rfl: 1   valsartan  (DIOVAN ) 320 MG tablet, Take 1 tablet (320 mg total) by mouth daily., Disp:  90 tablet, Rfl: 1 [2]  Allergies Allergen Reactions   Bee Venom Anaphylaxis   Shrimp [Shellfish Allergy] Itching

## 2024-05-27 NOTE — ED Notes (Signed)
 PT IS VOLUNTARY NOT IVC FOR NOW

## 2024-05-27 NOTE — ED Notes (Signed)
 Belongings and valuables returned from locker and security and given back to pt.

## 2024-05-27 NOTE — ED Notes (Signed)
 Noted $800 cash along with 2 gold necklaces put into bio bag with patient label on bag given to security

## 2024-05-27 NOTE — ED Triage Notes (Addendum)
 Patient reports feeling depressed with suicidal ideation , did not disclose his plan of suicide at triage  , no hallucinations .

## 2024-05-27 NOTE — ED Provider Notes (Signed)
 " [place] AT [facility] Provider Note   CSN: [id number] Arrival date & time: 05/27/24  9479     Patient presents with: Suicidal   Dale Mcintosh is a 39 y.o. male.   Patient presents to the emergency department for psychiatric valuation.  Patient reports that his mother died recently and he has not grieved.  He then reports that his significant other cheated on him and he just found out.  Patient feeling suicidal.       Prior to Admission medications  Medication Sig Start Date End Date Taking? Authorizing Provider  amLODipine  (NORVASC ) 10 MG tablet Take 1 tablet (10 mg total) by mouth daily. 02/12/24   Brien Belvie BRAVO, MD  carvedilol  (COREG ) 25 MG tablet Take 1 tablet (25 mg total) by mouth 2 (two) times daily with a meal. 02/12/24   Brien Belvie BRAVO, MD  chlorthalidone  (HYGROTON ) 50 MG tablet Take 1 tablet (50 mg total) by mouth daily. 02/12/24   Brien Belvie BRAVO, MD  eplerenone  (INSPRA ) 25 MG tablet Take 1 tablet (25 mg total) by mouth daily. 05/11/24   Newlin, Enobong, MD  hydrALAZINE  (APRESOLINE ) 25 MG tablet Take 1 tablet (25 mg total) by mouth 3 (three) times daily. 02/12/24   Brien Belvie BRAVO, MD  hydrOXYzine  (ATARAX ) 25 MG tablet Take 1-2 tablets (25-50 mg total) by mouth at bedtime. Patient not taking: Reported on 03/30/2024 03/26/24   Fleming, Zelda W, NP  meloxicam  (MOBIC ) 15 MG tablet Take 1 tablet (15 mg total) by mouth daily. RIGHT KNEE PAIN Patient not taking: Reported on 03/30/2024 03/26/24   Fleming, Zelda W, NP  metFORMIN  (GLUCOPHAGE ) 500 MG tablet Take 1 tablet (500 mg total) by mouth 2 (two) times daily with a meal. 02/12/24   Brien Belvie BRAVO, MD  valsartan  (DIOVAN ) 320 MG tablet Take 1 tablet (320 mg total) by mouth daily. 02/12/24   Brien Belvie BRAVO, MD  hydrochlorothiazide  (HYDRODIURIL ) 25 MG tablet Take 1 tablet (25 mg total) by mouth daily. 06/27/20 06/14/22  Jaycee Greig PARAS, NP  losartan  (COZAAR ) 100 MG tablet Take 1 tablet (100  mg total) by mouth daily. 06/27/20 06/14/22  Jaycee Greig PARAS, NP  nitroGLYCERIN  (NITROSTAT ) 0.4 MG SL tablet Place 1 tablet (0.4 mg total) under the tongue every 5 (five) minutes as needed for chest pain (per CT heart protocol). 08/29/18 06/14/22  Cheryle Page, MD    Allergies: Bee venom, Bee venom, Shrimp (diagnostic), and Shrimp [shellfish allergy]    Review of Systems  Updated Vital Signs BP (!) 164/109 (BP Location: Right Arm)   Pulse (!) 107   Temp 98.4 F (36.9 C)   Resp 18   SpO2 97%   Physical Exam Vitals and nursing note reviewed.  Constitutional:      General: He is not in acute distress.    Appearance: He is well-developed.  HENT:     Head: Normocephalic and atraumatic.     Mouth/Throat:     Mouth: Mucous membranes are moist.  Eyes:     General: Vision grossly intact. Gaze aligned appropriately.     Extraocular Movements: Extraocular movements intact.     Conjunctiva/sclera: Conjunctivae normal.  Cardiovascular:     Rate and Rhythm: Normal rate and regular rhythm.     Pulses: Normal pulses.     Heart sounds: Normal heart sounds, S1 normal and S2 normal. No murmur heard.    No friction rub. No gallop.  Pulmonary:     Effort: Pulmonary effort  is normal. No respiratory distress.     Breath sounds: Normal breath sounds.  Abdominal:     Palpations: Abdomen is soft.     Tenderness: There is no abdominal tenderness. There is no guarding or rebound.     Hernia: No hernia is present.  Musculoskeletal:        General: No swelling.     Cervical back: Full passive range of motion without pain, normal range of motion and neck supple. No pain with movement, spinous process tenderness or muscular tenderness. Normal range of motion.     Right lower leg: No edema.     Left lower leg: No edema.  Skin:    General: Skin is warm and dry.     Capillary Refill: Capillary refill takes less than 2 seconds.     Findings: No ecchymosis, erythema, lesion or wound.  Neurological:      Mental Status: He is alert and oriented to person, place, and time.     GCS: GCS eye subscore is 4. GCS verbal subscore is 5. GCS motor subscore is 6.     Cranial Nerves: Cranial nerves 2-12 are intact.     Sensory: Sensation is intact.     Motor: Motor function is intact. No weakness or abnormal muscle tone.     Coordination: Coordination is intact.  Psychiatric:        Mood and Affect: Mood is depressed.        Speech: Speech normal.        Behavior: Behavior is withdrawn. Behavior is cooperative.        Thought Content: Thought content includes suicidal ideation.     (all labs ordered are listed, but only abnormal results are displayed) Labs Reviewed  COMPREHENSIVE METABOLIC PANEL WITH GFR - Abnormal; Notable for the following components:      Result Value   Glucose, Bld 104 (*)    Total Protein 8.3 (*)    All other components within normal limits  CBC - Abnormal; Notable for the following components:   WBC 11.2 (*)    RDW 15.7 (*)    All other components within normal limits  ACETAMINOPHEN  LEVEL - Abnormal; Notable for the following components:   Acetaminophen  (Tylenol ), Serum <10 (*)    All other components within normal limits  SALICYLATE LEVEL - Abnormal; Notable for the following components:   Salicylate Lvl <7.0 (*)    All other components within normal limits  ETHANOL  URINE DRUG SCREEN    EKG: None  Radiology: No results found.   Procedures   Medications Ordered in the ED - No data to display                                  Medical Decision Making Amount and/or Complexity of Data Reviewed Labs: ordered.   Patient presents to the emergency department with suicidal ideation.  Patient has multiple social stressors currently.  Patient asking to talk to someone.  He reports that he has been drinking tonight but does not appear significantly intoxicated at this time.  Patient will need to be evaluated by behavioral health.  Patient medically clear for  evaluation.     Final diagnoses:  Suicidal ideation    ED Discharge Orders     None          Tresha Muzio, Lonni PARAS, MD 05/27/24 [phone]  "

## 2024-06-28 ENCOUNTER — Ambulatory Visit: Admitting: Nurse Practitioner
